# Patient Record
Sex: Female | Born: 1950 | Race: White | Hispanic: No | Marital: Married | Smoking: Never smoker
Health system: Southern US, Community
[De-identification: ages and names within clinical notes are randomized; demographics above are authoritative.]

## PROBLEM LIST (undated history)

## (undated) DIAGNOSIS — E7800 Pure hypercholesterolemia, unspecified: Secondary | ICD-10-CM

## (undated) DIAGNOSIS — G43909 Migraine, unspecified, not intractable, without status migrainosus: Secondary | ICD-10-CM

## (undated) DIAGNOSIS — E785 Hyperlipidemia, unspecified: Secondary | ICD-10-CM

## (undated) DIAGNOSIS — F329 Major depressive disorder, single episode, unspecified: Secondary | ICD-10-CM

## (undated) DIAGNOSIS — Z9049 Acquired absence of other specified parts of digestive tract: Secondary | ICD-10-CM

## (undated) DIAGNOSIS — G44209 Tension-type headache, unspecified, not intractable: Secondary | ICD-10-CM

## (undated) DIAGNOSIS — K219 Gastro-esophageal reflux disease without esophagitis: Secondary | ICD-10-CM

## (undated) DIAGNOSIS — G709 Myoneural disorder, unspecified: Secondary | ICD-10-CM

## (undated) DIAGNOSIS — M84453A Pathological fracture, unspecified femur, initial encounter for fracture: Secondary | ICD-10-CM

## (undated) DIAGNOSIS — M797 Fibromyalgia: Secondary | ICD-10-CM

## (undated) DIAGNOSIS — Z9289 Personal history of other medical treatment: Secondary | ICD-10-CM

## (undated) DIAGNOSIS — I10 Essential (primary) hypertension: Secondary | ICD-10-CM

## (undated) DIAGNOSIS — M1710 Unilateral primary osteoarthritis, unspecified knee: Secondary | ICD-10-CM

## (undated) DIAGNOSIS — I82409 Acute embolism and thrombosis of unspecified deep veins of unspecified lower extremity: Secondary | ICD-10-CM

## (undated) DIAGNOSIS — F32A Depression, unspecified: Secondary | ICD-10-CM

## (undated) DIAGNOSIS — S83249A Other tear of medial meniscus, current injury, unspecified knee, initial encounter: Secondary | ICD-10-CM

## (undated) DIAGNOSIS — R079 Chest pain, unspecified: Secondary | ICD-10-CM

## (undated) DIAGNOSIS — IMO0001 Reserved for inherently not codable concepts without codable children: Secondary | ICD-10-CM

## (undated) DIAGNOSIS — R1310 Dysphagia, unspecified: Secondary | ICD-10-CM

## (undated) DIAGNOSIS — E669 Obesity, unspecified: Secondary | ICD-10-CM

## (undated) DIAGNOSIS — L570 Actinic keratosis: Secondary | ICD-10-CM

## (undated) DIAGNOSIS — N3281 Overactive bladder: Secondary | ICD-10-CM

## (undated) DIAGNOSIS — K5792 Diverticulitis of intestine, part unspecified, without perforation or abscess without bleeding: Secondary | ICD-10-CM

## (undated) HISTORY — DX: Migraine, unspecified, not intractable, without status migrainosus: G43.909

## (undated) HISTORY — DX: Reserved for inherently not codable concepts without codable children: IMO0001

## (undated) HISTORY — DX: Personal history of other medical treatment: Z92.89

## (undated) HISTORY — DX: Other tear of medial meniscus, current injury, unspecified knee, initial encounter: S83.249A

## (undated) HISTORY — DX: Major depressive disorder, single episode, unspecified: F32.9

## (undated) HISTORY — PX: CARDIAC CATHETERIZATION: SHX172

## (undated) HISTORY — DX: Pure hypercholesterolemia, unspecified: E78.00

## (undated) HISTORY — DX: Myoneural disorder, unspecified: G70.9

## (undated) HISTORY — PX: ABDOMINAL HYSTERECTOMY: SHX81

## (undated) HISTORY — DX: Essential (primary) hypertension: I10

## (undated) HISTORY — DX: Dysphagia, unspecified: R13.10

## (undated) HISTORY — DX: Depression, unspecified: F32.A

## (undated) HISTORY — PX: SHOULDER SURGERY: SHX246

## (undated) HISTORY — DX: Chest pain, unspecified: R07.9

## (undated) HISTORY — DX: Tension-type headache, unspecified, not intractable: G44.209

## (undated) HISTORY — DX: Fibromyalgia: M79.7

## (undated) HISTORY — DX: Hyperlipidemia, unspecified: E78.5

## (undated) HISTORY — DX: Gastro-esophageal reflux disease without esophagitis: K21.9

## (undated) HISTORY — DX: Acquired absence of other specified parts of digestive tract: Z90.49

## (undated) HISTORY — DX: Diverticulitis of intestine, part unspecified, without perforation or abscess without bleeding: K57.92

## (undated) HISTORY — DX: Actinic keratosis: L57.0

## (undated) HISTORY — DX: Acute embolism and thrombosis of unspecified deep veins of unspecified lower extremity: I82.409

## (undated) HISTORY — DX: Pathological fracture, unspecified femur, initial encounter for fracture: M84.453A

## (undated) HISTORY — PX: COLON RESECTION: SHX5231

## (undated) HISTORY — DX: Overactive bladder: N32.81

## (undated) HISTORY — PX: KNEE SURGERY: SHX244

## (undated) HISTORY — DX: Obesity, unspecified: E66.9

## (undated) HISTORY — DX: Unilateral primary osteoarthritis, unspecified knee: M17.10

## (undated) HISTORY — PX: REVISION TOTAL HIP ARTHROPLASTY: SHX766

## (undated) HISTORY — PX: BREAST BIOPSY: SHX20

---

## 1993-07-06 HISTORY — PX: CARDIAC CATHETERIZATION: SHX172

## 1999-07-07 HISTORY — PX: CARDIAC CATHETERIZATION: SHX172

## 2004-04-05 ENCOUNTER — Encounter: Payer: Self-pay | Admitting: Orthopedic Surgery

## 2004-05-06 ENCOUNTER — Encounter: Payer: Self-pay | Admitting: Orthopedic Surgery

## 2004-07-06 HISTORY — PX: CARDIAC CATHETERIZATION: SHX172

## 2004-09-23 ENCOUNTER — Inpatient Hospital Stay (HOSPITAL_COMMUNITY): Admission: EM | Admit: 2004-09-23 | Discharge: 2004-09-24 | Payer: Self-pay | Admitting: *Deleted

## 2004-09-23 ENCOUNTER — Ambulatory Visit: Payer: Self-pay | Admitting: Cardiovascular Disease

## 2004-09-27 ENCOUNTER — Inpatient Hospital Stay (HOSPITAL_COMMUNITY): Admission: EM | Admit: 2004-09-27 | Discharge: 2004-10-01 | Payer: Self-pay | Admitting: Emergency Medicine

## 2004-10-22 ENCOUNTER — Ambulatory Visit: Payer: Self-pay | Admitting: Cardiology

## 2005-08-12 ENCOUNTER — Inpatient Hospital Stay: Payer: Self-pay | Admitting: Internal Medicine

## 2005-10-19 ENCOUNTER — Ambulatory Visit: Payer: Self-pay | Admitting: Unknown Physician Specialty

## 2006-08-13 ENCOUNTER — Other Ambulatory Visit: Payer: Self-pay

## 2006-08-13 ENCOUNTER — Inpatient Hospital Stay: Payer: Self-pay | Admitting: Internal Medicine

## 2006-08-30 ENCOUNTER — Ambulatory Visit: Payer: Self-pay | Admitting: Gastroenterology

## 2006-09-03 ENCOUNTER — Ambulatory Visit: Payer: Self-pay | Admitting: Gastroenterology

## 2007-09-21 ENCOUNTER — Other Ambulatory Visit: Payer: Self-pay

## 2007-09-21 ENCOUNTER — Inpatient Hospital Stay: Payer: Self-pay | Admitting: Internal Medicine

## 2007-09-26 ENCOUNTER — Ambulatory Visit: Payer: Self-pay | Admitting: Unknown Physician Specialty

## 2007-12-14 ENCOUNTER — Ambulatory Visit: Payer: Self-pay | Admitting: Specialist

## 2007-12-21 ENCOUNTER — Ambulatory Visit: Payer: Self-pay | Admitting: Specialist

## 2008-02-24 ENCOUNTER — Other Ambulatory Visit: Payer: Self-pay

## 2008-02-25 ENCOUNTER — Inpatient Hospital Stay: Payer: Self-pay | Admitting: Internal Medicine

## 2009-09-09 ENCOUNTER — Ambulatory Visit: Payer: Self-pay | Admitting: Internal Medicine

## 2010-07-05 ENCOUNTER — Ambulatory Visit: Payer: Self-pay | Admitting: Internal Medicine

## 2010-07-05 ENCOUNTER — Emergency Department: Payer: Self-pay | Admitting: Emergency Medicine

## 2010-07-10 ENCOUNTER — Emergency Department: Payer: Self-pay | Admitting: Psychiatry

## 2010-11-29 ENCOUNTER — Emergency Department: Payer: Self-pay | Admitting: Emergency Medicine

## 2010-12-15 ENCOUNTER — Emergency Department: Payer: Self-pay | Admitting: Unknown Physician Specialty

## 2011-01-16 ENCOUNTER — Ambulatory Visit: Payer: Self-pay | Admitting: Unknown Physician Specialty

## 2011-01-20 LAB — PATHOLOGY REPORT

## 2011-04-06 DIAGNOSIS — N3281 Overactive bladder: Secondary | ICD-10-CM | POA: Insufficient documentation

## 2011-04-06 DIAGNOSIS — M797 Fibromyalgia: Secondary | ICD-10-CM | POA: Insufficient documentation

## 2011-04-06 DIAGNOSIS — E782 Mixed hyperlipidemia: Secondary | ICD-10-CM | POA: Insufficient documentation

## 2011-04-06 DIAGNOSIS — G44209 Tension-type headache, unspecified, not intractable: Secondary | ICD-10-CM | POA: Insufficient documentation

## 2011-04-06 DIAGNOSIS — F32A Depression, unspecified: Secondary | ICD-10-CM | POA: Insufficient documentation

## 2011-04-06 DIAGNOSIS — E1169 Type 2 diabetes mellitus with other specified complication: Secondary | ICD-10-CM | POA: Insufficient documentation

## 2011-04-06 DIAGNOSIS — F329 Major depressive disorder, single episode, unspecified: Secondary | ICD-10-CM | POA: Insufficient documentation

## 2011-04-06 DIAGNOSIS — G43909 Migraine, unspecified, not intractable, without status migrainosus: Secondary | ICD-10-CM | POA: Insufficient documentation

## 2011-05-17 ENCOUNTER — Observation Stay: Payer: Self-pay | Admitting: Internal Medicine

## 2011-12-21 ENCOUNTER — Ambulatory Visit: Payer: Self-pay | Admitting: Family Medicine

## 2011-12-22 DIAGNOSIS — M7062 Trochanteric bursitis, left hip: Secondary | ICD-10-CM | POA: Insufficient documentation

## 2012-01-14 DIAGNOSIS — M25559 Pain in unspecified hip: Secondary | ICD-10-CM | POA: Insufficient documentation

## 2012-02-24 ENCOUNTER — Ambulatory Visit: Payer: Self-pay | Admitting: Family Medicine

## 2012-04-12 DIAGNOSIS — G709 Myoneural disorder, unspecified: Secondary | ICD-10-CM | POA: Insufficient documentation

## 2012-06-13 ENCOUNTER — Ambulatory Visit: Payer: Self-pay | Admitting: Family Medicine

## 2012-06-13 LAB — COMPREHENSIVE METABOLIC PANEL
Albumin: 3.2 g/dL — ABNORMAL LOW (ref 3.4–5.0)
Alkaline Phosphatase: 120 U/L (ref 50–136)
Calcium, Total: 9.3 mg/dL (ref 8.5–10.1)
EGFR (Non-African Amer.): >60
Glucose: 111 mg/dL — ABNORMAL HIGH (ref 65–99)
SGOT(AST): 21 U/L (ref 15–37)
SGPT (ALT): 32 U/L (ref 12–78)

## 2012-06-13 LAB — CBC WITH DIFFERENTIAL/PLATELET
Basophil #: 0.1 10*3/uL (ref 0.0–0.1)
Eosinophil #: 0.1 10*3/uL (ref 0.0–0.7)
Lymphocyte #: 2 10*3/uL (ref 1.0–3.6)
MCHC: 34.4 g/dL (ref 32.0–36.0)
MCV: 92 fL (ref 80–100)
Monocyte %: 6.1 %
Neutrophil #: 14.6 10*3/uL — ABNORMAL HIGH (ref 1.4–6.5)
Platelet: 245 10*3/uL (ref 150–440)
RDW: 13.7 % (ref 11.5–14.5)
WBC: 17.9 10*3/uL — ABNORMAL HIGH (ref 3.6–11.0)

## 2012-06-13 LAB — LIPASE, BLOOD: Lipase: 117 U/L (ref 73–393)

## 2012-06-14 ENCOUNTER — Inpatient Hospital Stay: Payer: Self-pay | Admitting: Surgery

## 2012-06-15 LAB — BASIC METABOLIC PANEL
Anion Gap: 7 (ref 7–16)
BUN: 6 mg/dL — ABNORMAL LOW (ref 7–18)
Chloride: 107 mmol/L (ref 98–107)
Creatinine: 0.91 mg/dL (ref 0.60–1.30)
EGFR (Non-African Amer.): >60
Glucose: 146 mg/dL — ABNORMAL HIGH (ref 65–99)
Osmolality: 283 (ref 275–301)
Potassium: 4.8 mmol/L (ref 3.5–5.1)

## 2012-06-15 LAB — CBC WITH DIFFERENTIAL/PLATELET
Basophil %: 0.5 %
Eosinophil %: 1.1 %
HGB: 12.5 g/dL (ref 12.0–16.0)
MCH: 32.2 pg (ref 26.0–34.0)
Monocyte #: 0.8 10*3/uL (ref 0.2–1.0)
Neutrophil %: 74.1 %
RBC: 3.86 10*6/uL — ABNORMAL LOW (ref 4.40–5.90)

## 2012-06-16 LAB — CBC WITH DIFFERENTIAL/PLATELET
Basophil %: 0.5 %
Eosinophil #: 0.1 10*3/uL (ref 0.0–0.7)
Eosinophil %: 1.1 %
HGB: 12.2 g/dL (ref 12.0–16.0)
Lymphocyte %: 11.9 %
MCHC: 33.8 g/dL (ref 32.0–36.0)
Neutrophil %: 78.7 %
RBC: 3.91 10*6/uL — ABNORMAL LOW (ref 4.40–5.90)
WBC: 11.9 10*3/uL — ABNORMAL HIGH (ref 3.8–10.6)

## 2012-06-17 LAB — CBC WITH DIFFERENTIAL/PLATELET
Basophil #: 0.1 10*3/uL (ref 0.0–0.1)
Eosinophil #: 0.2 10*3/uL (ref 0.0–0.7)
Eosinophil %: 1.8 %
MCH: 32.9 pg (ref 26.0–34.0)
Monocyte #: 0.6 10*3/uL (ref 0.2–1.0)
Monocyte %: 7.7 %
Neutrophil %: 68.9 %
Platelet: 256 10*3/uL (ref 150–440)
RBC: 3.83 10*6/uL — ABNORMAL LOW (ref 4.40–5.90)
RDW: 13.8 % (ref 11.5–14.5)

## 2012-06-17 LAB — BASIC METABOLIC PANEL
Anion Gap: 4 — ABNORMAL LOW (ref 7–16)
Calcium, Total: 8.6 mg/dL (ref 8.5–10.1)
Co2: 28 mmol/L (ref 21–32)
Creatinine: 0.75 mg/dL (ref 0.60–1.30)

## 2012-06-18 LAB — CBC WITH DIFFERENTIAL/PLATELET
Basophil %: 0.2 %
Eosinophil #: 0 10*3/uL (ref 0.0–0.7)
Eosinophil %: 0.2 %
HGB: 12.8 g/dL (ref 12.0–16.0)
Lymphocyte #: 0.9 10*3/uL — ABNORMAL LOW (ref 1.0–3.6)
Lymphocyte %: 8.1 %
MCHC: 34.8 g/dL (ref 32.0–36.0)
Monocyte #: 0.7 10*3/uL (ref 0.2–1.0)
Neutrophil #: 9.4 10*3/uL — ABNORMAL HIGH (ref 1.4–6.5)
Neutrophil %: 85.2 %
Platelet: 268 10*3/uL (ref 150–440)
RDW: 14 % (ref 11.5–14.5)

## 2012-06-18 LAB — BASIC METABOLIC PANEL
Anion Gap: 7 (ref 7–16)
BUN: 3 mg/dL — ABNORMAL LOW (ref 7–18)
Chloride: 107 mmol/L (ref 98–107)
Creatinine: 0.82 mg/dL (ref 0.60–1.30)
Glucose: 154 mg/dL — ABNORMAL HIGH (ref 65–99)
Potassium: 4.1 mmol/L (ref 3.5–5.1)

## 2012-06-19 LAB — CBC WITH DIFFERENTIAL/PLATELET
Basophil #: 0.1 10*3/uL (ref 0.0–0.1)
Lymphocyte %: 9.2 %
Monocyte %: 7 %
Platelet: 263 10*3/uL (ref 150–440)
RDW: 14 % (ref 11.5–14.5)
WBC: 16.9 10*3/uL — ABNORMAL HIGH (ref 3.8–10.6)

## 2012-06-21 LAB — BASIC METABOLIC PANEL
Anion Gap: 5 — ABNORMAL LOW (ref 7–16)
Calcium, Total: 8.5 mg/dL (ref 8.5–10.1)
Chloride: 106 mmol/L (ref 98–107)
Co2: 28 mmol/L (ref 21–32)
Creatinine: 0.7 mg/dL (ref 0.60–1.30)

## 2012-06-21 LAB — CBC WITH DIFFERENTIAL/PLATELET
Basophil %: 0.5 %
Eosinophil #: 0.5 10*3/uL (ref 0.0–0.7)
HCT: 31.3 % — ABNORMAL LOW (ref 40.0–52.0)
HGB: 11.2 g/dL — ABNORMAL LOW (ref 12.0–16.0)
Lymphocyte #: 1.1 10*3/uL (ref 1.0–3.6)
MCH: 33.1 pg (ref 26.0–34.0)
MCHC: 35.7 g/dL (ref 32.0–36.0)
Neutrophil #: 8.5 10*3/uL — ABNORMAL HIGH (ref 1.4–6.5)

## 2012-06-23 LAB — CBC WITH DIFFERENTIAL/PLATELET
Basophil %: 0.7 %
Eosinophil #: 0.5 10*3/uL (ref 0.0–0.7)
Eosinophil %: 7 %
HCT: 32.6 % — ABNORMAL LOW (ref 40.0–52.0)
HGB: 11.1 g/dL — ABNORMAL LOW (ref 12.0–16.0)
MCH: 31.3 pg (ref 26.0–34.0)
MCHC: 34 g/dL (ref 32.0–36.0)
MCV: 92 fL (ref 80–100)
Monocyte #: 0.7 10*3/uL (ref 0.2–1.0)
Neutrophil #: 4.5 10*3/uL (ref 1.4–6.5)
RBC: 3.54 10*6/uL — ABNORMAL LOW (ref 4.40–5.90)

## 2012-06-23 LAB — BASIC METABOLIC PANEL
Anion Gap: 7 (ref 7–16)
BUN: 2 mg/dL — ABNORMAL LOW (ref 7–18)
Calcium, Total: 8.7 mg/dL (ref 8.5–10.1)
Glucose: 113 mg/dL — ABNORMAL HIGH (ref 65–99)
Osmolality: 278 (ref 275–301)
Potassium: 4 mmol/L (ref 3.5–5.1)

## 2012-07-13 ENCOUNTER — Inpatient Hospital Stay: Payer: Self-pay | Admitting: Surgery

## 2012-07-13 LAB — TSH: Thyroid Stimulating Horm: 1.02 u[IU]/mL

## 2012-07-13 LAB — PROTIME-INR: Prothrombin Time: 13.8 secs (ref 11.5–14.7)

## 2012-07-14 LAB — BASIC METABOLIC PANEL
Anion Gap: 9 (ref 7–16)
BUN: 11 mg/dL (ref 7–18)
Calcium, Total: 8.7 mg/dL (ref 8.5–10.1)
Creatinine: 0.8 mg/dL (ref 0.60–1.30)
Osmolality: 285 (ref 275–301)
Sodium: 142 mmol/L (ref 136–145)

## 2012-07-14 LAB — CBC WITH DIFFERENTIAL/PLATELET
Basophil %: 0.8 %
Eosinophil #: 0.7 10*3/uL (ref 0.0–0.7)
Eosinophil %: 8.9 %
HCT: 37.2 % — ABNORMAL LOW (ref 40.0–52.0)
Lymphocyte #: 1.6 10*3/uL (ref 1.0–3.6)
MCH: 30.8 pg (ref 26.0–34.0)
Platelet: 189 10*3/uL (ref 150–440)

## 2012-07-26 DIAGNOSIS — I82409 Acute embolism and thrombosis of unspecified deep veins of unspecified lower extremity: Secondary | ICD-10-CM | POA: Insufficient documentation

## 2012-07-26 DIAGNOSIS — K5792 Diverticulitis of intestine, part unspecified, without perforation or abscess without bleeding: Secondary | ICD-10-CM | POA: Insufficient documentation

## 2012-08-24 ENCOUNTER — Other Ambulatory Visit: Payer: Self-pay | Admitting: Surgery

## 2012-08-24 LAB — URINALYSIS, COMPLETE
Ketone: NEGATIVE
Ph: 5 (ref 4.5–8.0)
RBC,UR: 1 /HPF (ref 0–5)
Squamous Epithelial: <1
WBC UR: 2 /HPF (ref 0–5)

## 2013-07-25 DIAGNOSIS — Z9049 Acquired absence of other specified parts of digestive tract: Secondary | ICD-10-CM | POA: Insufficient documentation

## 2013-08-28 ENCOUNTER — Ambulatory Visit (INDEPENDENT_AMBULATORY_CARE_PROVIDER_SITE_OTHER): Payer: Medicare Other

## 2013-08-28 ENCOUNTER — Ambulatory Visit (INDEPENDENT_AMBULATORY_CARE_PROVIDER_SITE_OTHER): Payer: Medicare Other | Admitting: Podiatry

## 2013-08-28 ENCOUNTER — Encounter: Payer: Self-pay | Admitting: Podiatry

## 2013-08-28 VITALS — BP 120/75 | HR 76 | Resp 16 | Ht 63.0 in | Wt 222.0 lb

## 2013-08-28 DIAGNOSIS — M79609 Pain in unspecified limb: Secondary | ICD-10-CM

## 2013-08-28 DIAGNOSIS — Q828 Other specified congenital malformations of skin: Secondary | ICD-10-CM

## 2013-08-28 DIAGNOSIS — M79675 Pain in left toe(s): Secondary | ICD-10-CM

## 2013-08-28 DIAGNOSIS — M2040 Other hammer toe(s) (acquired), unspecified foot: Secondary | ICD-10-CM

## 2013-08-28 NOTE — Progress Notes (Signed)
   Subjective:    Patient ID: Misty Hebert, female    DOB: August 25, 1950, 63 y.o.   MRN: [id number]  HPI Comments: Left #4 toe has a corn on the toe that is very sore and becomes very painful when wearing socks and shoes. Its been flared up now for about 6 weeks. It has been about 12 years since having it trimmed.   Toe Pain       Review of Systems  All other systems reviewed and are negative.       Objective:   Physical Exam: Vital signs are stable she is alert and oriented x3. I have reviewed her past medical history medications allergies surgeries and social history. Pulses are palpable bilateral. Neurologic sensorium is intact. Deep tendon reflexes are intact bilateral. Muscle strength is 5 over 5 dorsiflexors plantar flexors inverters everters all intrinsic musculature is intact. Orthopedic evaluation demonstrates hammertoe deformities bilateral. Reactive hyperkeratosis noted medial aspect of the fifth digit of the left foot and lateral aspect of the fourth digit left foot. These are porokeratotic lesions associated with both proximity of the toes and irritation with shoe gear.        Assessment & Plan:  Assessment: Corns and calluses between the fourth and fifth digits of the left foot.  Plan: Debridement of the reactive hyperkeratosis and padding was placed. Samples of padding was provided.

## 2013-10-04 DIAGNOSIS — M1710 Unilateral primary osteoarthritis, unspecified knee: Secondary | ICD-10-CM | POA: Insufficient documentation

## 2014-01-22 ENCOUNTER — Ambulatory Visit: Payer: Self-pay | Admitting: Unknown Physician Specialty

## 2014-01-25 LAB — PATHOLOGY REPORT

## 2014-02-14 ENCOUNTER — Encounter: Payer: Self-pay | Admitting: Orthopedic Surgery

## 2014-02-19 ENCOUNTER — Ambulatory Visit: Payer: Self-pay | Admitting: Physician Assistant

## 2014-03-06 ENCOUNTER — Encounter: Payer: Self-pay | Admitting: Orthopedic Surgery

## 2014-10-15 ENCOUNTER — Ambulatory Visit (INDEPENDENT_AMBULATORY_CARE_PROVIDER_SITE_OTHER): Payer: Medicare Other

## 2014-10-15 ENCOUNTER — Ambulatory Visit (INDEPENDENT_AMBULATORY_CARE_PROVIDER_SITE_OTHER): Payer: Medicare Other | Admitting: Podiatry

## 2014-10-15 ENCOUNTER — Ambulatory Visit: Payer: Medicare Other | Admitting: Podiatry

## 2014-10-15 ENCOUNTER — Encounter: Payer: Self-pay | Admitting: Podiatry

## 2014-10-15 VITALS — BP 162/101 | HR 82 | Resp 18 | Ht 63.0 in | Wt 232.0 lb

## 2014-10-15 DIAGNOSIS — M19072 Primary osteoarthritis, left ankle and foot: Secondary | ICD-10-CM

## 2014-10-15 DIAGNOSIS — M79672 Pain in left foot: Secondary | ICD-10-CM

## 2014-10-15 MED ORDER — MELOXICAM 7.5 MG PO TABS: 7.5000 mg | ORAL_TABLET | Freq: Every day | ORAL | Status: DC

## 2014-10-15 NOTE — Progress Notes (Signed)
Patient ID: Misty Hebert, female   DOB: 1951-01-08, 64 y.o.   MRN: [id number]  Subjective: 64 year old female presents the office today with complaints of pain to the her left foot. She states it is ongoing for several weeks and it actually has improved. She denies any history of injury or trauma to the area. She denies any overlying swelling or redness. She states that she has pain to the area after she's been standing or putting pressure to the area. She denies any recent increase or change in her activity. No other complaints at this time.  Objective: AAO 3, NAD DP/PT pulses palpable, CRT less than 3 seconds Protective sensation intact with Simms Weinstein monofilament, vibratory sensation intact, Achilles tendon reflex intact There is tenderness palpation along the proximal aspect of the second and third third metatarsal cuneiform joint articulation. There is no specific area pinpoint bony tenderness and there is no pain with vibratory sensation overlying the area. There is no overlying edema, erythema, increase in warmth. There is no other areas of tenderness to the foot or ankle bilaterally. MMT 5/5, ROM WNL No open lesions or pre-ulcer lesions identified bilaterally.  No pain with calf compression, swelling, warmth, erythema.  Assessment: 64 year old female with left foot pain, likely arthritis  Plan: -X-rays were obtained and reviewed with the patient. -Treatment options were discussed the patient include alternatives, risks, complications -At this time discussed possible steroid injection however we will hold off on this time. -Prescribed meloxicam. Discussed side effects the medication directed to stop if any are to occur call the office -Ice to the area -Discussed shoe gear modifications -Follow up in 2 weeks if symptoms are not resolved worsening. Otherwise, follow up in 4 weeks. If symptoms continue we'll repeat x-rays to rule out stress fracture. In the meantime occurs  call the office with any questions or concerns.

## 2014-10-23 NOTE — H&P (Signed)
Subjective/Chief Complaint 3 days LLQ abdominal pain    History of Present Illness 64 year old female with 3 days of worsening LLQ, mild nausea.  Two previous episodes of similar but less intense pain back in Aug and Oct 2013 both treated with oral abx.  PCP is Gayland Curry at [facility].  Over course of weekend pain much worse,  Seen in PCP office earlier today and recieved IM abx and CT scan done at 2 pm showed marked diverticular changes in sigmoid colona and small focal area of perforation.    Past History see below. Cardiac cath times 4 all negative for CAD by patient report. kidney stones cholelithiasis. hiatal hernia. multiple orthopeadic procedures  recent left knee surgery this Summer at [facility]    Primary Physician Astrid Divine.   Past Med/Surgical Hx:  diverticulitis:   Abdominal Pain:   kidney stone:   fibromyalgia:   Hypertension:   Gastric Reflux:   Hyperlipidemia:   Tubal Ligation:   Left Eye Surgery:   Sinus Surgery:   Appendectomy:   Cholecystectomy:   capal tunnel:   cervical fussion:   Hysterectomy - Partial:   ALLERGIES:  Celebrex: GI Distress  Thorazine: Other  Reglan: Other  Levaquin: Itching, Rash, Swelling    Other Allergies none   HOME MEDICATIONS: Medication Instructions Status  Protonix 40 mg oral delayed release tablet 1 tab(s) orally 2 times a day Active  atorvastatin 40 mg oral tablet 1 tab(s) orally once a day (at bedtime) Active  fluoxetine 20 mg oral tablet 1 tab(s) orally once a day Active  lisinopril 20 mg oral tablet 1 tab(s) orally once a day Active     Medications none   Family and Social History:   Family History Non-Contributory    Social History negative tobacco, negative ETOH, negative Illicit drugs    Place of Living Home   Review of Systems:   Subjective/Chief Complaint see above    Abdominal Pain Yes    Nausea/Vomiting Yes    Tolerating Diet No  Nauseated     Medications/Allergies Reviewed Medications/Allergies reviewed   Physical Exam:   GEN no acute distress, obese, temp 98.2 p 90 bp 117/92    HEENT pale conjunctivae    NECK supple    RESP normal resp effort  clear BS    CARD regular rate    ABD positive tenderness  soft  focal peritoneal signs in LLQ.    LYMPH negative neck    SKIN normal to palpation    NEURO cranial nerves intact    PSYCH A+O to time, place, person   Radiology Results: CT:    09-Dec-13 14:02, CT Abdomen and Pelvis With Contrast   CT Abdomen and Pelvis With Contrast   REASON FOR EXAM:    CALL REPORT [id number] LABS First Acute Abd Pain Hx   Diverticulitis  COMMENTS:       PROCEDURE: MCT - MCT ABDOMEN / PELVIS W  - Jun 13 2012  2:02PM     RESULT: Comparison:  02/24/2012    Technique: Multiple axial images of the abdomen and pelvis were performed   from the lung bases to the pubic symphysis, with p.o. contrast and with   100 mL of Isovue 300 intravenous contrast.    Findings:  Mild basilar opacities are likely secondary to atelectasis.  The liver is slightly low in attenuation, raising the possibility of   hepatic steatosis. The gallbladder is  not seen, and likely surgically   absent. The spleen, adrenals, and pancreas are unremarkable. There is a   possible small hiatal hernia. The kidneys enhance normally.    The patient is status post hysterectomy. There is focal bowel wall   thickening of the sigmoid colon in the left lower quadrant in a region of   diverticulosis. There is a small adjacent multilobulated fluid collection   which measures approximately 2.0 x 1.6 cm in greatest axial dimension.   The findings are concerning for acute diverticulitis with a small   adjacent abscess versus contained perforation. These changes are in the   region of previous diverticulitis, but are greater in degree than prior.    No aggressive lytic or sclerotic osseous lesions are identified.  There is a small  focus of air in the subcutaneous fat of the right   buttock. This is nonspecific, but may related to recent injection.   Correlate clinically.    IMPRESSION:   There are findings of acute diverticulitis in the sigmoid colon. There is   a small adjacent multilobulated fluid collection concerning for small   abscess or contained perforation. Followup colonoscopy is recommended   after the acute episode, as malignancy can have a similar appearance.    This was called to the ordering clinician immediately after the dictation.        Verified By: Gregor Hams, M.D., MD     Assessment/Admission Diagnosis 64 year old female with sigmoid diverticulitis with contained perforation.  Previous history of two other episodes of diverticulitis treated as outpatient.    Plan admit, clears, IV hydration.  IV rocephin and flagyl serial exams   Electronic Signatures: Sherri Rad (MD)  (Signed 10-Dec-13 00:12)  Authored: CHIEF COMPLAINT and HISTORY, PAST MEDICAL/SURGIAL HISTORY, ALLERGIES, Other Allergies, HOME MEDICATIONS, OTHER MEDICATIONS, FAMILY AND SOCIAL HISTORY, REVIEW OF SYSTEMS, PHYSICAL EXAM, Radiology, ASSESSMENT AND PLAN   Last Updated: 10-Dec-13 00:12 by Sherri Rad (MD)

## 2014-10-23 NOTE — Discharge Summary (Signed)
PATIENT NAME:  Misty Hebert, SANNES MR#:  [id number] DATE OF BIRTH:  1951/04/30  DATE OF ADMISSION:  06/14/2012 DATE OF DISCHARGE:  06/24/2012  BRIEF HISTORY: The patient is a 64 year old woman admitted to the hospital on 06/14/2012 with an episode of what appeared to be recurrent diverticulitis. She had worsening left lower quadrant pain associated with mild nausea. CT scan revealed diverticular change in the sigmoid colon and a small area of microperforation. The patient in the hospital was placed on IV antibiotics with the plan of pursuing a nonoperative course. However, over the next several days, she did not improve with continued discomfort in the left lower quadrant area. Her discomfort increased, white blood cell count remained elevated and CT scan on repeat demonstrated evidence of increasing extraluminal air. On the 13th, she was taken to surgery where she underwent a colectomy. Reanastomosis was performed. She had no significant intraoperative problems. She had very slow return of bowel function but continued to improve and was ready for discharge with normal bowel function and tolerating a regular diet by the 20th.   DISCHARGE MEDICATIONS: Included Protonix 40 mg p.o. daily, atorvastatin 40 mg once a day, fluoxetine 20 mg once a day, lisinopril 20 mg once a day, with the additional medication of Norco 5/325 p.o. q.6 hours p.r.n.   FINAL DISCHARGE DIAGNOSIS: Diverticulitis with microperforation.   SURGERY: Sigmoid colectomy.    ____________________________ [name], MD rle:cs D: 07/02/2012 19:53:00 ET T: 07/03/2012 19:35:37 ET JOB#: [id number]  cc: [name], MD, <Dictator> Floria Raveling. Astrid Divine, MD Rodena Goldmann MD ELECTRONICALLY SIGNED 07/04/2012 0:05

## 2014-10-23 NOTE — H&P (Signed)
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 She has had two previous episodes in the past which were similar but less intense dating back in August and October 2013, both treated with oral antibiotics as an outpatient. Her primary care physician is Dr. Gayland Curry. Over the course of the weekend the patient began having more abdominal pain in the left lower quadrant. This was proceeded on Thursday with loose diarrhea stool. She was seen in her primary care physicians office earlier today, received intramuscular antibiotic and referred to the Emergency Room following a CT scan done earlier this afternoon demonstrated marked diverticular changes within the sigmoid colon and a small focal area of perforation. Of note, the patient has not had a recent colonoscopy.   ALLERGIES: Celebrex, Levaquin, Reglan and Thorazine.   PAST MEDICAL HISTORY:  1. Kidney stones. 2. Cholelithiasis. 3. Hiatal hernia.  4. Multiple orthopedic procedures in the past.  5. Hypertension.  6. Diverticulitis.  7. Fibromyalgia.  8. Hypertension. 9. Gastroesophageal reflux disease.  10. Hyperlipidemia.   PAST SURGICAL HISTORY:  1. History of cardiac catheterization 4 times all of which have been negative for coronary artery disease by the patient's report. 2. Open cholecystectomy as a young woman. 3. Multiple orthopedic procedures, most recently left knee arthroscopic surgery done in the summer at [facility].  4. Partial hysterectomy.  5. Appendectomy.  6. Sinus surgery.  7. Left eye surgery.  8. Tubal ligation.   MEDICATIONS AT  HOME:  1. Protonix 40 mg by mouth b.i.d.  2. Atorvastatin 40 mg by mouth once a day. 3. Fluoxetine 20 mg by mouth once a day. 4. Lisinopril 20 mg by mouth once a day.   FAMILY HISTORY: Noncontributory.   SOCIAL HISTORY: The patient is married. Does not drink. Does not smoke. 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   REVIEW OF SYSTEMS: Significant for nausea, abdominal pain, malaise, recurrent symptoms. Remaining ten-point review unremarkable.   PHYSICAL EXAMINATION:  VITAL SIGNS: Temperature 98.2, pulse 90 and regular, blood pressure is 117/92.   HEENT: Head is atraumatic, normocephalic. Extraocular muscles are intact. Conjunctivae are clear.   NECK: Supple. No adenopathy or thyromegaly. There is a scar on the neck from previous surgical manipulation.   HEART: Regular rate and rhythm.   LUNGS: Clear bilaterally.   ABDOMEN: Obese, soft and there are multiple scars. There is marked tenderness in a focal area in the left lower quadrant consistent with the CT scan findings as described above.   EXTREMITIES: Warm and well perfused.    NEUROLOGIC: Grossly normal.   MUSCULOSKELETAL: Grossly normal.   PSYCHIATRIC: Alert and oriented x4.   LABORATORY, DIAGNOSTIC AND RADIOLOGICAL DATA: WBC count 17.9, hemoglobin 14.1, hematocrit 40.9, platelet count 245,000. Liver function tests are normal except for serum albumin at 3.2, lipase 117, amylase 32, BUN 11, creatinine 0.98, sodium 141, potassium 4.4, chloride 104, CO2 31, total calcium 9.3.      A CT scan was performed with oral and intravenous contrast. There is previous hysterectomy, gallbladder is surgically absent. Spleen, adrenals and pancreas are normal, kidneys are normal. Small hiatal hernia. In the sigmoid colon there is a focal bowel  wall thickening in the left lower quadrant. There is an adjacent multiloculated fluid collection measuring 2.1 x 1.6 cm in greatest axial dimensions. This represents likely a contained microperforation.    IMPRESSION: 64 year old white female with recurrent sigmoid diverticulitis and now with what appears to be a contained perforation. She does not appears toxic. She does have some focal peritoneal signs and as such will be admitted to the hospital for intravenous antibiotics, clear liquid diet, serial abdominal examination and WBC count. I discussed with her the possibility of this being able to be treated with antibiotics followed by an elective outpatient procedure involving sigmoid colectomy. If she did not improve with antibiotic therapy then surgical intervention will be entertained earlier. If elective surgery is undertaken preoperative colonoscopy will be required. Patient understands and wishes to proceed with admission and this course of plan.   TOTAL TIME SPENT: 60 minutes.   ____________________________ Jeannette How Marina Gravel, MD mab:cms D: 06/15/2012 20:49:00 ET T: 06/16/2012 06:20:15 ET JOB#: [id number]  cc: Elta Guadeloupe A. Marina Gravel, MD, <Dictator> cc Gayland Curry. [place], [place]. Kimela Malstrom A Trana Ressler MD ELECTRONICALLY SIGNED 06/16/2012 7:17

## 2014-10-23 NOTE — Op Note (Signed)
PATIENT NAME:  Misty Hebert, Misty Hebert MR#:  [id number] DATE OF BIRTH:  1950-08-09  DATE OF PROCEDURE:  06/17/2012  PREOPERATIVE DIAGNOSIS: Diverticular abscess with perforation.   POSTOPERATIVE DIAGNOSIS: Diverticular abscess with perforation.   OPERATION: Sigmoid colectomy.   SURGEON: Rodena Goldmann, III, MD   ASSISTANT: Dr. Genevive Bi  ANESTHESIA: General with epidural anesthesia.    OPERATIVE PROCEDURE: With the patient in the supine position after the induction of appropriate general anesthesia and placement of a Foley catheter and epidural catheter, the patient's abdomen was prepped with ChloraPrep and draped with sterile towels. Alcohol wipe and Betadine-impregnated Steri-Drape were utilized. A midline incision was made just above the umbilicus to the suprapubic area and carried down through the subcutaneous tissue with Bovie electrocautery. The midline fascia was identified and opened with a  skin incision, as was the peritoneum. The bowel was retracted in the right upper quadrant. There did appear to be an area of inflammatory change in the proximal sigmoid colon which was adherent to the anterior abdominal wall. It was taken down with a combination of blunt and Bovie dissection. It appeared to be an area of about 10 cm that was involved with diverticular disease without any evidence of other significant diverticulitis. Because of the more proximal sigmoid position, I felt that mobilization of the left colon was appropriate, so we did a full mobilization of the splenic flexure using the LigaSure apparatus. The bowel was mobilized to the distal transverse colon. The bowel was divided in the midsigmoid colon using the Contour device, and the mesentery was taken down with the LigaSure device. Distal descending colon was divided with the contour stapling device and the specimen passed off the table. It was opened in the Operating Room. The bowel was mobilized and two loops of bowel were placed side by side. The  distal bowel was cleaned and a pursestring clamp placed across the bowel. Pursestring suture of 2-0 nylon was placed through the pursestring clamp and the bowel opened. It was sized to an EEA 25. The bowel was quite small and would not tolerate a bigger stapler. An EEA 25 stapling device brought to the table and the anvil removed. It was then inserted into the distal bowel and secured with a pursestring clamp. The proximal bowel was brought close to the distal bowel and a longitudinal colotomy made in the bowel. The body of the stapler was inserted through the colotomy and the spike driven out through the staple line. The anvil was married to the body of the stapler and they were approximated and fired. The rings were intact. Manual palpation of the anastomosis did not reveal any defects. The proximal colotomy was closed transversely using two applications of the stapling TX-30 stapling device carrying a blue load. Appendices epiploica were then placed over both the anastomosis and the colotomy and secured in place with 3-0 silk. The anastomosis was completely covered, as was the colotomy. The bowel was then returned to their anatomic position. The area was copiously suctioned and irrigated. There did not appear to be any significant bleeding, all of which appeared to be well controlled. The retractors were withdrawn and the omentum placed over the bowel. The midline fascia was closed with a running suture of loop PDS tied in the middle and buried. A Penrose drain was placed in the depths of the skin incision and the skin clipped. Sterile dressings were applied. The patient was returned to the recovery room having tolerated the procedure well. Sponge, instrument, and needle  counts were correct x 2 in the Operating Room.  ____________________________ [name], MD rle:cbb D: 06/17/2012 15:45:03 ET T: 06/17/2012 18:17:22 ET JOB#: [id number]  cc: [name], MD, <Dictator> Floria Raveling. Astrid Divine,  MD Rodena Goldmann MD ELECTRONICALLY SIGNED 06/20/2012 21:37

## 2014-10-26 ENCOUNTER — Emergency Department
Admit: 2014-10-26 | Disposition: A | Discharge status: Home / Self Care | Payer: Self-pay | Admitting: Emergency Medicine

## 2014-10-26 LAB — COMPREHENSIVE METABOLIC PANEL
ALBUMIN: 4.2 g/dL
ALK PHOS: 127 U/L — AB
ALT: 25 U/L
Anion Gap: 10 (ref 7–16)
BUN: 13 mg/dL
Bilirubin,Total: 0.4 mg/dL
CO2: 25 mmol/L
Calcium, Total: 9.1 mg/dL
Chloride: 102 mmol/L
Creatinine: 1.02 mg/dL — ABNORMAL HIGH
EGFR (African American): >60
EGFR (Non-African Amer.): 58 — ABNORMAL LOW
Glucose: 174 mg/dL — ABNORMAL HIGH
POTASSIUM: 4 mmol/L
SGOT(AST): 27 U/L
Sodium: 137 mmol/L
Total Protein: 7.4 g/dL

## 2014-10-26 LAB — CBC WITH DIFFERENTIAL/PLATELET
Basophil #: 0.1 10*3/uL (ref 0.0–0.1)
Basophil %: 0.8 %
EOS ABS: 0.4 10*3/uL (ref 0.0–0.7)
EOS PCT: 3.8 %
HCT: 43.3 % (ref 35.0–47.0)
HGB: 14.8 g/dL (ref 12.0–16.0)
LYMPHS ABS: 2.5 10*3/uL (ref 1.0–3.6)
Lymphocyte %: 24.1 %
MCH: 31.7 pg (ref 26.0–34.0)
MCHC: 34.2 g/dL (ref 32.0–36.0)
MCV: 93 fL (ref 80–100)
MONO ABS: 0.7 x10 3/mm (ref 0.2–0.9)
Monocyte %: 7.1 %
NEUTROS PCT: 64.2 %
Neutrophil #: 6.7 10*3/uL — ABNORMAL HIGH (ref 1.4–6.5)
PLATELETS: 242 10*3/uL (ref 150–440)
RBC: 4.67 10*6/uL (ref 3.80–5.20)
RDW: 13.5 % (ref 11.5–14.5)
WBC: 10.4 10*3/uL (ref 3.6–11.0)

## 2014-10-26 LAB — TROPONIN I

## 2014-10-26 LAB — LIPASE, BLOOD: Lipase: 49 U/L

## 2014-10-26 NOTE — Consult Note (Signed)
PATIENT NAME:  Misty Hebert, Misty Hebert MR#:  [id number] DATE OF BIRTH:  21-Feb-1951  DATE OF CONSULTATION:  07/13/2012  REFERRING PHYSICIAN:  Rodena Goldmann, MD CONSULTING PHYSICIAN:  Belia Heman. Verdell Carmine, MD PRIMARY CARE PHYSICIAN: Floria Raveling. Astrid Divine, MD  REASON FOR CONSULTATION: Medical management, acute right lower extremity DVT.   HISTORY OF PRESENT ILLNESS: This is a 64 year old female who presented to the hospital due to right lower extremity swelling and redness and noted to have an acute right lower extremity DVT. The patient was in the hospital about 2 to 3 weeks ago for a diverticular abscess and is status post colectomy. Postoperatively, she was doing well at home. Was seen at Dr. Rolin Barry office and had staples removed about a week or so ago. At that time, she was still complaining of some right lower extremity pain, but she did not notify anybody at the office. Her pains got significantly worse and therefore, she was referred for a right lower extremity on ultrasound, which was positive for a DVT. She is being admitted for further treatment. The patient denies any chest pain. She denies any shortness of breath. She denies any fevers, chills, cough, nausea, vomiting, abdominal pain, any other associated symptoms. The patient does say that she has had a DVT and possibly a suspected PE many years ago, but she cannot recall when. She did say that she was on blood thinners for a little while. She also had a daughter who passed away from an acute pulmonary embolism.   REVIEW OF SYSTEMS:  CONSTITUTIONAL: No documented fever. No weight gain or weight loss.  EYES: No blurred or double vision.  ENT: Denies postnasal drip. No redness of the oropharynx.  RESPIRATORY: No cough, no wheeze, no hemoptysis, no dyspnea.  CARDIOVASCULAR: No chest pain, no orthopnea, no palpitations, no syncope.  GASTROINTESTINAL: No nausea, no vomiting, no diarrhea, no abdominal pain, no melena, no hematochezia.  GENITOURINARY: No  dysuria or hematuria.  ENDOCRINE: No polyuria or nocturia. No heat or heat or cold intolerance.  HEMATOLOGIC: No anemia, no bruising, no bleeding.  INTEGUMENTARY: No rashes. No lesions.  MUSCULOSKELETAL: No arthritis, no swelling, no gout.  NEUROLOGIC: No numbness, no tingling, no ataxia, no seizure-type activity.  PSYCHIATRIC: No anxiety, no insomnia, no ADD.   PAST MEDICAL HISTORY: Consistent with hypertension, hyperlipidemia, depression, GERD, history of diverticular abscess, status post colectomy.   ALLERGIES: AMBIEN, WHICH CAUSES AGITATION; CELEBREX, GI DISTRESS; LEVAQUIN, WHICH CAUSES RASH AND ITCHING; REGLAN AND THORAZINE.   SOCIAL HISTORY: No smoking. No alcohol abuse. No illicit drug abuse. Lives at home with her husband.   FAMILY HISTORY: The patient's mother is alive. She had a history of MI and also history of a stroke. She has a Administrator. Her father passed away from lung cancer.   CURRENT MEDICATIONS: Atorvastatin 40 mg at bedtime, fluoxetine 20 mg daily, lisinopril 20 mg daily, Norco 5/325 one tab q.4 h. as needed, Protonix 40 mg b.i.d.   PHYSICAL EXAMINATION:   VITAL SIGNS: Temperature 97.5, pulse 114, respirations 20, blood pressure 139/96, sats 94% on room air.  GENERAL: She is a pleasant-appearing female, a bit anxious but in no apparent distress.  HEENT: Atraumatic, normocephalic. Extraocular muscles are intact. Pupils equal, round, reactive to light. Sclerae anicteric. No conjunctival injection. No pharyngeal erythema.  NECK: Supple. There is no jugular venous distention, no bruits, no lymphadenopathy, no thyromegaly.  HEART: Regular rate and rhythm, tachycardic. No murmurs, no rubs, no clicks.  LUNGS: Clear to auscultation bilaterally. No rales,  no rhonchi, no wheezes.  ABDOMEN: Soft, flat, nontender, nondistended. Has good bowel sounds. No hepatosplenomegaly appreciated.  EXTREMITIES: No evidence of any cyanosis or clubbing. She has +1 to 2 pitting edema  from the right lower extremity as compared to the left, +2 pedal and radial pulses bilaterally. Her right lower extremity is a bit warm to touch.  SKIN: Moist and warm with no rashes appreciated.  LYMPHATIC: There is no cervical or axillary lymphadenopathy.  NEUROLOGIC: She is alert, awake, oriented x 3 with no focal motor or sensory deficits appreciated bilaterally.   LABORATORY, DIAGNOSTIC AND RADIOLOGICAL DATA: Laboratory exam is still pending. She had a right lower extremity ultrasound, which shows findings for DVT in the distal superficial femoral and popliteal vein of the right leg.   ASSESSMENT AND PLAN: This is a 64 year old female with a history of hypertension, hyperlipidemia, gastroesophageal reflux disease, depression, history of suspected deep vein thrombosis and pulmonary embolism, who recently had a diverticular abscess, status post colectomy, presented to the hospital with right lower extremity swelling and pain and noted to have an acute right lower extremity deep vein thrombosis.  1.  Acute right lower extremity deep vein thrombosis. Questionable if this is postoperative related deep vein thrombosis versus underlying thrombophilia. The patient does have a previous history of deep vein thrombosis and pulmonary embolism supposedly and also had a daughter who died from a pulmonary embolism. Therefore, I will go ahead and order a thrombophilia workup for now. Will start her on Lovenox after the blood is drawn. She can also be transitioned to Xarelto until her thrombophilia workup comes back. If she truly does have a thrombophilia, then she likely needs to be taken off Xarelto and put on lifelong anticoagulation with warfarin.  2.  Hypertension. I will continue with the lisinopril. She is presently hemodynamically stable.  3.  Gastroesophageal reflux disease. I will continue with her Protonix. 4.  Depression. Continue fluoxetine.  5.  Hyperlipidemia. Continue atorvastatin.   CODE STATUS:  The patient is a full code.   TIME SPENT: 45 minutes.   Thank you so much for the consultation. Will follow along with you.    ____________________________ Belia Heman. Verdell Carmine, MD vjs:jm D: 07/13/2012 18:59:12 ET T: 07/13/2012 19:35:34 ET JOB#: [id number]  cc: Belia Heman. Verdell Carmine, MD, <Dictator> Henreitta Leber MD ELECTRONICALLY SIGNED 07/19/2012 15:34

## 2014-10-26 NOTE — Discharge Summary (Signed)
PATIENT NAME:  Misty Hebert, Misty Hebert MR#:  [id number] DATE OF BIRTH:  [birth year]  DATE OF ADMISSION:  07/13/2012 DATE OF DISCHARGE:  07/17/2012  BRIEF HISTORY: The patient is a 64 year old woman referred back to the office with complaints of right lower extremity swelling and tenderness. She is several weeks out from a colon resection. She did have venous prophylaxis during the time she was hospitalized. She was discharged home without anticoagulation with the instructions to ambulate and increase her activity regularly. She presented back to the office 10 days ago with no complaints of lower extremity discomfort. She called the office on the day of admission complaining that her leg was bothering her more and she was referred urgently to the ultrasound suite for evaluation. Venous ultrasound did reveal popliteal and superficial femoral vein thrombosis. She was admitted urgently to the hospital and placed on Lovenox. Internal medicine service saw her in consultation and suggested we put her on Xarelto. She was placed on that medication that evening and her Lovenox was stopped the next day. Physical therapy was then instituted. She had a significant amount of difficulty ambulating at first because of the pain in her right leg. She has slowly improved and is up active with no complaints at the present time. Her abdominal wound looks good with no sign of any infection.   DISCHARGE MEDICATIONS:  Protonix 40 mg p.o. b.i.d., atorvastatin 40 mg p.o. at bedtime, fluoxetine 20 mg p.o. once a day, lisinopril 20 mg once a day, Norco 5/325 mg every 4 hours p.r.n., Xarelto 15 mg p.o. b.i.d., magnesium hydroxide 30 mEq once a day p.r.n.   FINAL DISCHARGE DIAGNOSIS:  Deep venous thrombosis.    ____________________________ [name], MD rle:si D: 07/17/2012 12:16:17 ET T: 07/17/2012 23:51:53 ET JOB#: [id number]  cc: [name], MD, <Dictator> Floria Raveling. Astrid Divine, MD  Rodena Goldmann MD ELECTRONICALLY SIGNED  07/22/2012 8:48

## 2014-10-26 NOTE — Consult Note (Signed)
Brief Consult Note: Diagnosis: 1. Acute RLE DVT 2. HTN 3. Hyperlipidemia 4. GERD 5. Depression 6. ?? hx of DVT/PE.   Patient was seen by consultant.   Consult note dictated.   Orders entered.   Discussed with Attending MD.   Comments: 64 yo female w/ hx of HTN, hyperlipidemia, GERd, DEpression, ?? hx of DVT/PE who recently had a diverticular abscess s/p colectomy came into hospital with RLE swelling pain and noted to have RLE DVt.   1. Acute RLE DVT - ?? post-op related (vs) thrombophilia as pt. has hx of DVT/PE and had a daughter that died of PE.   - will order thrombophilia work up. - agree w/ Starting Lovenox for now and can transition to [place] later.    2. HTN - cont. Lisinopril 3. GERD - cont. Protonix.  4. Depression - cont. Fluoxetine 5. Hyperlipidemia - cont. Atorvastatin  Full Code Job # [id number].  Electronic Signatures: Henreitta Leber (MD)  (Signed 08-Jan-14 18:57)  Authored: Brief Consult Note   Last Updated: 08-Jan-14 18:57 by Henreitta Leber (MD)

## 2014-10-27 LAB — URINALYSIS, COMPLETE
Bacteria: NONE SEEN
Bilirubin,UR: NEGATIVE
Blood: NEGATIVE
Glucose,UR: NEGATIVE mg/dL
Ketone: NEGATIVE
Leukocyte Esterase: NEGATIVE
Nitrite: NEGATIVE
Ph: 5
Protein: NEGATIVE
Specific Gravity: 1.046
Squamous Epithelial: NONE SEEN

## 2014-10-27 LAB — LACTIC ACID, PLASMA: Lactic Acid, Venous: 1.9 mmol/L

## 2014-11-13 ENCOUNTER — Ambulatory Visit: Payer: Medicare Other | Admitting: Podiatry

## 2014-12-14 ENCOUNTER — Ambulatory Visit (INDEPENDENT_AMBULATORY_CARE_PROVIDER_SITE_OTHER): Payer: Medicare Other | Admitting: Podiatry

## 2014-12-14 VITALS — BP 132/80 | HR 86 | Resp 16

## 2014-12-14 DIAGNOSIS — M7752 Other enthesopathy of left foot: Secondary | ICD-10-CM | POA: Diagnosis not present

## 2014-12-14 DIAGNOSIS — M19072 Primary osteoarthritis, left ankle and foot: Secondary | ICD-10-CM | POA: Diagnosis not present

## 2014-12-14 DIAGNOSIS — M779 Enthesopathy, unspecified: Secondary | ICD-10-CM

## 2014-12-14 DIAGNOSIS — M778 Other enthesopathies, not elsewhere classified: Secondary | ICD-10-CM

## 2014-12-14 MED ORDER — METHYLPREDNISOLONE 4 MG PO TBPK: ORAL_TABLET | ORAL | Status: DC

## 2014-12-14 NOTE — Progress Notes (Signed)
Today for follow-up of pain to her left foot. I have reviewed her past medical history medications and allergies. Denies changes to her past medical history. Denies problems of the left foot.  Objective: Pulses are palpable left. She has pain on palpation in range of motion of the second and third metatarsophalangeal joint left foot. I reviewed evaluated her old radiographs today demonstrating capsulitis. History of plantar fasciitis is also noted.  Assessment: Capsulitis second metatarsophalangeal joint left foot consider plantar fasciitis left.  Plan: Injected today with dexamethasone and local anesthesia second metatarsophalangeal joint area left placed her in a Darco shoe. Dispensed a prescription for Medrol Dosepak she cannot take meloxicam.

## 2015-01-09 ENCOUNTER — Ambulatory Visit (INDEPENDENT_AMBULATORY_CARE_PROVIDER_SITE_OTHER): Payer: Medicare Other | Admitting: Podiatry

## 2015-01-09 DIAGNOSIS — M779 Enthesopathy, unspecified: Principal | ICD-10-CM

## 2015-01-09 DIAGNOSIS — M7752 Other enthesopathy of left foot: Secondary | ICD-10-CM | POA: Diagnosis not present

## 2015-01-09 DIAGNOSIS — M778 Other enthesopathies, not elsewhere classified: Secondary | ICD-10-CM

## 2015-01-09 NOTE — Progress Notes (Signed)
She presents today for follow-up of her capsulitis second metatarsophalangeal joint of her left foot. She states that the last injection only lasted as long as a local and aesthetic good. She states that we have to do something about this, it has started to limit my daily activities. Ready for surgery of his necessary.  Objective: Vital signs are stable she is alert and oriented 3. I have reviewed her past mental history medications allergy surgery social history. She has swelling and pain overlying the dorsal aspect and on end range of motion of the second metatarsal phalangeal joint left foot. No hammertoe currently noted. Pulses are strongly palpable left foot.  Assessment: Plantarflexed second metatarsal resulting in capsulitis left foot.  Plan: Discussed etiology pathology conservative versus surgical therapies. At this point we have decided to perform a second metatarsal osteotomy with double screw fixation. I answered all the questions regarding these procedures to the best of my ability in layman's terms. She understands it is amenable to it and signed all 3 patient the consent form. We did discuss possible postop complications which may include but are not limited to postop pain bleeding swelling infection recurrence need for further surgery blood clots also limb loss of life.  We dispensed a cam walker for her postop. And I will follow-up with her in the near future for surgical intervention.

## 2015-01-11 ENCOUNTER — Telehealth: Payer: Self-pay | Admitting: *Deleted

## 2015-01-11 NOTE — Telephone Encounter (Signed)
Need to reschedule surgery from July 29 to Feb 08 2015

## 2015-01-21 ENCOUNTER — Ambulatory Visit: Payer: Medicare Other | Admitting: Podiatry

## 2015-01-30 ENCOUNTER — Other Ambulatory Visit: Payer: Self-pay | Admitting: Family Medicine

## 2015-01-30 ENCOUNTER — Ambulatory Visit
Admission: RE | Admit: 2015-01-30 | Discharge: 2015-01-30 | Disposition: A | Discharge status: Home / Self Care | Payer: Medicare Other | Source: Ambulatory Visit | Attending: Family Medicine | Admitting: Family Medicine

## 2015-01-30 DIAGNOSIS — Z1239 Encounter for other screening for malignant neoplasm of breast: Secondary | ICD-10-CM

## 2015-01-30 DIAGNOSIS — Z1231 Encounter for screening mammogram for malignant neoplasm of breast: Secondary | ICD-10-CM | POA: Insufficient documentation

## 2015-01-31 ENCOUNTER — Other Ambulatory Visit: Payer: Self-pay | Admitting: Podiatry

## 2015-01-31 MED ORDER — CEPHALEXIN 500 MG PO CAPS: 500.0000 mg | ORAL_CAPSULE | Freq: Three times a day (TID) | ORAL | Status: DC

## 2015-01-31 MED ORDER — OXYCODONE-ACETAMINOPHEN 10-325 MG PO TABS: 1.0000 | ORAL_TABLET | Freq: Four times a day (QID) | ORAL | Status: DC | PRN

## 2015-02-01 ENCOUNTER — Encounter: Payer: Self-pay | Admitting: Podiatry

## 2015-02-01 DIAGNOSIS — M21542 Acquired clubfoot, left foot: Secondary | ICD-10-CM | POA: Diagnosis not present

## 2015-02-06 ENCOUNTER — Encounter: Payer: Self-pay | Admitting: Podiatry

## 2015-02-06 ENCOUNTER — Ambulatory Visit (INDEPENDENT_AMBULATORY_CARE_PROVIDER_SITE_OTHER): Payer: Medicare Other | Admitting: Podiatry

## 2015-02-06 ENCOUNTER — Ambulatory Visit (INDEPENDENT_AMBULATORY_CARE_PROVIDER_SITE_OTHER): Payer: Medicare Other

## 2015-02-06 VITALS — BP 111/84 | HR 85 | Temp 98.4°F | Resp 16

## 2015-02-06 DIAGNOSIS — M19072 Primary osteoarthritis, left ankle and foot: Secondary | ICD-10-CM

## 2015-02-06 DIAGNOSIS — Z9889 Other specified postprocedural states: Secondary | ICD-10-CM

## 2015-02-06 NOTE — Progress Notes (Signed)
She presents today 1 week status post second metatarsal osteotomy left foot. She states it is still quite tender. She denies fever chills nausea vomiting muscle aches and pains. She states that she got the dressing wet this morning as she tried to shower.  Objective: Vital signs are stable. Dressing intact was soaking wet. Once removed demonstrates palpable pulse. Margins are well coapted sutures are intact ecchymosis overlying the surgical site. No signs of infection at this point. Radiograph reveals good position of the second metatarsal with double screw fixation left foot.  Assessment: Well-healing surgical osteotomy second left.  Plan: Redress today dry sterile compressive dressing follow-up with her in a little more than a week for suture removal.

## 2015-02-13 ENCOUNTER — Encounter: Payer: Self-pay | Admitting: Podiatry

## 2015-02-13 NOTE — Progress Notes (Signed)
DOS 02/01/2015 left 2nd metatarsal osteotomy with screws.

## 2015-02-18 ENCOUNTER — Ambulatory Visit (INDEPENDENT_AMBULATORY_CARE_PROVIDER_SITE_OTHER): Payer: Medicare Other | Admitting: Podiatry

## 2015-02-18 DIAGNOSIS — Z9889 Other specified postprocedural states: Secondary | ICD-10-CM

## 2015-02-18 NOTE — Progress Notes (Signed)
She presents today for a postop visit status post second metatarsal osteotomy left foot. Date of surgery 02/01/2015. She denies fever chills nausea vomiting muscle aches pains. She states this seems to be doing very well.  Objective: Vital signs are stable she is alert and oriented 3. Pulses are strongly palpable. Sutures were removed today margins are well coapted toe appears to be sitting in a rectus position with weightbearing. No signs of infection.  Assessment: Well-healing surgical foot left.  Plan: Discussed etiology pathology conservative versus surgical therapies. At this point she will start wearing a Darco shoe that she has at home. And I put her in a compression anklet today which she is to wear during the daytime and take off at night. I will follow-up with her in 2 weeks for another set of x-rays.

## 2015-03-04 ENCOUNTER — Ambulatory Visit (INDEPENDENT_AMBULATORY_CARE_PROVIDER_SITE_OTHER): Payer: Medicare Other

## 2015-03-04 ENCOUNTER — Encounter: Payer: Self-pay | Admitting: Podiatry

## 2015-03-04 ENCOUNTER — Ambulatory Visit (INDEPENDENT_AMBULATORY_CARE_PROVIDER_SITE_OTHER): Payer: Medicare Other | Admitting: Podiatry

## 2015-03-04 VITALS — BP 134/82 | HR 76 | Resp 16

## 2015-03-04 DIAGNOSIS — M779 Enthesopathy, unspecified: Principal | ICD-10-CM

## 2015-03-04 DIAGNOSIS — M19072 Primary osteoarthritis, left ankle and foot: Secondary | ICD-10-CM

## 2015-03-04 DIAGNOSIS — M778 Other enthesopathies, not elsewhere classified: Secondary | ICD-10-CM

## 2015-03-04 DIAGNOSIS — Z9889 Other specified postprocedural states: Secondary | ICD-10-CM

## 2015-03-04 DIAGNOSIS — M7752 Other enthesopathy of left foot: Secondary | ICD-10-CM

## 2015-03-04 NOTE — Progress Notes (Signed)
She presents today for postop visit date of surgery 02/01/2015. She is status post metatarsal osteotomy with hammertoe repair. She states this seems to be doing pretty well. She would like to wear shoes such as flip flops or sandals.  Objective: Vital signs are stable she is alert and oriented 3. Pulses are strongly palpable. Mild edema overlying surgical skull site of the right foot that appears to be healing quite nicely. 3 views of the right foot taken in the office today demonstrate only a well-healing second metatarsal osteotomy with double screw fixation. Cutaneous evaluation of Mr.'s well-healing scar no erythema or edema cellulitis drainage or odor other than the aforementioned edema.  Assessment: Second metatarsal osteotomy healing well right foot.  Plan: I will allow her this point to get back to tennis shoes or some type of enclosed shoe but not heel. I highly recommended that she not wear flip-flops or sandals. I will follow-up with her in 1 month for another set of x-rays.  Roselind Messier DPM

## 2015-04-08 ENCOUNTER — Encounter: Payer: Self-pay | Admitting: Podiatry

## 2015-04-08 ENCOUNTER — Ambulatory Visit (INDEPENDENT_AMBULATORY_CARE_PROVIDER_SITE_OTHER): Payer: Medicare Other | Admitting: Podiatry

## 2015-04-08 ENCOUNTER — Ambulatory Visit (INDEPENDENT_AMBULATORY_CARE_PROVIDER_SITE_OTHER): Payer: Medicare Other

## 2015-04-08 VITALS — BP 128/91 | HR 83 | Resp 16

## 2015-04-08 DIAGNOSIS — Z9889 Other specified postprocedural states: Secondary | ICD-10-CM

## 2015-04-08 DIAGNOSIS — M7752 Other enthesopathy of left foot: Secondary | ICD-10-CM | POA: Diagnosis not present

## 2015-04-08 DIAGNOSIS — M779 Enthesopathy, unspecified: Principal | ICD-10-CM

## 2015-04-08 DIAGNOSIS — M778 Other enthesopathies, not elsewhere classified: Secondary | ICD-10-CM

## 2015-04-08 NOTE — Progress Notes (Signed)
She presents today for follow-up of her second metatarsal osteotomy 02/01/2015. She states that he was doing just great until the other day when I was walking and walking and walking [place]. She states that again to her and even this toe is hurting and she points to the third toe left foot. Denies any trauma to the foot other than walking.  Objective: I'll signs are stable she is alert and oriented 3. Pulses are strong and palpable bilateral. Neurologic sensorium is intact versus resting monofilament. She has some tenderness on palpation of the second metatarsophalangeal joint but the majority of the pain is located over the third and fourth metatarsophalangeal joints of the left foot. No palpable Mulder's click is noted. Radiographs demonstrate a well healing surgical foot. Osteotomy is in good position and the screws an good with good compression have gone heal the osteotomy site.   Assessment: well-healing surgical foot left.  Plan: I encouraged her to get and wear a pair of good solid soled shoes. I expressed to her that this would take time to resolve and that it will go away in the near future. I will follow up with her in 4-6 weeks.

## 2015-05-13 ENCOUNTER — Ambulatory Visit (INDEPENDENT_AMBULATORY_CARE_PROVIDER_SITE_OTHER): Payer: Medicare Other | Admitting: Podiatry

## 2015-05-13 ENCOUNTER — Encounter: Payer: Self-pay | Admitting: Podiatry

## 2015-05-13 VITALS — BP 126/84 | HR 86 | Resp 16

## 2015-05-13 DIAGNOSIS — M779 Enthesopathy, unspecified: Principal | ICD-10-CM

## 2015-05-13 DIAGNOSIS — Z9889 Other specified postprocedural states: Secondary | ICD-10-CM

## 2015-05-13 DIAGNOSIS — M7752 Other enthesopathy of left foot: Secondary | ICD-10-CM | POA: Diagnosis not present

## 2015-05-13 DIAGNOSIS — M778 Other enthesopathies, not elsewhere classified: Secondary | ICD-10-CM

## 2015-05-13 NOTE — Progress Notes (Signed)
She presents today for follow-up of her second metatarsal osteotomy left foot. She states is doing very well and she is very happy with the outcome.  Objective: Vital signs are stable she's alert and oriented 3. Pulses are palpable. She has great range of motion of the second metatarsophalangeal joint of the left foot scar has gone on to heal uneventfully. No signs of infection.  Assessment well-healing surgical foot left.  Plan: Follow up with me as needed. Remember to thank her again for the sausage.  Misty Hebert DPM

## 2015-06-13 ENCOUNTER — Ambulatory Visit: Payer: 59 | Admitting: Physical Therapy

## 2015-06-18 ENCOUNTER — Ambulatory Visit: Payer: 59 | Admitting: Physical Therapy

## 2015-07-09 ENCOUNTER — Ambulatory Visit (INDEPENDENT_AMBULATORY_CARE_PROVIDER_SITE_OTHER): Payer: Medicare Other | Admitting: Podiatry

## 2015-07-09 ENCOUNTER — Ambulatory Visit (INDEPENDENT_AMBULATORY_CARE_PROVIDER_SITE_OTHER): Payer: Medicare Other

## 2015-07-09 ENCOUNTER — Other Ambulatory Visit: Payer: Self-pay | Admitting: Podiatry

## 2015-07-09 ENCOUNTER — Encounter: Payer: Self-pay | Admitting: Podiatry

## 2015-07-09 VITALS — BP 145/74 | HR 77 | Resp 16

## 2015-07-09 DIAGNOSIS — M779 Enthesopathy, unspecified: Secondary | ICD-10-CM

## 2015-07-09 DIAGNOSIS — M79672 Pain in left foot: Secondary | ICD-10-CM

## 2015-07-09 DIAGNOSIS — S92302D Fracture of unspecified metatarsal bone(s), left foot, subsequent encounter for fracture with routine healing: Secondary | ICD-10-CM

## 2015-07-09 DIAGNOSIS — M778 Other enthesopathies, not elsewhere classified: Secondary | ICD-10-CM

## 2015-07-09 DIAGNOSIS — M7752 Other enthesopathy of left foot: Secondary | ICD-10-CM | POA: Diagnosis not present

## 2015-07-09 NOTE — Progress Notes (Signed)
Subjective:     Patient ID: Misty Hebert, female   DOB: 1951-06-08, 65 y.o.   MRN: [id number]  HPI this patient presents the office with chief complaint of pain on the top of her left forefoot. She says she had previous foot surgery to the bone in her left forefoot. 0n 02/01/2015 by Dr. Milinda Pointer. She says she thought she was well and she wore heels to church 2 weeks ago.   pain has been severe since. She says that the pain is severe that it even wakes her up at night. She says she has taken ibuprofen as well as some leftover pain meds from her foot surgery to help control the pain. The pain continues. She does relate having swelling noted to her of her left forefoot, but the swelling is not present today Review of Systems     Objective:   Physical Exam GENERAL APPEARANCE: Alert, conversant. Appropriately groomed. No acute distress.  VASCULAR: Pedal pulses palpable at  [facility] and PT bilateral.  Capillary refill time is immediate to all digits,  Normal temperature gradient.  Digital hair growth is present bilateral  NEUROLOGIC: sensation is normal to 5.07 monofilament at 5/5 sites bilateral.  Light touch is intact bilateral, Muscle strength normal.  MUSCULOSKELETAL: acceptable muscle strength, tone and stability bilateral.  Intrinsic muscluature intact bilateral.  Rectus appearance of foot and digits noted bilateral. Palpable pain noted second and third metatarsal left foot.  Mild brownish discoloration over second metatarsal. No pain on ROM 2,3 MPJ.  DERMATOLOGIC: skin color, texture, and turgor are within normal limits.  No preulcerative lesions or ulcers  are seen, no interdigital maceration noted.  No open lesions present.  Digital nails are asymptomatic. No drainage noted.      Assessment:     Pain left foot   Possible stress fracture left foot.    Plan:     ROV.  Xray taken reveal no bony pathology.  Her screws are intact.  Treated her with unna boot and told to wear her surgical shoe for  10 days.  RTC 10 days.    Gardiner Barefoot DPM

## 2015-07-18 ENCOUNTER — Encounter: Payer: Self-pay | Admitting: Podiatry

## 2015-07-18 ENCOUNTER — Ambulatory Visit (INDEPENDENT_AMBULATORY_CARE_PROVIDER_SITE_OTHER): Payer: Medicare Other | Admitting: Podiatry

## 2015-07-18 VITALS — BP 139/99 | HR 78 | Resp 12

## 2015-07-18 DIAGNOSIS — M722 Plantar fascial fibromatosis: Secondary | ICD-10-CM

## 2015-07-18 MED ORDER — METHYLPREDNISOLONE 4 MG PO TBPK: ORAL_TABLET | ORAL | Status: DC

## 2015-07-18 NOTE — Progress Notes (Signed)
She presents today with pain to the lateral aspect and dorsal lateral aspect of the left foot. We performed surgery on the second metatarsophalangeal joint and toe back in July. As doing wonderfully she says but this pain across the dorsal aspect of her left foot is killing her.  Objective: Vital signs are stable she is alert and oriented 3 she has severe pain on palpation medially continue tubercle of the left heel pulses remain palpable. No calf pain.  Assessment: Plantar fasciitis with lateral compensatory syndrome left foot.  Plan: Injected the area today with Kenalog and local anesthetic.

## 2015-07-18 NOTE — Patient Instructions (Signed)

## 2015-08-21 ENCOUNTER — Ambulatory Visit: Payer: Medicare Other | Admitting: Podiatry

## 2015-09-09 ENCOUNTER — Ambulatory Visit: Payer: Medicare Other | Admitting: Podiatry

## 2015-09-18 ENCOUNTER — Encounter (INDEPENDENT_AMBULATORY_CARE_PROVIDER_SITE_OTHER): Payer: Medicare Other | Admitting: Podiatry

## 2015-09-18 NOTE — Progress Notes (Signed)
This encounter was created in error - please disregard.

## 2015-11-01 ENCOUNTER — Telehealth: Payer: Self-pay | Admitting: *Deleted

## 2015-11-01 ENCOUNTER — Other Ambulatory Visit: Payer: Self-pay | Admitting: Sports Medicine

## 2015-11-01 DIAGNOSIS — M79673 Pain in unspecified foot: Secondary | ICD-10-CM

## 2015-11-01 MED ORDER — HYDROCODONE-ACETAMINOPHEN 5-325 MG PO TABS: 1.0000 | ORAL_TABLET | Freq: Four times a day (QID) | ORAL | Status: DC | PRN

## 2015-11-01 NOTE — Telephone Encounter (Signed)
Script is at front desk here in [place] for pick for patient

## 2015-11-01 NOTE — Telephone Encounter (Addendum)
Pt states she called yesterday for an earlier appt with Dr. Milinda Pointer and has one for 11/05/2015, but would like pain medication to get her to the appt. Dr. Jacqualyn Posey ordered Vicodin 5/325mg  #15 one tablet every 8 hours prn foot pain. Left message instructing pt to pick up the rx in [place] due to her being in [place], and I would have the doctor there write the rx to be taken to the pharmacy.

## 2015-11-05 ENCOUNTER — Encounter: Payer: Self-pay | Admitting: Podiatry

## 2015-11-05 ENCOUNTER — Ambulatory Visit (INDEPENDENT_AMBULATORY_CARE_PROVIDER_SITE_OTHER): Payer: Medicare Other

## 2015-11-05 ENCOUNTER — Ambulatory Visit (INDEPENDENT_AMBULATORY_CARE_PROVIDER_SITE_OTHER): Payer: Medicare Other | Admitting: Podiatry

## 2015-11-05 VITALS — BP 168/97 | HR 74 | Resp 16

## 2015-11-05 DIAGNOSIS — M79672 Pain in left foot: Secondary | ICD-10-CM

## 2015-11-05 MED ORDER — PREDNISONE 10 MG (48) PO TBPK
ORAL_TABLET | Freq: Every day | ORAL | Status: DC
Start: 2015-11-05 — End: 2016-02-26

## 2015-11-05 NOTE — Progress Notes (Signed)
She presents today for follow-up of her plantar fasciitis of her left foot. She states that this has completely resolved however she has severe pain across the dorsal aspect of her left foot near where her surgery was performed September of last year. She states that she had been doing great for many months and now she is starting severe pain across the top of the foot. She states that she is under a lot of stress with her mother and other family issues.  Objective: Vital signs are stable she is alert and oriented 3 she has severe pain on palpation of the forefoot particularly between the interdigital spaces of the left foot and there is swelling. Pulses are palpable. She has good range of motion of the toe mild tenderness on end range of motion of the second metatarsophalangeal joint. Radiographs confirm swelling on lateral view as well as a mild contracted second metatarsophalangeal joint. No interdigital neuromas are noted. She has no pain on palpation medial calcaneal tubercle.  Assessment: Forefoot capsulitis metatarsalgia left foot.  Plan: I injected the area today with dexamethasone and local anesthetic across the dorsal aspect of the metatarsophalangeal joints. I placed her back in her cam walker or Darco shoe and prescribed a Medrol Dosepak. I will follow-up with her in a couple weeks to evaluate.  (She and her family or friends of my aunt.)

## 2015-11-18 ENCOUNTER — Ambulatory Visit: Payer: Medicare Other

## 2015-11-18 ENCOUNTER — Ambulatory Visit (INDEPENDENT_AMBULATORY_CARE_PROVIDER_SITE_OTHER): Payer: Medicare Other | Admitting: Podiatry

## 2015-11-18 ENCOUNTER — Encounter: Payer: Self-pay | Admitting: Podiatry

## 2015-11-18 VITALS — BP 147/86 | HR 66 | Resp 12

## 2015-11-18 DIAGNOSIS — M779 Enthesopathy, unspecified: Principal | ICD-10-CM

## 2015-11-18 DIAGNOSIS — M7752 Other enthesopathy of left foot: Secondary | ICD-10-CM

## 2015-11-18 DIAGNOSIS — M778 Other enthesopathies, not elsewhere classified: Secondary | ICD-10-CM

## 2015-11-18 DIAGNOSIS — M722 Plantar fascial fibromatosis: Secondary | ICD-10-CM

## 2015-11-18 NOTE — Progress Notes (Signed)
At this point she presents today for follow-up of her pain to the forefoot left. She states it is really not any better than the swelling has gone down.  Objective: Vital signs are stable alert and oriented 3. Pulses are palpable. She has severe pain on minimal palpation and range of motion of toes #2 and #3 of the left foot. Radiographs were taken again today just to rule out any stress fracture which there was none seen. Internal fixation to the second metatarsal appears to be in good position and the osteotomy site is healed 100%.  Assessment: Capsulitis possible painful internal fixation.  Plan: Reinjected her left second and third metatarsophalangeal joint area with dexamethasone. Placed her in an Haematologist and her Cam Walker. We'll follow up with her in a couple to 3 weeks to reassess. Remember to reassess her plantar fasciitis.

## 2015-12-04 ENCOUNTER — Encounter: Payer: Self-pay | Admitting: Podiatry

## 2015-12-04 ENCOUNTER — Telehealth: Payer: Self-pay | Admitting: *Deleted

## 2015-12-04 ENCOUNTER — Ambulatory Visit (INDEPENDENT_AMBULATORY_CARE_PROVIDER_SITE_OTHER): Payer: Medicare Other | Admitting: Podiatry

## 2015-12-04 VITALS — BP 158/84 | HR 80 | Resp 18

## 2015-12-04 DIAGNOSIS — M779 Enthesopathy, unspecified: Principal | ICD-10-CM

## 2015-12-04 DIAGNOSIS — M7752 Other enthesopathy of left foot: Secondary | ICD-10-CM

## 2015-12-04 DIAGNOSIS — M778 Other enthesopathies, not elsewhere classified: Secondary | ICD-10-CM

## 2015-12-04 NOTE — Progress Notes (Signed)
She presents today for follow-up of her second metatarsophalangeal joint capsulitis and neuritis. She states that steroids nor the Unna boot made any difference. She still has a lot of pain.  Objective: Vital signs are stable she is alert and oriented 3 pulses remain palpable she has range of motion of the toe but it is not painful until we compressed the joint itself. She has tenderness on palpation of either side of the joint itself. Mild edema is noted. No signs of infection. I reviewed her old radiographs demonstrate a well healing surgical osteotomy. 2 screws are intact.  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  Plan: I did express to her that physical therapy is her next conservative option and then possibly dehydrated alcohol injections. However I did explain that we may need to go look at the joint with her own to eyes once again.

## 2015-12-04 NOTE — Telephone Encounter (Signed)
Faxed PT orders to [facility].

## 2015-12-04 NOTE — Telephone Encounter (Signed)
Debbie - [facility] states fax copy of orders with doctor's signature, dx and orders.

## 2015-12-11 ENCOUNTER — Ambulatory Visit: Payer: Medicare Other | Attending: Podiatry | Admitting: Physical Therapy

## 2015-12-11 DIAGNOSIS — M6281 Muscle weakness (generalized): Secondary | ICD-10-CM | POA: Insufficient documentation

## 2015-12-11 DIAGNOSIS — M25672 Stiffness of left ankle, not elsewhere classified: Secondary | ICD-10-CM | POA: Insufficient documentation

## 2015-12-11 DIAGNOSIS — M25675 Stiffness of left foot, not elsewhere classified: Secondary | ICD-10-CM

## 2015-12-11 DIAGNOSIS — M79672 Pain in left foot: Secondary | ICD-10-CM | POA: Insufficient documentation

## 2015-12-11 DIAGNOSIS — R262 Difficulty in walking, not elsewhere classified: Secondary | ICD-10-CM | POA: Insufficient documentation

## 2015-12-11 DIAGNOSIS — M79675 Pain in left toe(s): Secondary | ICD-10-CM | POA: Insufficient documentation

## 2015-12-12 ENCOUNTER — Encounter: Payer: Self-pay | Admitting: Physical Therapy

## 2015-12-12 NOTE — Therapy (Deleted)
[facility] [address] [place], [place], [address] Phone: [phone]   Fax:  [phone]  Physical Therapy Evaluation  Patient Details  Name: Misty Hebert MRN: [id number] Date of Birth: 1951/03/15 Referring Provider: Dr. Milinda Pointer  Encounter Date: 2015-12-15      PT End of Session - 12/12/15 2120/10/08    Visit Number 1   Number of Visits 8   Date for PT Re-Evaluation 01/08/16   Authorization - Visit Number 1   Authorization - Number of Visits 10   Activity Tolerance Patient tolerated treatment well;Patient limited by pain   Behavior During Therapy [facility] for tasks assessed/performed      Past Medical History  Diagnosis Date  . Hypertension   . Reflux   . High cholesterol     Past Surgical History  Procedure Laterality Date  . Colon resection    . Knee surgery    . Shoulder surgery    . Breast biopsy Right     CYST REMOVED-16-32YRS AGO-NEG  . Abdominal hysterectomy      There were no vitals filed for this visit.           [facility] PT Assessment - 12/12/15 0001    Assessment   Medical Diagnosis L foot/toe capsulitis and pain    Referring Provider Dr. Milinda Pointer   Onset Date/Surgical Date 07/07/15       Manual: scar massage to L 2nd/3rd toe scar massage (very tender/ pain limited).  2nd/3rd toe LAD grade II-III 20 sec (5x).  There.ex.: see HEP.            Plan - 12/12/15 10/08/21    Clinical Impression Statement Pt. is a pleasant 65 y/o female with chronic c/o    Rehab Potential Fair   PT Frequency 2x / week   PT Duration 4 weeks   PT Treatment/Interventions Gait training;Stair training;Functional mobility training;Therapeutic activities;Cryotherapy;Iontophoresis 4mg /ml Dexamethasone;Moist Heat;Ultrasound;Patient/family education;Neuromuscular re-education;Balance training;Therapeutic exercise;Manual techniques;Dry needling;Scar mobilization;Passive range of motion   PT Next Visit Plan Increase scar/toe mobility  and pain mgmt.     PT Home Exercise Plan See handouts   Consulted and Agree with Plan of Care Patient      Patient will benefit from skilled therapeutic intervention in order to improve the following deficits and impairments:  Abnormal gait, Decreased strength, Difficulty walking, Impaired flexibility, Decreased balance, Decreased range of motion, Decreased skin integrity, Decreased scar mobility, Hypomobility, Pain  Visit Diagnosis: Pain in left toe(s)  Joint stiffness of toe, left  Difficulty in walking, not elsewhere classified  Pain in left foot  Muscle weakness (generalized)      G-Codes - 2015/12/15 08-Oct-2125    Functional Assessment Tool Used LEFS/ clinical impression/ pain/ joint stiffness/ muscle weakness   Functional Limitation Mobility: Walking and moving around   Mobility: Walking and Moving Around Current Status [id number]) At least 20 percent but less than 40 percent impaired, limited or restricted   Mobility: Walking and Moving Around Goal Status [phone]) At least 1 percent but less than 20 percent impaired, limited or restricted       Problem List There are no active problems to display for this patient.   Pura Spice 12/12/2015, 9:31 PM  [place] [facility] [address] [place], [place], [address] Phone: [phone]   Fax:  [phone]  Name: Misty Hebert MRN: [id number] Date of Birth: 04-Feb-1951

## 2015-12-17 ENCOUNTER — Ambulatory Visit: Payer: Medicare Other | Admitting: Physical Therapy

## 2015-12-17 DIAGNOSIS — R262 Difficulty in walking, not elsewhere classified: Secondary | ICD-10-CM

## 2015-12-17 DIAGNOSIS — M6281 Muscle weakness (generalized): Secondary | ICD-10-CM

## 2015-12-17 DIAGNOSIS — M79675 Pain in left toe(s): Secondary | ICD-10-CM

## 2015-12-17 DIAGNOSIS — M25675 Stiffness of left foot, not elsewhere classified: Secondary | ICD-10-CM

## 2015-12-17 DIAGNOSIS — M79672 Pain in left foot: Secondary | ICD-10-CM

## 2015-12-17 NOTE — Therapy (Signed)
[facility] [address]. [place], [place], [address] Phone: [phone]   Fax:  [phone]  Physical Therapy Treatment  Patient Details  Name: Misty Hebert MRN: [id number] Date of Birth: 10/25/1950 Referring Provider: Dr. Milinda Pointer  Encounter Date: 12/17/2015      PT End of Session - 12/17/15 1500    Visit Number 2   Number of Visits 8   Date for PT Re-Evaluation 01/08/16   Authorization - Visit Number 2   Authorization - Number of Visits 10   PT Start Time 0151   PT Stop Time [id number]   PT Time Calculation (min) 54 min   Activity Tolerance Patient tolerated treatment well;Patient limited by pain   Behavior During Therapy [facility] for tasks assessed/performed      Past Medical History  Diagnosis Date  . Hypertension   . Reflux   . High cholesterol     Past Surgical History  Procedure Laterality Date  . Colon resection    . Knee surgery    . Shoulder surgery    . Breast biopsy Right     CYST REMOVED-16-53YRS AGO-NEG  . Abdominal hysterectomy      There were no vitals filed for this visit.      Subjective Assessment - 12/17/15 1455    Subjective Pt reports that following last treatment session she went home in no pain, but noticed increased pain over the next several days. She states that it was so painful she could not put weight on it. Reports intermittent muscle cramps in both feet but primarily the L, especially with movement of toes.   Pertinent History 3 months of L forefoot/toe pain.  Pt. has had 2 injections and a series of cortisone injections.  Pt. having a lot of muscle cramps   Limitations Standing;Walking   Patient Stated Goals Decrease pain in L foot/toe with daily tasks/ walking.     Currently in Pain? Yes   Pain Score 3    Pain Location Other (Comment)  1st and 2nd toe   Pain Orientation Left   Pain Type Chronic pain   Aggravating Factors  extension   Multiple Pain Sites Yes   Pain Score 6    Pain Location Foot  dorsal surface of foot; plantar fascia   Pain Orientation Left   Pain Type Chronic pain   Aggravating Factors  tender to palpation      Objective: Manual PT: mobilization of L 1st and 2nd MTP jt, STM to L plantar fascia, traction of 1st and 2nd MTP, mobilization of scar tissue deep to healed incision. Pt experiences increased pain with palpation and mobilization of stated tissues/joints, especially tender on volar surface of foot at MTP jt. Ther ex: dorsiflexion contract and relax to address decreased ROM (5 sec hold; 3 minutes), isometric toe flexion of great toe and digits 2-5 (5 sec hold; 10 minutes), "toe yoga" to isolate intrinsic musculature and promote neurophysiologic change, foot intrinsics with ~1in ball manipulation between first 3 toes. Ice massage to address residual inflammation. Pt reports that she leaves PT appointment in no pain.   Pt response for medical necessity: Pt demonstrates hypomobility of L foot and 1st and 2nd MTP jt with increased pain. She will benefit from skilled PT to mobilize MTP jts, increase pain free range, address intrinsic foot strength and progress towards more functional gait.          PT Education - 12/17/15 1257    Education provided  Yes   Education Details See HEP handouts (scar massage for pain control/break up adhesions).  Toe extension stretches/ distration of 2nd joint.               PT Long Term Goals - 12/12/15 1308    PT LONG TERM GOAL #1   Title Pt. I with HEP to increase L toe extension to [facility] as compared to R toes to improve pain-free mobility/ gait pattern.     Baseline Pain limited L toe extension A/PROM.    Time 4   Period Weeks   Status New   PT LONG TERM GOAL #2   Title Pt. will increase LEFS to >55 out of 80 to improve pain-free mobility.     Baseline LEFS 40 out of 80 on 6/7   Time 4   Period Weeks   Status New   PT LONG TERM GOAL #3   Title Pt. will report 2/10 L 2nd toe pain at worst wtih  standing/walking tasks to improve pain-free mobility.     Baseline >4/10 pain in L 2nd toe with standing/walking tasks.    Time 4   Period Weeks   Status New             Plan - 12/17/15 1502    Clinical Impression Statement Pt with no noticeable change since last session with excaerbation of pain due to mobilization of scar tissue and 1st/2nd MTP jt. Pt experiences intermittent muscle cramps during intrinsic toe exercises; gets relief with small bout of weight bearing. Demonstrates difficulty with isolation of individual MTP jts with activity. Pt with tenderness to palation of L plantar fascia.   Rehab Potential Good   PT Frequency 2x / week   PT Duration 4 weeks   PT Treatment/Interventions Gait training;Stair training;Functional mobility training;Therapeutic activities;Cryotherapy;Iontophoresis 4mg /ml Dexamethasone;Moist Heat;Ultrasound;Patient/family education;Neuromuscular re-education;Balance training;Therapeutic exercise;Manual techniques;Dry needling;Scar mobilization;Passive range of motion   PT Next Visit Plan Increase scar/toe mobility and pain mgmt.     PT Home Exercise Plan See handouts   Consulted and Agree with Plan of Care Patient      Patient will benefit from skilled therapeutic intervention in order to improve the following deficits and impairments:  Abnormal gait, Decreased strength, Difficulty walking, Impaired flexibility, Decreased balance, Decreased range of motion, Decreased skin integrity, Decreased scar mobility, Hypomobility, Pain  Visit Diagnosis: Joint stiffness of toe, left  Difficulty in walking, not elsewhere classified  Pain in left toe(s)  Pain in left foot  Muscle weakness (generalized)     Problem List There are no active problems to display for this patient.  Pura Spice, PT, DPT # [phone] Mickel Baas Brenen Beigel SPT  12/17/2015, 3:07 PM  [place] [facility] [address] [place], [place],  [address] Phone: [phone]   Fax:  [phone]  Name: Misty Hebert MRN: [id number] Date of Birth: 1951-01-03

## 2015-12-17 NOTE — Addendum Note (Signed)
Addended by: Dorcas Carrow C on: 12/17/2015 01:19 PM   Modules accepted: Orders

## 2015-12-17 NOTE — Therapy (Addendum)
[facility] [address] [place], [place], [address] Phone: [phone]   Fax:  [phone]  Physical Therapy Evaluation  Patient Details  Name: Misty Hebert MRN: [id number] Date of Birth: 1950/07/14 Referring Provider: Dr. Milinda Pointer  Encounter Date: 12/11/2015    Past Medical History  Diagnosis Date  . Hypertension   . Reflux   . High cholesterol     Past Surgical History  Procedure Laterality Date  . Colon resection    . Knee surgery    . Shoulder surgery    . Breast biopsy Right     CYST REMOVED-16-43YRS AGO-NEG  . Abdominal hysterectomy      There were no vitals filed for this visit.    Pt. Reports >4/10 L 2nd MPJ chronic pain and significant tenderness/ sensitivity with palpation.       Manual: scar massage to L 2nd/3rd toe scar massage (very tender/ pain limited). 2nd/3rd toe LAD grade II-III 20 sec (5x). There.ex.: see HEP    Pt. is a pleasant 65 y/o female with chronic c/o L forefoot/ toe pain.  Pt. had L toe surgery in past (see medical records) and has received multiple injections wtih no benefit.  Pt. states pain worsens with increase activity/ walking.  Pt. reports 4/10 L 2nd MPJ capsulitis pain currently at rest and increase pain with activity.  Pt. present with good L knee/ankle ROM but limited L 1st/2nd MPJ stiffness (flexion/ ext.).  Pt. ambulates with L antalgic gait pattern in clinic with limited L toe extension/ wt. bearing.  Pt. will benefit from skilled PT services to increase L toe/ scar mobility to improve pain-free mobility.           PT Education - 12/17/15 1257    Education provided Yes   Education Details See HEP handouts (scar massage for pain control/break up adhesions).  Toe extension stretches/ distration of 2nd joint.               PT Long Term Goals - 12/12/15 1308    PT LONG TERM GOAL #1   Title Pt. I with HEP to increase L toe extension to [facility] as compared to R toes to  improve pain-free mobility/ gait pattern.     Baseline Pain limited L toe extension A/PROM.    Time 4   Period Weeks   Status New   PT LONG TERM GOAL #2   Title Pt. will increase LEFS to >55 out of 80 to improve pain-free mobility.     Baseline LEFS 40 out of 80 on 6/7   Time 4   Period Weeks   Status New   PT LONG TERM GOAL #3   Title Pt. will report 2/10 L 2nd toe pain at worst wtih standing/walking tasks to improve pain-free mobility.     Baseline >4/10 pain in L 2nd toe with standing/walking tasks.    Time 4   Period Weeks   Status New       Patient will benefit from skilled therapeutic intervention in order to improve the following deficits and impairments:  Abnormal gait, Decreased strength, Difficulty walking, Impaired flexibility, Decreased balance, Decreased range of motion, Decreased skin integrity, Decreased scar mobility, Hypomobility, Pain  Visit Diagnosis: Pain in left toe(s)  Joint stiffness of toe, left  Difficulty in walking, not elsewhere classified  Pain in left foot  Muscle weakness (generalized)     Problem List There are no active problems to display for this patient.  Pura Spice, PT, DPT # [phone]   12/17/2015, 1:12 PM  [facility] [address] [place], [place], [address] Phone: [phone]   Fax:  [phone]  Name: Misty Hebert MRN: [id number] Date of Birth: Dec 30, 1950

## 2015-12-19 ENCOUNTER — Encounter: Payer: Self-pay | Admitting: Physical Therapy

## 2015-12-19 ENCOUNTER — Ambulatory Visit: Payer: Medicare Other

## 2015-12-19 DIAGNOSIS — M79672 Pain in left foot: Secondary | ICD-10-CM

## 2015-12-19 DIAGNOSIS — M79675 Pain in left toe(s): Secondary | ICD-10-CM | POA: Diagnosis not present

## 2015-12-19 DIAGNOSIS — R262 Difficulty in walking, not elsewhere classified: Secondary | ICD-10-CM

## 2015-12-19 DIAGNOSIS — M25675 Stiffness of left foot, not elsewhere classified: Secondary | ICD-10-CM

## 2015-12-19 DIAGNOSIS — M6281 Muscle weakness (generalized): Secondary | ICD-10-CM

## 2015-12-19 NOTE — Therapy (Signed)
[facility] [address] [place], [place], [address] Phone: [phone]   Fax:  [phone]  Physical Therapy Treatment  Patient Details  Name: Misty Hebert MRN: [id number] Date of Birth: 11-23-1950 Referring Provider: Dr. Milinda Pointer  Encounter Date: 12/19/2015      PT End of Session - 12/19/15 1618    Visit Number 3   Number of Visits 8   Date for PT Re-Evaluation 01/08/16   Authorization - Visit Number 3   Authorization - Number of Visits 10   PT Start Time 0320   PT Stop Time [id number]   PT Time Calculation (min) 58 min   Activity Tolerance Patient tolerated treatment well;Patient limited by pain   Behavior During Therapy [facility] for tasks assessed/performed      Past Medical History  Diagnosis Date  . Hypertension   . Reflux   . High cholesterol     Past Surgical History  Procedure Laterality Date  . Colon resection    . Knee surgery    . Shoulder surgery    . Breast biopsy Right     CYST REMOVED-16-71YRS AGO-NEG  . Abdominal hysterectomy      There were no vitals filed for this visit.      Subjective Assessment - 12/19/15 1524    Subjective Pt reports that following last treamtent she went home without pain but experienced an increase in pain at 9pm. She states the sx resolved by the next day because she stayed off her feet. She arrives at the clinic wearing her tennis shoes and states that she has decreased pain due to more weight bearing at the heel.   Pertinent History 3 months of L forefoot/toe pain.  Pt. has had 2 injections and a series of cortisone injections.  Pt. having a lot of muscle cramps   Limitations Standing;Walking   Patient Stated Goals Decrease pain in L foot/toe with daily tasks/ walking.     Currently in Pain? No/denies      Objective: Manual PT: mobilization of L 1st, 2nd 3rd MTP jt, STM to L plantar fascia, traction of 1st and 2nd MTP, mobilization of scar tissue deep to healed incision,  mobilization of 1-3 rays of L foot. Pt experiences increased pain with palpation and mobilization of stated tissues/joints, especially tender on volar surface of foot at MTP jt. Ther ex: "toe yoga" to isolate intrinsic musculature and promote neurophysiologic change, foot intrinsics with ~1in ball manipulation between first 3 toes. Ice massage to address residual inflammation. Pt reports that she leaves PT appointment in no pain.   Pt response for medical necessity: Pt demonstrates hypomobility of L foot and 1st and 2nd MTP jt with increased pain. She will benefit from skilled PT to mobilize MTP jts, increase pain free range, address intrinsic foot strength and progress towards more functional gait.        PT Long Term Goals - 12/12/15 1308    PT LONG TERM GOAL #1   Title Pt. I with HEP to increase L toe extension to [facility] as compared to R toes to improve pain-free mobility/ gait pattern.     Baseline Pain limited L toe extension A/PROM.    Time 4   Period Weeks   Status New   PT LONG TERM GOAL #2   Title Pt. will increase LEFS to >55 out of 80 to improve pain-free mobility.     Baseline LEFS 40 out of 80 on 6/7   Time  4   Period Weeks   Status New   PT LONG TERM GOAL #3   Title Pt. will report 2/10 L 2nd toe pain at worst wtih standing/walking tasks to improve pain-free mobility.     Baseline >4/10 pain in L 2nd toe with standing/walking tasks.    Time 4   Period Weeks   Status New               Plan - 12/19/15 1619    Clinical Impression Statement Pt continues to demonstrate hypomobility of 1st/2nd MTP jt with tenderness to palpation along volar/dorsal surface of foot in close proximity to surgical hardware. Pt with hypomobility of 2-4 ray in L foot. Pt will continue to benefit from skilled PT services to address deficits in foot pain.    Rehab Potential Good   PT Frequency 2x / week   PT Duration 4 weeks   PT Treatment/Interventions Gait training;Stair training;Functional  mobility training;Therapeutic activities;Cryotherapy;Iontophoresis 4mg /ml Dexamethasone;Moist Heat;Ultrasound;Patient/family education;Neuromuscular re-education;Balance training;Therapeutic exercise;Manual techniques;Dry needling;Scar mobilization;Passive range of motion   PT Next Visit Plan Increase scar/toe mobility and pain mgmt.     PT Home Exercise Plan See handouts   Consulted and Agree with Plan of Care Patient      Patient will benefit from skilled therapeutic intervention in order to improve the following deficits and impairments:  Abnormal gait, Decreased strength, Difficulty walking, Impaired flexibility, Decreased balance, Decreased range of motion, Decreased skin integrity, Decreased scar mobility, Hypomobility, Pain  Visit Diagnosis: Joint stiffness of toe, left  Difficulty in walking, not elsewhere classified  Pain in left toe(s)  Pain in left foot  Muscle weakness (generalized)     Problem List There are no active problems to display for this patient.  This entire session was performed under direct supervision and direction of a licensed therapist/therapist assistant . I have personally read, edited and approve of the note as written.   Mickel Baas Arther Heisler SPT Huprich,Jason DPT 12/19/2015, 5:00 PM  [place] [facility] [address] [place], [place], [address] Phone: [phone]   Fax:  [phone]  Name: Misty Hebert MRN: [id number] Date of Birth: 04/20/1951

## 2015-12-23 ENCOUNTER — Ambulatory Visit: Payer: Medicare Other | Admitting: Podiatry

## 2015-12-23 ENCOUNTER — Ambulatory Visit: Payer: Medicare Other | Admitting: Physical Therapy

## 2015-12-23 DIAGNOSIS — R262 Difficulty in walking, not elsewhere classified: Secondary | ICD-10-CM

## 2015-12-23 DIAGNOSIS — M79675 Pain in left toe(s): Secondary | ICD-10-CM

## 2015-12-23 DIAGNOSIS — M6281 Muscle weakness (generalized): Secondary | ICD-10-CM

## 2015-12-23 DIAGNOSIS — M25675 Stiffness of left foot, not elsewhere classified: Secondary | ICD-10-CM

## 2015-12-23 DIAGNOSIS — M79672 Pain in left foot: Secondary | ICD-10-CM

## 2015-12-23 NOTE — Therapy (Signed)
[place] [facility] [address] [place], [place], [address] Phone: [phone]   Fax:  [phone]  Physical Therapy Treatment  Patient Details  Name: Misty Hebert MRN: [id number] Date of Birth: 09/26/1950 Referring Provider: Dr. Milinda Pointer  Encounter Date: 12/23/2015      PT End of Session - 12/23/15 1225    Visit Number 4   Number of Visits 8   Date for PT Re-Evaluation 01/08/16   Authorization - Visit Number 4   Authorization - Number of Visits 10   PT Start Time 1022   PT Stop Time 1120   PT Time Calculation (min) 58 min   Activity Tolerance Patient tolerated treatment well;Patient limited by pain   Behavior During Therapy [facility] for tasks assessed/performed      Past Medical History  Diagnosis Date  . Hypertension   . Reflux   . High cholesterol     Past Surgical History  Procedure Laterality Date  . Colon resection    . Knee surgery    . Shoulder surgery    . Breast biopsy Right     CYST REMOVED-16-67YRS AGO-NEG  . Abdominal hysterectomy      There were no vitals filed for this visit.      Subjective Assessment - 12/23/15 1223    Subjective Pt reports that she wore flat footwear to church on Sunday and experienced an increase in toe/foot pain. She states that when she wears her tennis shoes, she does not experience toe/foot pain. She also notes an acute increase in low back pain and states that she has not been very active due to pain.   MD appt. with Dr. Milinda Pointer has been rescheduled.     Pertinent History 3 months of L forefoot/toe pain.  Pt. has had 2 injections and a series of cortisone injections.  Pt. having a lot of muscle cramps   Limitations Standing;Walking   Patient Stated Goals Decrease pain in L foot/toe with daily tasks/ walking.     Currently in Pain? No/denies      Objective: Sci Fit: 10 minutes, Level 7 (warm up, no charge). Manual tx: mobilization of L 1st, 2nd 3rd MTP jt, STM to L plantar fascia,  traction of 1st and 2nd MTP, mobilization of scar tissue deep to healed incision. Pt experiences increased pain with palpation and mobilization of stated tissues/joints, Ther ex: supine: isometric toe flexion of great toe and digits 2-5 (5 sec hold; 10 minutes), AROM ext/flex of L MTPs. Standing: hip hike 2x15 with mirror for visual cue to accentuate hip hike, clamshells 1x10 B, sidelying hip abduction R 3x10, L 1x10. Pt with noted difficulty performing R hip abduction, stating increased fatigue, decreased range and muscle fasciculations while performing.   Pt response for medical necessity: Pt demonstrates hypomobility of L foot and 1st and 2nd MTP jt with increased pain as well as mild R trendelenburg with gait. She will benefit from skilled PT to mobilize MTP jts, increase pain free range, address intrinsic foot strength, strengthen hip musculature and progress towards more functional gait.         PT Education - 12/23/15 1208    Education provided Yes   Person(s) Educated Patient   Methods Verbal cues;Handout;Explanation;Demonstration   Comprehension Verbalized understanding;Returned demonstration             PT Long Term Goals - 12/12/15 1308    PT LONG TERM GOAL #1   Title Pt. I with HEP to increase  L toe extension to [facility] as compared to R toes to improve pain-free mobility/ gait pattern.     Baseline Pain limited L toe extension A/PROM.    Time 4   Period Weeks   Status New   PT LONG TERM GOAL #2   Title Pt. will increase LEFS to >55 out of 80 to improve pain-free mobility.     Baseline LEFS 40 out of 80 on 6/7   Time 4   Period Weeks   Status New   PT LONG TERM GOAL #3   Title Pt. will report 2/10 L 2nd toe pain at worst wtih standing/walking tasks to improve pain-free mobility.     Baseline >4/10 pain in L 2nd toe with standing/walking tasks.    Time 4   Period Weeks   Status New               Plan - 12/23/15 1225    Clinical Impression Statement Pt  demonstrates R trendeleburg during gait and SLS with noted weakness of R gluteus medius as compared to L. Pt will benefit from skilled PT to progress strengthening program for hip musculature to offset stress and reliance on L foot for stability during gait. Pt 1st/2nd/3rd MTP presents as hypermobile when compared to R at present session but pt with no increase in tolerance to strengthening exercise or flexion/extension.   Rehab Potential Good   PT Frequency 2x / week   PT Duration 4 weeks   PT Treatment/Interventions Gait training;Stair training;Functional mobility training;Therapeutic activities;Cryotherapy;Iontophoresis 4mg /ml Dexamethasone;Moist Heat;Ultrasound;Patient/family education;Neuromuscular re-education;Balance training;Therapeutic exercise;Manual techniques;Dry needling;Scar mobilization;Passive range of motion   PT Next Visit Plan Increase scar/toe mobility and pain mgmt.     PT Home Exercise Plan See handouts   Consulted and Agree with Plan of Care Patient      Patient will benefit from skilled therapeutic intervention in order to improve the following deficits and impairments:  Abnormal gait, Decreased strength, Difficulty walking, Impaired flexibility, Decreased balance, Decreased range of motion, Decreased skin integrity, Decreased scar mobility, Hypomobility, Pain  Visit Diagnosis: Joint stiffness of toe, left  Difficulty in walking, not elsewhere classified  Pain in left toe(s)  Pain in left foot  Muscle weakness (generalized)     Problem List There are no active problems to display for this patient.  Pura Spice, PT, DPT # [id number] Derrill Memo, SPT  12/23/2015, 2:38 PM  [place] [facility] [address] [place], [place], [address] Phone: [phone]   Fax:  [phone]  Name: Misty Hebert MRN: [id number] Date of Birth: 18-Jan-1951

## 2015-12-25 ENCOUNTER — Encounter: Payer: 59 | Admitting: Physical Therapy

## 2015-12-31 ENCOUNTER — Ambulatory Visit: Payer: Medicare Other | Admitting: Physical Therapy

## 2015-12-31 ENCOUNTER — Encounter: Payer: Self-pay | Admitting: Podiatry

## 2015-12-31 ENCOUNTER — Ambulatory Visit (INDEPENDENT_AMBULATORY_CARE_PROVIDER_SITE_OTHER): Payer: Medicare Other | Admitting: Podiatry

## 2015-12-31 DIAGNOSIS — M6281 Muscle weakness (generalized): Secondary | ICD-10-CM

## 2015-12-31 DIAGNOSIS — M79672 Pain in left foot: Secondary | ICD-10-CM

## 2015-12-31 DIAGNOSIS — M7752 Other enthesopathy of left foot: Secondary | ICD-10-CM

## 2015-12-31 DIAGNOSIS — M779 Enthesopathy, unspecified: Principal | ICD-10-CM

## 2015-12-31 DIAGNOSIS — M778 Other enthesopathies, not elsewhere classified: Secondary | ICD-10-CM

## 2015-12-31 DIAGNOSIS — M25675 Stiffness of left foot, not elsewhere classified: Secondary | ICD-10-CM

## 2015-12-31 DIAGNOSIS — M79675 Pain in left toe(s): Secondary | ICD-10-CM

## 2015-12-31 DIAGNOSIS — R262 Difficulty in walking, not elsewhere classified: Secondary | ICD-10-CM

## 2015-12-31 NOTE — Patient Instructions (Signed)

## 2015-12-31 NOTE — Therapy (Signed)
[place] [facility] [address] [place], [place], [address] Phone: [phone]   Fax:  [phone]  Physical Therapy Treatment  Patient Details  Name: Misty Hebert MRN: [id number] Date of Birth: April 26, 1951 Referring Provider: Dr. Milinda Pointer  Encounter Date: 12/31/2015      PT End of Session - 12/31/15 0958    Visit Number 5   Number of Visits 8   Date for PT Re-Evaluation 01/08/16   Authorization - Visit Number 5   Authorization - Number of Visits 10   PT Start Time 6962   PT Stop Time 0933   PT Time Calculation (min) 49 min   Activity Tolerance Patient tolerated treatment well;Patient limited by pain   Behavior During Therapy [facility] for tasks assessed/performed      Past Medical History  Diagnosis Date  . Hypertension   . Reflux   . High cholesterol     Past Surgical History  Procedure Laterality Date  . Colon resection    . Knee surgery    . Shoulder surgery    . Breast biopsy Right     CYST REMOVED-16-17YRS AGO-NEG  . Abdominal hysterectomy      There were no vitals filed for this visit.      Subjective Assessment - 12/31/15 0950    Subjective Pt. reports 1-2/10 L 2nd toe pain prior to tx. session and >8/10 pain with L toe flexion/ ext. stretches/ prolonged walking.  Pt. has f/u MD appt. this morning and planning on discussing options to manage pain/ remove hardware.     Pertinent History 3 months of L forefoot/toe pain.  Pt. has had 2 injections and a series of cortisone injections.  Pt. having a lot of muscle cramps   Limitations Standing;Walking   Patient Stated Goals Decrease pain in L foot/toe with daily tasks/ walking.     Currently in Pain? Yes   Pain Score 2    Pain Location Toe (Comment which one)   Pain Orientation Left   Pain Descriptors / Indicators Cramping;Sharp;Grimacing   Pain Type Chronic pain      Objective: Manual PT: mobilization of L 1st, 2nd 3rd MTP jt, STM to L plantar fascia, traction  of 1st and 2nd MTP, mobilization of scar tissue deep to healed incision, mobilization of 1-3 rays of L foot. Pain limited/ tender with palpation and mobilization of stated tissues/joints, especially tender on volar surface of foot at MTP jt. Ther ex: Standing toe/heel raises in //-bars with light UE assist 20x.  Reviewed/ discuss HEP.  Scifit L7 10 min. B UE/LE (consistent cadence). Supine L ankle DF/PF/IV/EV manual isometrics 4x each with mod. Resistance 10 sec. Holds. (pain tolerable).    Pt response for medical necessity: Pt demonstrates hypomobility of L foot and 1st and 2nd MTP jt with increased pain. She will benefit from skilled PT to mobilize MTP jts, increase pain free range, address intrinsic foot strength and progress towards more functional gait.  Limited progress towards goals secondary to pain at base of L 2nd toe.         PT Long Term Goals - 12/31/15 1009    PT LONG TERM GOAL #1   Title Pt. I with HEP to increase L toe extension to [facility] as compared to R toes to improve pain-free mobility/ gait pattern.     Baseline Pain limited L toe extension A/PROM at end-range.  Several episodes of significant muscle cramping in foot.   Time 4  Period Weeks   Status Partially Met   PT LONG TERM GOAL #2   Title Pt. will increase LEFS to >55 out of 80 to improve pain-free mobility.     Baseline LEFS 40 out of 80 on 6/27  (no change from initial evaluation).    Time 4   Period Weeks   Status Not Met   PT LONG TERM GOAL #3   Title Pt. will report 2/10 L 2nd toe pain at worst wtih standing/walking tasks to improve pain-free mobility.     Time 4   Period Weeks   Status Not Met            Plan - 12/31/15 1000    Clinical Impression Statement Pt. continues to ambulate with slight L antalgic gait pattern with glut. medius weakness noted.  Increase L 2nd toe pain with distance walked and tenderness remains at base of 2nd/3rd toes.  Several episodes of L foot cramping during manual stretching  to L toe/ ankle.  Pain limited L 2nd toe flexion and end-range extension.  Pts. progress and overall functional mobility limited by severity of pain in toe/foot.     Rehab Potential Good   PT Frequency 2x / week   PT Duration 4 weeks   PT Treatment/Interventions Gait training;Stair training;Functional mobility training;Therapeutic activities;Cryotherapy;Iontophoresis 63m/ml Dexamethasone;Moist Heat;Ultrasound;Patient/family education;Neuromuscular re-education;Balance training;Therapeutic exercise;Manual techniques;Dry needling;Scar mobilization;Passive range of motion   PT Next Visit Plan Increase scar/toe mobility and pain mgmt.  Pt. will call to discuss POC with PT after MD appt.     PT Home Exercise Plan See handouts   Consulted and Agree with Plan of Care Patient      Patient will benefit from skilled therapeutic intervention in order to improve the following deficits and impairments:  Abnormal gait, Decreased strength, Difficulty walking, Impaired flexibility, Decreased balance, Decreased range of motion, Decreased skin integrity, Decreased scar mobility, Hypomobility, Pain  Visit Diagnosis: Joint stiffness of toe, left  Difficulty in walking, not elsewhere classified  Pain in left toe(s)  Pain in left foot  Muscle weakness (generalized)     Problem List There are no active problems to display for this patient.  MPura Spice PT, DPT # [phone]  12/31/2015, 10:11 AM  [place] [facility] [address]Phone: [phone]  Fax:  [phone] Name: Misty GaneMRN: [id number] of Birth: [birth year]

## 2015-12-31 NOTE — Progress Notes (Signed)
She presents today for follow-up of her capsulitis second metatarsophalangeal joint of her left foot. She states that she was unable to complete physical therapy due to the pain that she was enduring any treatment. She states that she is ready for [place] to look inside and remove the hardware of necessary.  Objective: Vital signs are stable alert and oriented 3 still has pain and swelling at the second metatarsophalangeal joint left foot.  Assessment: Painful internal fixation with capsulitis second metatarsophalangeal joint left foot.  Plan: We discussed the etiology pathology conservative versus surgical therapies. At this point we are going to remove painful internal fixation we consented her for that today as well as a capsulotomy. We discussed the possible postop complications which may include not limited to postop pain bleeding swelling infection recurrence need for further surgery. She explained to me that she would like for me to fix anything that I find in their set her foot will start to feel better.

## 2016-01-02 ENCOUNTER — Ambulatory Visit: Payer: Medicare Other | Admitting: Physical Therapy

## 2016-01-08 ENCOUNTER — Ambulatory Visit: Payer: Medicare Other | Admitting: Podiatry

## 2016-01-09 ENCOUNTER — Other Ambulatory Visit: Payer: Self-pay | Admitting: Podiatry

## 2016-01-09 MED ORDER — CEPHALEXIN 500 MG PO CAPS: 500.0000 mg | ORAL_CAPSULE | Freq: Three times a day (TID) | ORAL | Status: DC

## 2016-01-09 MED ORDER — OXYCODONE-ACETAMINOPHEN 10-325 MG PO TABS: 1.0000 | ORAL_TABLET | Freq: Four times a day (QID) | ORAL | Status: DC | PRN

## 2016-01-10 ENCOUNTER — Other Ambulatory Visit: Payer: Self-pay

## 2016-01-10 ENCOUNTER — Encounter: Payer: Self-pay | Admitting: Podiatry

## 2016-01-10 DIAGNOSIS — Z4889 Encounter for other specified surgical aftercare: Secondary | ICD-10-CM | POA: Diagnosis not present

## 2016-01-10 HISTORY — PX: FOOT SURGERY: SHX648

## 2016-01-13 ENCOUNTER — Telehealth: Payer: Self-pay | Admitting: *Deleted

## 2016-01-13 NOTE — Progress Notes (Unsigned)
DOS 01/10/2016 Removal internal fixation 2nd metatarsal left, capsulotomy 2nd MPJ left foot.

## 2016-01-13 NOTE — Telephone Encounter (Signed)
Post op courtesy call-Pt states she's doing okay, only taking a 1/2 of the pain pill because she doesn't like how it makes her feel.  I told pt that she could take OTC Ibuprofen as the package instructs if she tolerated it, no weight bearing more than 15 mins/hour, remain in the boot and original dressing and call with concerns.  Pt states understanding.

## 2016-01-17 ENCOUNTER — Ambulatory Visit (INDEPENDENT_AMBULATORY_CARE_PROVIDER_SITE_OTHER): Payer: Medicare Other

## 2016-01-17 ENCOUNTER — Ambulatory Visit (INDEPENDENT_AMBULATORY_CARE_PROVIDER_SITE_OTHER): Payer: Medicare Other | Admitting: Podiatry

## 2016-01-17 ENCOUNTER — Encounter: Payer: Self-pay | Admitting: Podiatry

## 2016-01-17 VITALS — Temp 97.7°F

## 2016-01-17 DIAGNOSIS — Z9889 Other specified postprocedural states: Secondary | ICD-10-CM

## 2016-01-17 DIAGNOSIS — M778 Other enthesopathies, not elsewhere classified: Secondary | ICD-10-CM

## 2016-01-17 DIAGNOSIS — M779 Enthesopathy, unspecified: Principal | ICD-10-CM

## 2016-01-17 DIAGNOSIS — M7752 Other enthesopathy of left foot: Secondary | ICD-10-CM

## 2016-01-23 ENCOUNTER — Encounter: Payer: Self-pay | Admitting: Podiatry

## 2016-01-23 ENCOUNTER — Ambulatory Visit (INDEPENDENT_AMBULATORY_CARE_PROVIDER_SITE_OTHER): Payer: Medicare Other | Admitting: Podiatry

## 2016-01-23 DIAGNOSIS — T847XXD Infection and inflammatory reaction due to other internal orthopedic prosthetic devices, implants and grafts, subsequent encounter: Secondary | ICD-10-CM

## 2016-01-23 DIAGNOSIS — M779 Enthesopathy, unspecified: Principal | ICD-10-CM

## 2016-01-23 DIAGNOSIS — M778 Other enthesopathies, not elsewhere classified: Secondary | ICD-10-CM

## 2016-01-23 DIAGNOSIS — Z9889 Other specified postprocedural states: Secondary | ICD-10-CM

## 2016-01-23 DIAGNOSIS — M7752 Other enthesopathy of left foot: Secondary | ICD-10-CM

## 2016-01-25 NOTE — Progress Notes (Signed)
She presents today for a postop visit status post removal internal fixation and capsulotomy second metatarsal and metatarsophalangeal joint of the left foot. She states that actually feels very good. She denies fever chills nausea vomiting muscle aches and pains.  Objective: Pulses are palpable for is in good condition sutures are intact margins are not well coapted dressing is size of infection.  Assessment: Well-healing surgical foot.  Plan: At this point I'm going to leave the sutures are in place without follow-up with her in 1 week for suture removal.

## 2016-01-27 NOTE — Progress Notes (Signed)
Subjective: Misty Hebert is a 65 y.o. is seen today in office s/p left foot HWR, MTPJ capsulotomy. She is doing well and her pain is minimal. She has remained in the surgical shoe. Denies any systemic complaints such as fevers, chills, nausea, vomiting. No calf pain, chest pain, shortness of breath.   Objective: General: No acute distress, AAOx3  DP/PT pulses palpable 2/4, CRT < 3 sec to all digits.  Protective sensation intact. Motor function intact.  Left foot: Incision is well coapted without any evidence of dehiscence and sutures intact. There is no surrounding erythema, ascending cellulitis, fluctuance, crepitus, malodor, drainage/purulence. There is minimal edema around the surgical site. There is minimal pain along the surgical site.  No other areas of tenderness to bilateral lower extremities.  No other open lesions or pre-ulcerative lesions.  No pain with calf compression, swelling, warmth, erythema.   Assessment and Plan:  Status post right foot surgery, doing well with no complications   -Treatment options discussed including all alternatives, risks, and complications -X-rays were obtained and reviewed with the patient. Status post hardware removal. No evidence of acute fracture. -Ice/elevation -Pain medication as needed. -Remaining surgical shoe. -Monitor for any clinical signs or symptoms of infection and DVT/PE and directed to call the office immediately should any occur or go to the ER. -Follow-up as scheduled or sooner if any problems arise. In the meantime, encouraged to call the office with any questions, concerns, change in symptoms.   Celesta Gentile, DPM

## 2016-01-30 ENCOUNTER — Ambulatory Visit (INDEPENDENT_AMBULATORY_CARE_PROVIDER_SITE_OTHER): Payer: Medicare Other | Admitting: Podiatry

## 2016-01-30 DIAGNOSIS — Z9889 Other specified postprocedural states: Secondary | ICD-10-CM

## 2016-01-30 NOTE — Progress Notes (Signed)
She presents today for her 3 week follow-up regarding removal of internal fixation second metatarsal left foot. She states that the foot feels 100% better and he really doesn't even swell.  Objective: Vital signs are stable she is alert and oriented 3 there is no erythema edema saline as drainage or odor. We removed one of the stitches and had some gapping of the wound so we decided to leave the stitches in.  Assessment: Well-healing surgical foot slight delay in wound healing.  Plan: Redressed today recommended that she not get this wet follow-up with her in 1 week for suture removal.

## 2016-02-06 ENCOUNTER — Encounter: Payer: Self-pay | Admitting: Podiatry

## 2016-02-06 ENCOUNTER — Ambulatory Visit (INDEPENDENT_AMBULATORY_CARE_PROVIDER_SITE_OTHER): Payer: Medicare Other | Admitting: Podiatry

## 2016-02-06 DIAGNOSIS — Z9889 Other specified postprocedural states: Secondary | ICD-10-CM

## 2016-02-07 NOTE — Progress Notes (Signed)
Chris presents today nearly 4 weeks status post removal internal fixation and release of the second metatarsophalangeal joint of the left foot. She states that it is doing very well and she has had absolutely no pain to the plantar aspect of the second metatarsal which she was having prior to be asleep.  Objective: Vital signs are stable alert and oriented 3. Pulses are palpable. Neurologic sensorium is intact. Deep tendon reflexes are intact. Muscle strength +5 over 5 is intact. Sutures are intact margins are well coapted she has great range of motion of the second metatarsophalangeal joint with no pain on dorsal or plantar palpation nor on range of motion of the toe itself.  Assessment: Well-healing surgical toe left.  Plan: Sutures were removed today, allow her to start getting this wet she will follow up with me in the next couple weeks at which time she will be back into a regular shoe.

## 2016-02-22 ENCOUNTER — Other Ambulatory Visit: Payer: Self-pay | Admitting: Sports Medicine

## 2016-02-22 MED ORDER — CEPHALEXIN 500 MG PO CAPS: 500.0000 mg | ORAL_CAPSULE | Freq: Three times a day (TID) | ORAL | 0 refills | Status: DC

## 2016-02-22 NOTE — Progress Notes (Signed)
Patient called stating that at surgical foot started experiencing drainage at previously healed incision site when she was trying to wear normal shoe. States that she had been covering area with bandaid. Advised patient to cleanse area with anti-bacteral wash and dress with steri-strips and refrain from occulsive dressings and ill fitting shoes. Patient to return to using post op shoe until area is improved. Sent to pharmacy Keflex and advised patient to closely monitor symptoms, if fails to improve over weekend go to ER. Advised patient to keep her scheduled follow up appointment in office for next week or call to come in sooner if problems or issues arise.  -Dr. Cannon Kettle

## 2016-02-26 ENCOUNTER — Ambulatory Visit (INDEPENDENT_AMBULATORY_CARE_PROVIDER_SITE_OTHER): Payer: Medicare Other | Admitting: Podiatry

## 2016-02-26 DIAGNOSIS — Z9889 Other specified postprocedural states: Secondary | ICD-10-CM

## 2016-02-26 DIAGNOSIS — T148 Other injury of unspecified body region: Secondary | ICD-10-CM

## 2016-02-26 DIAGNOSIS — T148XXD Other injury of unspecified body region, subsequent encounter: Secondary | ICD-10-CM

## 2016-02-26 DIAGNOSIS — T847XXD Infection and inflammatory reaction due to other internal orthopedic prosthetic devices, implants and grafts, subsequent encounter: Secondary | ICD-10-CM

## 2016-02-26 MED ORDER — DOXYCYCLINE HYCLATE 100 MG PO TABS: 100.0000 mg | ORAL_TABLET | Freq: Two times a day (BID) | ORAL | 0 refills | Status: DC

## 2016-02-26 NOTE — Progress Notes (Signed)
She presents today for follow-up incision site left foot. She states that it opened up and started draining.  Objective: Vital signs are stable she is alert and oriented 3. Pulses are palpable. She has a dehiscence with erythema surrounding the entire incision site overlying the second metatarsal left.  Assessment: Wound dehiscence.  Plan: At this point I debrided the area today placed her on doxycycline suggest that she continue to keep the wound dry and clean other than soaking in Betadine warm water once daily. Follow-up with me in 1 week at which time we will consider surgical closure.

## 2016-02-27 ENCOUNTER — Encounter: Payer: Medicare Other | Admitting: Podiatry

## 2016-03-04 ENCOUNTER — Encounter: Payer: Medicare Other | Admitting: Podiatry

## 2016-03-04 ENCOUNTER — Encounter: Payer: Self-pay | Admitting: Podiatry

## 2016-03-04 ENCOUNTER — Ambulatory Visit (INDEPENDENT_AMBULATORY_CARE_PROVIDER_SITE_OTHER): Payer: Medicare Other | Admitting: Podiatry

## 2016-03-04 DIAGNOSIS — Z9889 Other specified postprocedural states: Secondary | ICD-10-CM

## 2016-03-04 DIAGNOSIS — T148XXD Other injury of unspecified body region, subsequent encounter: Secondary | ICD-10-CM

## 2016-03-04 DIAGNOSIS — T847XXD Infection and inflammatory reaction due to other internal orthopedic prosthetic devices, implants and grafts, subsequent encounter: Secondary | ICD-10-CM

## 2016-03-04 DIAGNOSIS — T148 Other injury of unspecified body region: Secondary | ICD-10-CM

## 2016-03-04 NOTE — Patient Instructions (Signed)

## 2016-03-04 NOTE — Progress Notes (Signed)
Misty Hebert presents today for follow-up of a nonhealing wound to the dorsal aspect of the left foot. She denies fever chills nausea vomiting muscle aches and pains states that she has 2 days worth of her doxycycline left. She states that she is getting considerable yellow exudate on her dressing and she's been soaking in Betadine warm water.  Objective: Vital signs are stable she is alert and oriented 3. Pulses are palpable. His nonhealing wound to the dorsal incision site overlying second metatarsal appears to have expanded slightly though the erythema and purulence appears to be less at this time. She has some reactive pigmentation secondary to soaking in the Betadine. She has mild tenderness around the dehiscence. The wound appears to be deepening rather than becoming more superficial.  Assessment: Worsening of the ulceration status post screw removal left foot.  Plan: At this point going to take her to the operating room for debridement. She will follow-up with Dr. Amalia Hailey postoperatively for evaluation and consideration for a wound VAC. I plan to do this surgery is Friday. A consent form was signed today for wound debridement.

## 2016-03-05 ENCOUNTER — Other Ambulatory Visit: Payer: Self-pay | Admitting: Podiatry

## 2016-03-05 MED ORDER — OXYCODONE-ACETAMINOPHEN 10-325 MG PO TABS: 1.0000 | ORAL_TABLET | Freq: Four times a day (QID) | ORAL | 0 refills | Status: DC | PRN

## 2016-03-05 MED ORDER — LEVOFLOXACIN 750 MG PO TABS: 750.0000 mg | ORAL_TABLET | Freq: Every day | ORAL | 1 refills | Status: DC

## 2016-03-06 ENCOUNTER — Encounter: Payer: Self-pay | Admitting: Podiatry

## 2016-03-06 DIAGNOSIS — T148 Other injury of unspecified body region: Secondary | ICD-10-CM

## 2016-03-10 NOTE — Progress Notes (Signed)
DOS 03/06/2016 Debridement wound left with deep culture

## 2016-03-12 ENCOUNTER — Ambulatory Visit (INDEPENDENT_AMBULATORY_CARE_PROVIDER_SITE_OTHER): Payer: Medicare Other | Admitting: Podiatry

## 2016-03-12 ENCOUNTER — Encounter: Payer: Self-pay | Admitting: Podiatry

## 2016-03-12 DIAGNOSIS — T148 Other injury of unspecified body region: Secondary | ICD-10-CM

## 2016-03-12 DIAGNOSIS — Z87898 Personal history of other specified conditions: Secondary | ICD-10-CM

## 2016-03-12 DIAGNOSIS — Z9889 Other specified postprocedural states: Secondary | ICD-10-CM

## 2016-03-12 DIAGNOSIS — T148XXD Other injury of unspecified body region, subsequent encounter: Secondary | ICD-10-CM

## 2016-03-12 MED ORDER — SULFAMETHOXAZOLE-TRIMETHOPRIM 800-160 MG PO TABS: 1.0000 | ORAL_TABLET | Freq: Two times a day (BID) | ORAL | 0 refills | Status: AC

## 2016-03-12 NOTE — Progress Notes (Signed)
Patient ID: Misty Hebert, female   DOB: 1951-04-07, 65 y.o.   MRN: [id number] Subjective  Patient comes in today one week postoperatively. Patient states that she's doing well with a moderate amount of pain. She was walking yesterday and experienced increased pain and tenderness to the left foot. Next  Objective  Incision site is well coapted with sutures intact there is a mild to moderate amount of edema and erythema surrounding the incision site. Pain on palpation and tenderness to the peri-incisional area.  Assessment  #1 one week postoperative incision and drainage with primary closure left foot  Plan of care  #1 the patient was evaluated  #2 dressings changed  #3 patient is to return to clinic in 1 week for follow-up evaluation postoperatively #4 culture results are negative with no growth for the last 2 days.-Final report #5 due to the periwound erythema with edema there is possible postoperative infection therefore prescription for Bactrim DS was given to the patient and since the pharmacy for 7 days take twice a day, empiric therapy.

## 2016-03-12 NOTE — Patient Instructions (Signed)
Incision Care °An incision is when a surgeon cuts into your body. After surgery, the incision needs to be cared for properly to prevent infection.  °HOW TO CARE FOR YOUR INCISION °· Take medicines only as directed by your health care provider. °· There are many different ways to close and cover an incision, including stitches, skin glue, and adhesive strips. Follow your health care provider's instructions on: °¨ Incision care. °¨ Bandage (dressing) changes and removal. °¨ Incision closure removal. °· Do not take baths, swim, or use a hot tub until your health care provider approves. You may shower as directed by your health care provider. °· Resume your normal diet and activities as directed. °· Use anti-itch medicine (such as an antihistamine) as directed by your health care provider. The incision may itch while it is healing. Do not pick or scratch at the incision. °· Drink enough fluid to keep your urine clear or pale yellow. °SEEK MEDICAL CARE IF:  °· You have drainage, redness, swelling, or pain at your incision site. °· You have muscle aches, chills, or a general ill feeling. °· You notice a bad smell coming from the incision or dressing. °· Your incision edges separate after the sutures, staples, or skin adhesive strips have been removed. °· You have persistent nausea or vomiting. °· You have a fever. °· You are dizzy. °SEEK IMMEDIATE MEDICAL CARE IF:  °· You have a rash. °· You faint. °· You have difficulty breathing. °MAKE SURE YOU:  °· Understand these instructions. °· Will watch your condition. °· Will get help right away if you are not doing well or get worse. °  °This information is not intended to replace advice given to you by your health care provider. Make sure you discuss any questions you have with your health care provider. °  °Document Released: 01/09/2005 Document Revised: 07/13/2014 Document Reviewed: 08/16/2013 °Elsevier Interactive Patient Education ©2016 [employer] ° °

## 2016-03-19 ENCOUNTER — Encounter: Payer: Self-pay | Admitting: Podiatry

## 2016-03-19 ENCOUNTER — Ambulatory Visit (INDEPENDENT_AMBULATORY_CARE_PROVIDER_SITE_OTHER): Payer: Medicare Other | Admitting: Podiatry

## 2016-03-19 DIAGNOSIS — T8131XD Disruption of external operation (surgical) wound, not elsewhere classified, subsequent encounter: Secondary | ICD-10-CM

## 2016-03-19 DIAGNOSIS — Z9889 Other specified postprocedural states: Secondary | ICD-10-CM

## 2016-03-21 NOTE — Progress Notes (Signed)
She presents today for follow-up of wound debridement and primary closure date of surgery 03/06/2016. She states that she get the dressing wet this morning but has kept it dry until that point. She states that she had terrible stomach pain she was taking both Levaquin and Bactrim. So she discontinued both. She states that the foot is really not been hurting her.  Objective: Vital signs are stable she is alert and oriented 3. Pulses are palpable. Sutures are intact R [place] coapted there is no erythema saline as drainage or odor. The wound appears to be gapping open between the stitches. It is mildly tender upon debridement today. No purulence no malodor. No bleeding.  Assessment: Slowly healing wound sutures remain intact dorsal aspect left foot.  Plan: I encouraged her to get back on her Levaquin 750 mg 1 by mouth daily until finished. She will follow-up with Dr. Amalia Hailey in 1 week. May wish to consider removing stitches and evaluate for wound VAC.  Misty Hebert DPM FACFAS

## 2016-03-30 ENCOUNTER — Ambulatory Visit (INDEPENDENT_AMBULATORY_CARE_PROVIDER_SITE_OTHER): Payer: Medicare Other | Admitting: Podiatry

## 2016-03-30 ENCOUNTER — Encounter: Payer: Self-pay | Admitting: Podiatry

## 2016-03-30 VITALS — Temp 97.8°F

## 2016-03-30 DIAGNOSIS — Z9889 Other specified postprocedural states: Secondary | ICD-10-CM

## 2016-03-30 NOTE — Progress Notes (Signed)
Subjective: Patient presents 3 weeks postop for left foot I&D. Patient states that she does have a history of the vomiting related to a reaction of Bactrim antibiotic. She immediately discontinue taking the Bactrim antibiotic. Patient states that she is done with her Levaquin antibiotic.  Objective: Minimal dehiscence noted to 2 separate locations within the incision site both measuring 0.2 cm in length by 0.1 cm in width. Dehiscence site is fibrotic with serous drainage. No malodor, and periwound integrity is intact.  Assessment: Status post 3 weeks left foot incision and drainage with primary closure.  Plan of care: Today sutures were removed. Incision site is well coapted with the exception of the 2 noted dehiscence sites. I was overapplied with dry sterile dressing and compressive bandage. Recommended continued compressive dressings to prevent edema and further dehiscence.  Patient is to return to clinic in 1 week for reevaluation. Continue daily application of Iodosorb dressing.

## 2016-04-09 ENCOUNTER — Ambulatory Visit (INDEPENDENT_AMBULATORY_CARE_PROVIDER_SITE_OTHER): Payer: Medicare Other | Admitting: Podiatry

## 2016-04-09 DIAGNOSIS — T148XXD Other injury of unspecified body region, subsequent encounter: Secondary | ICD-10-CM

## 2016-04-09 DIAGNOSIS — Z9889 Other specified postprocedural states: Secondary | ICD-10-CM

## 2016-04-09 NOTE — Progress Notes (Signed)
She presents today for follow-up of her nonhealing wound left foot. She states that she has been applying her Iodosorb gel and soaking her foot.  Objective: Vital signs are stable she's alert 3 wound appears to be healing very nicely no signs of infection.  Assessment: Well-healing surgical foot.  Plan: I encouraged her to continue the use of the Iodosorb gel and the soaks. I encouraged her do this for at least another 2 weeks she will follow-up with me at that time.  Remember to thank her for the Apple butter and the chowchow.

## 2016-04-20 ENCOUNTER — Emergency Department
Admission: EM | Admit: 2016-04-20 | Discharge: 2016-04-20 | Disposition: A | Discharge status: Home / Self Care | Payer: Medicare Other | Attending: Emergency Medicine | Admitting: Emergency Medicine

## 2016-04-20 ENCOUNTER — Emergency Department: Payer: Medicare Other

## 2016-04-20 ENCOUNTER — Encounter: Payer: Self-pay | Admitting: Emergency Medicine

## 2016-04-20 DIAGNOSIS — Z79899 Other long term (current) drug therapy: Secondary | ICD-10-CM | POA: Diagnosis not present

## 2016-04-20 DIAGNOSIS — I10 Essential (primary) hypertension: Secondary | ICD-10-CM | POA: Diagnosis not present

## 2016-04-20 DIAGNOSIS — Z791 Long term (current) use of non-steroidal anti-inflammatories (NSAID): Secondary | ICD-10-CM | POA: Diagnosis not present

## 2016-04-20 DIAGNOSIS — R079 Chest pain, unspecified: Secondary | ICD-10-CM | POA: Diagnosis not present

## 2016-04-20 LAB — CBC
HEMATOCRIT: 42.2 % (ref 35.0–47.0)
HEMOGLOBIN: 14.6 g/dL (ref 12.0–16.0)
MCH: 29.2 pg (ref 26.0–34.0)
MCHC: 34.6 g/dL (ref 32.0–36.0)
MCV: 84.3 fL (ref 80.0–100.0)
Platelets: 234 10*3/uL (ref 150–440)
RBC: 5 MIL/uL (ref 3.80–5.20)
RDW: 15.4 % — ABNORMAL HIGH (ref 11.5–14.5)
WBC: 8 10*3/uL (ref 3.6–11.0)

## 2016-04-20 LAB — BASIC METABOLIC PANEL
ANION GAP: 8 (ref 5–15)
BUN: 12 mg/dL (ref 6–20)
CALCIUM: 9 mg/dL (ref 8.9–10.3)
CO2: 26 mmol/L (ref 22–32)
Chloride: 106 mmol/L (ref 101–111)
Creatinine, Ser: 0.81 mg/dL (ref 0.44–1.00)
GLUCOSE: 127 mg/dL — AB (ref 65–99)
POTASSIUM: 4.2 mmol/L (ref 3.5–5.1)
Sodium: 140 mmol/L (ref 135–145)

## 2016-04-20 LAB — TROPONIN I

## 2016-04-20 MED ORDER — DICYCLOMINE HCL 20 MG PO TABS: 20.0000 mg | ORAL_TABLET | Freq: Three times a day (TID) | ORAL | 0 refills | Status: DC | PRN

## 2016-04-20 NOTE — ED Provider Notes (Signed)
[facility] Emergency Department Provider Note  ____________________________________________   I have reviewed the triage vital signs and the nursing notes.   HISTORY  Chief Complaint Chest Pain   History limited by: Not Limited   HPI Misty Hebert is a 65 y.o. female who presents to the emergency department from GI clinic because of GI clinics concern for patient's chest pain. Patient states that she has had painin her central chest. It has been there for the past few weeks. It is intermittent. Not related to exertion. The patient states that she has had similar pain for periods of time over the past few years. States she has had extensive cardiac workup, including catheterizations and stress tests, without any concerning findings. Denies any associated shortness of breath or recent fevers.   Past Medical History:  Diagnosis Date  . High cholesterol   . Hypertension   . Reflux     There are no active problems to display for this patient.   Past Surgical History:  Procedure Laterality Date  . ABDOMINAL HYSTERECTOMY    . BREAST BIOPSY Right    CYST REMOVED-16-43YRS AGO-NEG  . COLON RESECTION    . KNEE SURGERY    . SHOULDER SURGERY      Prior to Admission medications   Medication Sig Start Date End Date Taking? Authorizing Provider  acetaminophen (TYLENOL) 325 MG tablet Take 650 mg by mouth every 6 (six) hours as needed.    Historical Provider, MD  atorvastatin (LIPITOR) 80 MG tablet  08/15/13   Historical Provider, MD  FLUoxetine (PROZAC) 20 MG capsule  08/22/13   Historical Provider, MD  HYDROcodone-acetaminophen (NORCO) 5-325 MG tablet Take 1 tablet by mouth every 6 (six) hours as needed for moderate pain. 11/01/15   Landis Martins, DPM  lisinopril (PRINIVIL,ZESTRIL) 20 MG tablet  08/15/13   Historical Provider, MD  oxyCODONE-acetaminophen (PERCOCET) 10-325 MG tablet Take 1 tablet by mouth every 6 (six) hours as needed for pain. 03/05/16   Max T  Hyatt, DPM  pantoprazole (PROTONIX) 40 MG tablet  08/15/13   Historical Provider, MD    Allergies Thorazine [chlorpromazine]; Reglan [metoclopramide]; Celebrex [celecoxib]; and Meloxicam  Family History  Problem Relation Age of Onset  . Breast cancer Maternal Aunt 82    Social History Social History  Substance Use Topics  . Smoking status: Never Smoker  . Smokeless tobacco: Never Used  . Alcohol use No    Review of Systems  Constitutional: Negative for fever. Cardiovascular: Positive for chest pain. Respiratory: Negative for shortness of breath. Gastrointestinal: Negative for abdominal pain, vomiting and diarrhea. Genitourinary: Negative for dysuria. Musculoskeletal: Negative for back pain. Skin: Negative for rash. Neurological: Negative for headaches, focal weakness or numbness.  10-point ROS otherwise negative.  ____________________________________________   PHYSICAL EXAM:  VITAL SIGNS: ED Triage Vitals  Enc Vitals Group     BP 04/20/16 1131 (!) 161/92     Pulse Rate 04/20/16 1131 80     Resp 04/20/16 1131 20     Temp 04/20/16 1131 97.8 F (36.6 C)     Temp Source 04/20/16 1131 Oral     SpO2 04/20/16 1131 99 %     Weight 04/20/16 1132 242 lb (109.8 kg)     Height 04/20/16 1132 5\' 3"  (1.6 m)     Head Circumference --      Peak Flow --      Pain Score 04/20/16 1132 3   Constitutional: Alert and oriented. Well appearing and in  no distress. Eyes: Conjunctivae are normal. Normal extraocular movements. ENT   Head: Normocephalic and atraumatic.   Nose: No congestion/rhinnorhea.   Mouth/Throat: Mucous membranes are moist.   Neck: No stridor. Hematological/Lymphatic/Immunilogical: No cervical lymphadenopathy. Cardiovascular: Normal rate, regular rhythm.  No murmurs, rubs, or gallops. Respiratory: Normal respiratory effort without tachypnea nor retractions. Breath sounds are clear and equal bilaterally. No wheezes/rales/rhonchi. Gastrointestinal:  Soft and nontender. No distention.  Genitourinary: Deferred Musculoskeletal: Normal range of motion in all extremities. No lower extremity edema. Neurologic:  Normal speech and language. No gross focal neurologic deficits are appreciated.  Skin:  Skin is warm, dry and intact. No rash noted. Psychiatric: Mood and affect are normal. Speech and behavior are normal. Patient exhibits appropriate insight and judgment.  ____________________________________________    LABS (pertinent positives/negatives)  Labs Reviewed  BASIC METABOLIC PANEL - Abnormal; Notable for the following:       Result Value   Glucose, Bld 127 (*)    All other components within normal limits  CBC - Abnormal; Notable for the following:    RDW 15.4 (*)    All other components within normal limits  TROPONIN I     ____________________________________________   EKG  I, Nance Pear, attending physician, personally viewed and interpreted this EKG  EKG Time: 1141 Rate: 74 Rhythm: normal sinus rhythm Axis: left axis deviation Intervals: qtc 448 QRS: narrow ST changes: no st elevation Impression: abnormal ekg   ____________________________________________    RADIOLOGY  CXR IMPRESSION:  Minimal bibasilar subsegmental atelectasis.    ____________________________________________   PROCEDURES  Procedures  ____________________________________________   INITIAL IMPRESSION / ASSESSMENT AND PLAN / ED COURSE  Pertinent labs & imaging results that were available during my care of the patient were reviewed by me and considered in my medical decision making (see chart for details).  Patient with intermittent chest pain for a few weeks. Work up here without elevated troponin. Doubt ACS given lack of concerning ekg finding or elevated troponin. Think more likely related to GI issues.  ____________________________________________   FINAL CLINICAL IMPRESSION(S) / ED DIAGNOSES  Final diagnoses:  Chest  pain, unspecified type     Note: This dictation was prepared with Dragon dictation. Any transcriptional errors that result from this process are unintentional    Nance Pear, MD 04/20/16 1316

## 2016-04-20 NOTE — Discharge Instructions (Signed)
Please seek medical attention for any high fevers, chest pain, shortness of breath, change in behavior, persistent vomiting, bloody stool or any other new or concerning symptoms.  

## 2016-04-20 NOTE — ED Triage Notes (Signed)
Pt from [facility] with chest pain x 2 weeks that is intermittent in nature. States it comes on gradually, then progresses to stabbing pain. She states she has had this happen before and has had cardiac workup with no cause found. Pt denies n/v, dizziness, sob, diaphoresis. Pt alert & oriented with NAD noted.

## 2016-04-20 NOTE — ED Notes (Signed)
AAOx3.  Skin warm and dry. NAD.  No SOB/ DOE. 

## 2016-04-20 NOTE — ED Notes (Signed)
AAOx3.  Skin warm and dry.  No SOB/ DOE.  C.O feeling like "I'm choking".  Describes a several year history of symptoms.

## 2016-04-22 ENCOUNTER — Encounter: Payer: Self-pay | Admitting: Podiatry

## 2016-04-22 ENCOUNTER — Ambulatory Visit (INDEPENDENT_AMBULATORY_CARE_PROVIDER_SITE_OTHER): Payer: Medicare Other | Admitting: Podiatry

## 2016-04-22 DIAGNOSIS — Z9889 Other specified postprocedural states: Secondary | ICD-10-CM

## 2016-04-22 DIAGNOSIS — T148XXD Other injury of unspecified body region, subsequent encounter: Secondary | ICD-10-CM

## 2016-04-22 NOTE — Progress Notes (Signed)
  She presents today for follow-up of her delayed wound closure left foot. She states is doing very well.  Objective: Vital signs are stable alert oriented 3 wound appears to be closed 100%. Postinflammatory hyperpigmentation is noted.  Assessment: Well healing delayed wound.  Plan: Follow up with her in a couple of weeks.

## 2016-04-23 DIAGNOSIS — Z8719 Personal history of other diseases of the digestive system: Secondary | ICD-10-CM | POA: Insufficient documentation

## 2016-04-23 DIAGNOSIS — K219 Gastro-esophageal reflux disease without esophagitis: Secondary | ICD-10-CM | POA: Insufficient documentation

## 2016-04-24 ENCOUNTER — Other Ambulatory Visit: Payer: Self-pay | Admitting: Nurse Practitioner

## 2016-04-24 DIAGNOSIS — R1310 Dysphagia, unspecified: Secondary | ICD-10-CM

## 2016-04-28 ENCOUNTER — Encounter: Payer: Self-pay | Admitting: Internal Medicine

## 2016-04-28 ENCOUNTER — Ambulatory Visit (INDEPENDENT_AMBULATORY_CARE_PROVIDER_SITE_OTHER): Payer: Medicare Other | Admitting: Internal Medicine

## 2016-04-28 VITALS — BP 122/98 | HR 79 | Ht 63.0 in | Wt 241.8 lb

## 2016-04-28 DIAGNOSIS — R079 Chest pain, unspecified: Secondary | ICD-10-CM | POA: Diagnosis not present

## 2016-04-28 MED ORDER — ASPIRIN EC 81 MG PO TBEC: 81.0000 mg | DELAYED_RELEASE_TABLET | Freq: Every day | ORAL | 3 refills | Status: DC

## 2016-04-28 NOTE — Patient Instructions (Addendum)
Medication Instructions:  Your physician has recommended you make the following change in your medication:  START taking aspirin 81mg  once daily   Labwork: none  Testing/Procedures: Your physician has requested that you have a lexiscan myoview. For further information please visit [email or web address]. Please follow instruction sheet, as given.  [place]  Your caregiver has ordered a Stress Test with nuclear imaging. The purpose of this test is to evaluate the blood supply to your heart muscle. This procedure is referred to as a "Non-Invasive Stress Test." This is because other than having an IV started in your vein, nothing is inserted or "invades" your body. Cardiac stress tests are done to find areas of poor blood flow to the heart by determining the extent of coronary artery disease (CAD). Some patients exercise on a treadmill, which naturally increases the blood flow to your heart, while others who are  unable to walk on a treadmill due to physical limitations have a pharmacologic/chemical stress agent called Lexiscan . This medicine will mimic walking on a treadmill by temporarily increasing your coronary blood flow.   Please note: these test may take anywhere between 2-4 hours to complete  PLEASE REPORT TO [facility] AT THE FIRST DESK WILL DIRECT YOU WHERE TO GO  Date of Procedure:_Tuesday, Oct 31_  Arrival Time for Procedure:__8:15am______  Instructions regarding medication:   You may take your morning medications with a sip of water.  PLEASE NOTIFY THE OFFICE AT LEAST 8 HOURS IN ADVANCE IF YOU ARE UNABLE TO KEEP YOUR APPOINTMENT.  [phone] AND  PLEASE NOTIFY NUCLEAR MEDICINE AT [facility] AT LEAST 24 HOURS IN ADVANCE IF YOU ARE UNABLE TO KEEP YOUR APPOINTMENT. [phone]  How to prepare for your Myoview test:  1. Do not eat or drink after midnight 2. No caffeine for 24 hours prior to test 3. No smoking 24 hours prior to test. 4. Your  medication may be taken with water.  If your doctor stopped a medication because of this test, do not take that medication. 5. Ladies, please do not wear dresses.  Skirts or pants are appropriate. Please wear a short sleeve shirt. 6. No perfume, cologne or lotion. 7. Wear comfortable walking shoes. No heels!            Follow-Up: Your physician recommends that you schedule a follow-up appointment in: 3 months with Dr. Saunders Revel.    Any Other Special Instructions Will Be Listed Below (If Applicable).     If you need a refill on your cardiac medications before your next appointment, please call your pharmacy.  Cardiac Nuclear Scanning A cardiac nuclear scan is used to check your heart for problems, such as the following:  A portion of the heart is not getting enough blood.  Part of the heart muscle has died, which happens with a heart attack.  The heart wall is not working normally.  In this test, a radioactive dye (tracer) is injected into your bloodstream. After the tracer has traveled to your heart, a scanning device is used to measure how much of the tracer is absorbed by or distributed to various areas of your heart. LET [facility] CARE PROVIDER KNOW ABOUT:  Any allergies you have.  All medicines you are taking, including vitamins, herbs, eye drops, creams, and over-the-counter medicines.  Previous problems you or members of your family have had with the use of anesthetics.  Any blood disorders you have.  Previous surgeries you have had.  Medical conditions you  have.  RISKS AND COMPLICATIONS Generally, this is a safe procedure. However, as with any procedure, problems can occur. Possible problems include:   Serious chest pain.  Rapid heartbeat.  Sensation of warmth in your chest. This usually passes quickly. BEFORE THE PROCEDURE Ask your health care provider about changing or stopping your regular medicines. PROCEDURE This procedure is usually done at a  hospital and takes 2-4 hours.  An IV tube is inserted into one of your veins.  Your health care provider will inject a small amount of radioactive tracer through the tube.  You will then wait for 20-40 minutes while the tracer travels through your bloodstream.  You will lie down on an exam table so images of your heart can be taken. Images will be taken for about 15-20 minutes.  You will exercise on a treadmill or stationary bike. While you exercise, your heart activity will be monitored with an electrocardiogram (ECG), and your blood pressure will be checked.  If you are unable to exercise, you may be given a medicine to make your heart beat faster.  When blood flow to your heart has peaked, tracer will again be injected through the IV tube.  After 20-40 minutes, you will get back on the exam table and have more images taken of your heart.  When the procedure is over, your IV tube will be removed. AFTER THE PROCEDURE  You will likely be able to leave shortly after the test. Unless your health care provider tells you otherwise, you may return to your normal schedule, including diet, activities, and medicines.  Make sure you find out how and when you will get your test results.   This information is not intended to replace advice given to you by your health care provider. Make sure you discuss any questions you have with your health care provider.   Document Released: 07/17/2004 Document Revised: 06/27/2013 Document Reviewed: 05/31/2013 Elsevier Interactive Patient Education 2016 [employer]. Cardiac Nuclear Scanning A cardiac nuclear scan is used to check your heart for problems, such as the following:  A portion of the heart is not getting enough blood.  Part of the heart muscle has died, which happens with a heart attack.  The heart wall is not working normally.  In this test, a radioactive dye (tracer) is injected into your bloodstream. After the tracer has traveled to your  heart, a scanning device is used to measure how much of the tracer is absorbed by or distributed to various areas of your heart. LET [facility] CARE PROVIDER KNOW ABOUT:  Any allergies you have.  All medicines you are taking, including vitamins, herbs, eye drops, creams, and over-the-counter medicines.  Previous problems you or members of your family have had with the use of anesthetics.  Any blood disorders you have.  Previous surgeries you have had.  Medical conditions you have.  RISKS AND COMPLICATIONS Generally, this is a safe procedure. However, as with any procedure, problems can occur. Possible problems include:   Serious chest pain.  Rapid heartbeat.  Sensation of warmth in your chest. This usually passes quickly. BEFORE THE PROCEDURE Ask your health care provider about changing or stopping your regular medicines. PROCEDURE This procedure is usually done at a hospital and takes 2-4 hours.  An IV tube is inserted into one of your veins.  Your health care provider will inject a small amount of radioactive tracer through the tube.  You will then wait for 20-40 minutes while the tracer travels through  your bloodstream.  You will lie down on an exam table so images of your heart can be taken. Images will be taken for about 15-20 minutes.  You will exercise on a treadmill or stationary bike. While you exercise, your heart activity will be monitored with an electrocardiogram (ECG), and your blood pressure will be checked.  If you are unable to exercise, you may be given a medicine to make your heart beat faster.  When blood flow to your heart has peaked, tracer will again be injected through the IV tube.  After 20-40 minutes, you will get back on the exam table and have more images taken of your heart.  When the procedure is over, your IV tube will be removed. AFTER THE PROCEDURE  You will likely be able to leave shortly after the test. Unless your health care  provider tells you otherwise, you may return to your normal schedule, including diet, activities, and medicines.  Make sure you find out how and when you will get your test results.   This information is not intended to replace advice given to you by your health care provider. Make sure you discuss any questions you have with your health care provider.   Document Released: 07/17/2004 Document Revised: 06/27/2013 Document Reviewed: 05/31/2013 Elsevier Interactive Patient Education [employer].

## 2016-04-28 NOTE — Progress Notes (Signed)
New Outpatient Visit Date: 04/28/2016  Referring Provider: Gayland Curry, MD [address] [place], [place] [address]  Chief Complaint: Chest pain  HPI:  Ms. Limbacher is a 65 y.o. year-old female with history of hypertension, hyperlipidemia, DVT, and GERD, who has been referred by Dr. Astrid Divine and Denice Paradise, NP for chest pain.  The patient was evaluated by GI on 04/20/16, at which time she complained of sharp chest pain radiating to the back.  Of note, prior EGD in 2015 showed non-bleeding esophageal ulcer.  Workup in the ED, including EKG and TnI x 1, was notable for poor R-wave progress across the precordial leads. Further testing was not performed at the time.  Patient was previously seen by Dr. Nehemiah Massed in 2015; stress echo at the time was normal.  The patient has continued to have intermittent chest pain. She notes that this has been present for almost 20 years, typically coming in waves that lasts several weeks to months before resolving for a year at a time. Her most recent episode began about 3 weeks ago. She reports a severe stabbing pain in the center of her chest radiating to the back with an intensity up to 10/10. It lasts anywhere from 5-20 minutes before resolving spontaneously. It happens randomly without clear precipitants, including exertion. She denies associated symptoms such as dyspnea, nausea, and vomiting. She feels like her stamina has decreased over the last few months, though, due to decreased activity. She underwent left foot surgery over the summer and has required multiple subsequent interventions due to pain and infection. She continues to ambulate with a cane. She has noted a choking sensation from time to time during which she feels as though she is "getting strangled" and coughs profusely. This lasts several minutes before resolving on its own. It often occurs at rest.  The patient reports multiple prior cardiovascular evaluations including 4 catheterizations.  Review of prior records indicate in 1994 and 2000 without any significant disease. As noted above, exercise stress echo in 2015 was also normal. 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 The most recent episode was 4 years ago and was treated with 6 months of rivaroxaban. She reports undergoing an extensive hypercoagulable workup that was negative. She is currently not on any anticoagulation or antiplatelet therapy.  --------------------------------------------------------------------------------------------------  Cardiovascular History & Procedures: Cardiovascular Problems:  Atypical chest pain  Risk Factors:  Hypertension, hyperlipidemia, family history, and obesity  Cath/PCI:  None  CV Surgery:  None  EP Procedures and Devices:  None  Non-Invasive Evaluation(s):  Exercise stress echo (01/01/14, [facility]): Normal  Recent CV Pertinent Labs: Lab Results  Component Value Date   INR 1.1 07/14/2012   K 4.2 04/20/2016   K 4.0 10/26/2014   BUN 12 04/20/2016   BUN 13 10/26/2014   CREATININE 0.81 04/20/2016   CREATININE 1.02 (H) 10/26/2014    --------------------------------------------------------------------------------------------------  Past Medical History:  Diagnosis Date  . Arthritis of knee   . Benign essential HTN   . Depression   . Diverticulitis of intestine w/o perforation or abscess w/o bleeding   . DVT (deep venous thrombosis) ([place])   . Dysphagia   . Fibromyalgia   . High cholesterol   . History of colon resection   . Hyperlipidemia   . Hypertension   . Medial meniscus tear   . Migraine   . Neuromuscular disorder ([place])   . Obesity   . Overactive bladder   . Reflux   .  Subchondral insufficiency fracture of condyle of femur ([place])   . Tension headache     Past Surgical History:  Procedure Laterality Date  . ABDOMINAL HYSTERECTOMY    . BREAST BIOPSY Right    CYST REMOVED-16-46YRS  [facility]-NEG  . CARDIAC CATHETERIZATION  1995   [facility]  . CARDIAC CATHETERIZATION  2001   [facility]  . CARDIAC CATHETERIZATION  2006   [facility]  . CARDIAC CATHETERIZATION     [facility] with Dr. Nehemiah Massed   . COLON RESECTION    . FOOT SURGERY Left 01/10/2016   03/06/16 second foot surgery  . KNEE SURGERY    . SHOULDER SURGERY      Outpatient Encounter Prescriptions as of 04/28/2016  Medication Sig  . acetaminophen (TYLENOL) 325 MG tablet Take 650 mg by mouth every 6 (six) hours as needed.  Marland Kitchen atorvastatin (LIPITOR) 80 MG tablet Take 80 mg by mouth daily at 6 PM.   . dicyclomine (BENTYL) 20 MG tablet Take 1 tablet (20 mg total) by mouth 3 (three) times daily as needed for spasms.  Marland Kitchen FLUoxetine (PROZAC) 20 MG capsule Take 20 mg by mouth daily.   Marland Kitchen lisinopril (PRINIVIL,ZESTRIL) 20 MG tablet Take 20 mg by mouth daily.   . pantoprazole (PROTONIX) 40 MG tablet Take 40 mg by mouth 2 (two) times daily.   . [DISCONTINUED] HYDROcodone-acetaminophen (NORCO) 5-325 MG tablet Take 1 tablet by mouth every 6 (six) hours as needed for moderate pain. (Patient not taking: Reported on 04/28/2016)  . [DISCONTINUED] oxyCODONE-acetaminophen (PERCOCET) 10-325 MG tablet Take 1 tablet by mouth every 6 (six) hours as needed for pain. (Patient not taking: Reported on 04/28/2016)   No facility-administered encounter medications on file as of 04/28/2016.     Allergies: Sulfamethizole; Thorazine [chlorpromazine]; Celebrex [celecoxib]; Meloxicam; and Reglan [metoclopramide]  Social History   Social History  . Marital status: Married    Spouse name: N/A  . Number of children: N/A  . Years of education: N/A   Occupational History  . Not on file.   Social History Main Topics  . Smoking status: Never Smoker  . Smokeless tobacco: Never Used  . Alcohol use No  . Drug use: No  . Sexual activity: Not on file   Other Topics Concern  . Not on file   Social History Narrative  . No narrative on file     Family History  Problem Relation Age of Onset  . Breast cancer Maternal Aunt 40    Review of Systems: A 12-system review of systems was performed and was negative except as noted in the HPI.  --------------------------------------------------------------------------------------------------  Physical Exam: BP (!) 122/98 (BP Location: Right Arm, Patient Position: Sitting, Cuff Size: Large)   Pulse 79   Ht 5\' 3"  (1.6 m)   Wt 241 lb 12 oz (109.7 kg)   LMP  (LMP Unknown)   BMI 42.82 kg/m   General:  Obese woman seated comfortably in the exam room. HEENT: No conjunctival pallor or scleral icterus.  Moist mucous membranes.  OP clear. Neck: Supple without lymphadenopathy, thyromegaly, JVD, or HJR.  No carotid bruit. Lungs: Normal work of breathing.  Clear to auscultation bilaterally without wheezes or crackles. Heart: Regular rate and rhythm without murmurs, rubs, or gallops.  Non-displaced PMI. Abd: Bowel sounds present.  Soft, NT/ND. Evaluation for hepatosplenomegaly is limited by body habitus. Ext: No lower extremity edema.  Radial, PT, and DP pulses are 2+ bilaterally. Left forefoot is covered with clean bandage. Skin: warm and  dry without rash Neuro: CNIII-XII intact.  Strength and fine-touch sensation intact in upper and lower extremities bilaterally. Psych: Normal mood and affect.  EKG:  Normal sinus rhythm with leftward axis.  Otherwise, no significant abnormalities.  Lab Results  Component Value Date   WBC 8.0 04/20/2016   HGB 14.6 04/20/2016   HCT 42.2 04/20/2016   MCV 84.3 04/20/2016   PLT 234 04/20/2016    Lab Results  Component Value Date   NA 140 04/20/2016   K 4.2 04/20/2016   CL 106 04/20/2016   CO2 26 04/20/2016   BUN 12 04/20/2016   CREATININE 0.81 04/20/2016   GLUCOSE 127 (H) 04/20/2016   ALT 25 10/26/2014   --------------------------------------------------------------------------------------------------  ASSESSMENT AND PLAN: 1.  Chest  pain The patient's chest pain is atypical and has been present intermittently for 20 years. She has undergone multiple prior cardiovascular evaluations including several catheterizations and stress tests. Most recent evaluation was in 2015 with stress echocardiogram, which was normal (albeit with low exercise capacity). I suspect the underlying etiology of her pain is due to GI pathology, given that she has had gastroesophageal reflux and esophageal ulcers in the past. The episodic stabbing pain could represent esophageal spasm as well. However, the patient has multiple cardiovascular risk factors, including a strong family history, hypertension, hyperlipidemia, and obesity. Given that she will likely need to undergo EGD and potentially other invasive GI procedures in the near future, we have agreed to perform a pharmacologic myocardial perfusion stress test to exclude high risk ischemia. Given her risk factors as well as history of multiple DVTs. I have recommended that she begin a low-dose aspirin. If her stress test is low risk, the patient should proceed with further GI evaluation as soon as possible.  Follow-up: Return to clinic in 3 months.  Nelva Bush, MD 04/28/2016 10:55 AM

## 2016-04-29 ENCOUNTER — Other Ambulatory Visit: Payer: Self-pay | Admitting: Nurse Practitioner

## 2016-04-29 DIAGNOSIS — R1310 Dysphagia, unspecified: Secondary | ICD-10-CM

## 2016-05-04 ENCOUNTER — Telehealth: Payer: Self-pay | Admitting: Internal Medicine

## 2016-05-04 NOTE — Telephone Encounter (Signed)
Pt called back to review myoview instructions.  She verbalized understanding with no further questions.

## 2016-05-04 NOTE — Telephone Encounter (Signed)
Left detailed message on pt home VM with myoview instructions. Provided call back number if details.

## 2016-05-05 ENCOUNTER — Encounter
Admission: RE | Admit: 2016-05-05 | Discharge: 2016-05-05 | Disposition: A | Discharge status: Home / Self Care | Payer: Medicare Other | Source: Ambulatory Visit | Attending: Internal Medicine | Admitting: Internal Medicine

## 2016-05-05 DIAGNOSIS — R079 Chest pain, unspecified: Secondary | ICD-10-CM | POA: Diagnosis present

## 2016-05-05 LAB — NM MYOCAR MULTI W/SPECT W/WALL MOTION / EF
CHL CUP NUCLEAR SSS: 4
CSEPHR: 61 %
CSEPPHR: 96 {beats}/min
LV dias vol: 45 mL (ref 46–106)
LV sys vol: 13 mL
Rest HR: 68 {beats}/min
SDS: 0
SRS: 4
TID: 1.13

## 2016-05-05 MED ORDER — TECHNETIUM TC 99M TETROFOSMIN IV KIT
31.5350 | PACK | Freq: Once | INTRAVENOUS | Status: AC | PRN
  Administered 2016-05-05: 31.535 via INTRAVENOUS

## 2016-05-05 MED ORDER — REGADENOSON 0.4 MG/5ML IV SOLN
0.4000 mg | Freq: Once | INTRAVENOUS | Status: AC
  Administered 2016-05-05: 0.4 mg via INTRAVENOUS

## 2016-05-05 MED ORDER — TECHNETIUM TC 99M TETROFOSMIN IV KIT
13.4180 | PACK | Freq: Once | INTRAVENOUS | Status: AC | PRN
  Administered 2016-05-05: 13.418 via INTRAVENOUS

## 2016-05-06 ENCOUNTER — Ambulatory Visit (INDEPENDENT_AMBULATORY_CARE_PROVIDER_SITE_OTHER): Payer: Medicare Other | Admitting: Podiatry

## 2016-05-06 DIAGNOSIS — M779 Enthesopathy, unspecified: Principal | ICD-10-CM

## 2016-05-06 DIAGNOSIS — M778 Other enthesopathies, not elsewhere classified: Secondary | ICD-10-CM

## 2016-05-06 DIAGNOSIS — M7752 Other enthesopathy of left foot: Secondary | ICD-10-CM

## 2016-05-06 NOTE — Progress Notes (Signed)
She presents today for follow-up of a wound to the dorsal aspect the left foot is gone on to heal. She is also concerned about pain which occurs the second metatarsophalangeal joint is present by the end of the day.  Objective: Vital signs are stable alert and oriented 3 wound is healed completely to the dorsum of foot she does have tenderness on direct palpation plantar joint.  Assessment capsulitis second metatarsophalangeal joint left foot.  Plan: I injected the area directly into the area of maximal tenderness today with dexamethasone and local anesthetic. I will follow-up with her in 1 month. Remember to take her for the sausage.

## 2016-05-11 ENCOUNTER — Ambulatory Visit
Admission: RE | Admit: 2016-05-11 | Discharge: 2016-05-11 | Disposition: A | Discharge status: Home / Self Care | Payer: Medicare Other | Source: Ambulatory Visit | Attending: Nurse Practitioner | Admitting: Nurse Practitioner

## 2016-05-13 ENCOUNTER — Ambulatory Visit: Payer: 59

## 2016-06-17 ENCOUNTER — Ambulatory Visit (INDEPENDENT_AMBULATORY_CARE_PROVIDER_SITE_OTHER): Payer: Medicare Other | Admitting: Podiatry

## 2016-06-17 DIAGNOSIS — Z9889 Other specified postprocedural states: Secondary | ICD-10-CM

## 2016-06-17 NOTE — Progress Notes (Signed)
She presents today for follow-up of her capsulitis left foot. She states it is doing much improved.  Objective: No reproducible pain today pulses remain palpable.  Assessment: Well-healing surgical foot left.  Plan: Follow up with her on an as-needed basis.

## 2016-07-02 ENCOUNTER — Other Ambulatory Visit: Payer: Self-pay | Admitting: Family Medicine

## 2016-07-02 DIAGNOSIS — Z1231 Encounter for screening mammogram for malignant neoplasm of breast: Secondary | ICD-10-CM

## 2016-07-29 ENCOUNTER — Ambulatory Visit (INDEPENDENT_AMBULATORY_CARE_PROVIDER_SITE_OTHER): Payer: Medicare Other | Admitting: Internal Medicine

## 2016-07-29 ENCOUNTER — Ambulatory Visit
Admission: RE | Admit: 2016-07-29 | Discharge: 2016-07-29 | Disposition: A | Discharge status: Home / Self Care | Payer: Medicare Other | Source: Ambulatory Visit | Attending: Family Medicine | Admitting: Family Medicine

## 2016-07-29 ENCOUNTER — Encounter: Payer: Self-pay | Admitting: Internal Medicine

## 2016-07-29 VITALS — BP 130/82 | HR 86 | Ht 63.0 in | Wt 244.2 lb

## 2016-07-29 DIAGNOSIS — M25561 Pain in right knee: Secondary | ICD-10-CM | POA: Insufficient documentation

## 2016-07-29 DIAGNOSIS — I10 Essential (primary) hypertension: Secondary | ICD-10-CM

## 2016-07-29 DIAGNOSIS — R0789 Other chest pain: Secondary | ICD-10-CM

## 2016-07-29 DIAGNOSIS — Z1231 Encounter for screening mammogram for malignant neoplasm of breast: Secondary | ICD-10-CM | POA: Diagnosis not present

## 2016-07-29 NOTE — Patient Instructions (Signed)
Medication Instructions:  Your physician recommends that you continue on your current medications as directed. Please refer to the Current Medication list given to you today.   Labwork: none  Testing/Procedures: none  Follow-Up: Your physician wants you to follow-up in: one year with Dr. Saunders Revel.  You will receive a reminder letter in the mail two months in advance. If you don't receive a letter, please call our office to schedule the follow-up appointment.   Any Other Special Instructions Will Be Listed Below (If Applicable).     If you need a refill on your cardiac medications before your next appointment, please call your pharmacy.

## 2016-07-29 NOTE — Progress Notes (Signed)
Follow-up Outpatient Visit Date: 07/29/2016  Primary Care Provider: Gayland Curry, [address] [place] [address]  Chief Complaint: Follow-up chest pain  HPI:  Ms. Misty Hebert is a 66 y.o. year-old female with history of chronic intermittent chest pain, hypertension, hyperlipidemia, DVT, GERD, and hiatal hernia who presents for follow-up of intermittent chest pain. I last saw the patient on 04/28/16, at which time she was having an episode of her sharp chest pain. This has happened off and on (approximately twice a year) for 20 years.  She had undergone extensive cardiovascular evaluation without identification of significant abnormality. We elected to repeat a myocardial perfusion stress test, which showed no significant abnormalities. She was empirically treated for esophagitis and has experienced complete resolution of her pain. She attributes it to a hiatal hernia and chronic bowel problems. She otherwise feels well without shortness of breath, palpitations, lightheadedness, and edema.  -------------------------------------------------------------------------------------------------- Cardiovascular History & Procedures: Cardiovascular Problems:  Atypical chest pain  Risk Factors:  Hypertension, hyperlipidemia, family history, and obesity  Cath/PCI:  None  CV Surgery:  None  EP Procedures and Devices:  None  Non-Invasive Evaluation(s):  Myocardial perfusion stress test (05/05/16): Low risk study with no significant ischemia. Moderate in size, mild in severity, apical and anterior defect at rest is consistent with shifting breast attenuation. LVEF normal (55-65%).  Exercise stress echo (01/01/14, [facility]): Normal  Recent CV Pertinent Labs: Lab Results  Component Value Date   INR 1.1 07/14/2012   K 4.2 04/20/2016   K 4.0 10/26/2014   BUN 12 04/20/2016   BUN 13 10/26/2014   CREATININE 0.81 04/20/2016   CREATININE 1.02 (H) 10/26/2014    Past  medical and surgical history were reviewed and updated in EPIC.  Outpatient Encounter Prescriptions as of 07/29/2016  Medication Sig  . acetaminophen (TYLENOL) 325 MG tablet Take 650 mg by mouth every 6 (six) hours as needed.  Marland Kitchen aspirin EC 81 MG tablet Take 1 tablet (81 mg total) by mouth daily.  Marland Kitchen atorvastatin (LIPITOR) 80 MG tablet Take 80 mg by mouth daily at 6 PM.   . dicyclomine (BENTYL) 20 MG tablet Take 1 tablet (20 mg total) by mouth 3 (three) times daily as needed for spasms.  Marland Kitchen FLUoxetine (PROZAC) 20 MG capsule Take 20 mg by mouth daily.   Marland Kitchen lisinopril (PRINIVIL,ZESTRIL) 20 MG tablet Take 20 mg by mouth daily.   . pantoprazole (PROTONIX) 40 MG tablet Take 40 mg by mouth 2 (two) times daily.    No facility-administered encounter medications on file as of 07/29/2016.     Allergies: Sulfamethizole; Thorazine [chlorpromazine]; Celebrex [celecoxib]; Meloxicam; and Reglan [metoclopramide]  Social History   Social History  . Marital status: Married    Spouse name: N/A  . Number of children: N/A  . Years of education: N/A   Occupational History  . Not on file.   Social History Main Topics  . Smoking status: Never Smoker  . Smokeless tobacco: Never Used  . Alcohol use No  . Drug use: No  . Sexual activity: Not on file   Other Topics Concern  . Not on file   Social History Narrative  . No narrative on file    Family History  Problem Relation Age of Onset  . Breast cancer Maternal Aunt 35  . Heart attack Mother   . Heart disease Mother     pacemaker/ICD  . Heart attack Father   . Stroke Father   . Lung cancer Father   .  Heart disease Brother     stents in early 50's    Review of Systems: A 12-system review of systems was performed and was negative except as noted in the HPI.  --------------------------------------------------------------------------------------------------  Physical Exam: BP 130/82 (BP Location: Left Arm, Patient Position: Sitting, Cuff Size:  Large)   Pulse 86   Ht 5\' 3"  (1.6 m)   Wt 244 lb 4 oz (110.8 kg)   LMP  (LMP Unknown)   BMI 43.27 kg/m   General:  Obese woman, seated comfortably in the exam room. HEENT: No conjunctival pallor or scleral icterus.  Moist mucous membranes.  OP clear. Neck: Supple without lymphadenopathy, thyromegaly, JVD, or HJR Lungs: Normal work of breathing.  Clear to auscultation bilaterally without wheezes or crackles. Heart: Regular rate and rhythm without murmurs, rubs, or gallops.  Unable to assess PMI due to body habitus. Abd: Bowel sounds present.  Soft, NT/ND without hepatosplenomegaly Ext: No lower extremity edema.  Radial, PT, and DP pulses are 2+ bilaterally. Skin: warm and dry without rash   Lab Results  Component Value Date   WBC 8.0 04/20/2016   HGB 14.6 04/20/2016   HCT 42.2 04/20/2016   MCV 84.3 04/20/2016   PLT 234 04/20/2016    Lab Results  Component Value Date   NA 140 04/20/2016   K 4.2 04/20/2016   CL 106 04/20/2016   CO2 26 04/20/2016   BUN 12 04/20/2016   CREATININE 0.81 04/20/2016   GLUCOSE 127 (H) 04/20/2016   ALT 25 10/26/2014   --------------------------------------------------------------------------------------------------  ASSESSMENT AND PLAN: Atypical chest pain Chest pain has resolved. The pain has been episodic, occurring approximately 2 times per year, for about 20 years. Given this stable pattern and multiple negative cardiovascular evaluations, I do not feel that the patient obstructive CAD. She should continue with primary prevention and evaluation/treatment of likely GI cause of her intermittent chest pain. It would be reasonable to discontinue aspirin, which could be exacerbating a GI problem, given that the patient does not have evidence significant CAD.  Essential hypertension Blood pressure upper normal today. We will not make any medication changes at this time.  Follow-up: Return to clinic in one year.  Nelva Bush,  MD 07/29/2016 11:51 AM

## 2016-07-30 ENCOUNTER — Encounter: Payer: Self-pay | Admitting: Internal Medicine

## 2016-07-30 DIAGNOSIS — R0789 Other chest pain: Secondary | ICD-10-CM | POA: Insufficient documentation

## 2016-07-30 DIAGNOSIS — I10 Essential (primary) hypertension: Secondary | ICD-10-CM | POA: Insufficient documentation

## 2016-07-30 DIAGNOSIS — Z6841 Body Mass Index (BMI) 40.0 and over, adult: Secondary | ICD-10-CM | POA: Insufficient documentation

## 2016-08-31 ENCOUNTER — Ambulatory Visit: Payer: Medicare Other | Attending: Physical Medicine & Rehabilitation | Admitting: Physical Therapy

## 2016-08-31 DIAGNOSIS — M25552 Pain in left hip: Secondary | ICD-10-CM

## 2016-08-31 DIAGNOSIS — M545 Low back pain: Secondary | ICD-10-CM | POA: Diagnosis not present

## 2016-08-31 DIAGNOSIS — M25551 Pain in right hip: Secondary | ICD-10-CM | POA: Diagnosis present

## 2016-08-31 DIAGNOSIS — R2689 Other abnormalities of gait and mobility: Secondary | ICD-10-CM

## 2016-08-31 DIAGNOSIS — G8929 Other chronic pain: Secondary | ICD-10-CM

## 2016-08-31 NOTE — Therapy (Signed)
[facility] [address] [place], [place], [address] Phone: [phone]   Fax:  [phone]  Physical Therapy Evaluation  Patient Details  Name: Misty Hebert MRN: [id number] Date of Birth: [birth year] Referring Provider: Dr. Dorcas Mcmurray MD  Encounter Date: 08/31/2016      PT End of Session - 08/31/16 1503    Visit Number 1   Number of Visits 8   Date for PT Re-Evaluation 09/28/16   Authorization - Visit Number 1   Authorization - Number of Visits 10   PT Start Time [id number]   PT Stop Time 1401   PT Time Calculation (min) 56 min   Activity Tolerance Patient tolerated treatment well;No increased pain   Behavior During Therapy WFL for tasks assessed/performed      Past Medical History:  Diagnosis Date  . Arthritis of knee   . Benign essential HTN   . Depression   . Diverticulitis of intestine w/o perforation or abscess w/o bleeding   . DVT (deep venous thrombosis) ([place])   . Dysphagia   . Fibromyalgia   . High cholesterol   . History of colon resection   . Hyperlipidemia   . Hypertension   . Medial meniscus tear   . Migraine   . Neuromuscular disorder ([place])   . Obesity   . Overactive bladder   . Reflux   . Subchondral insufficiency fracture of condyle of femur ([place])   . Tension headache     Past Surgical History:  Procedure Laterality Date  . ABDOMINAL HYSTERECTOMY    . BREAST BIOPSY Right    CYST REMOVED-16-62YRS AGO-NEG  . CARDIAC CATHETERIZATION  1995   [facility]  . CARDIAC CATHETERIZATION  2001   [facility]  . CARDIAC CATHETERIZATION  2006   [facility]  . CARDIAC CATHETERIZATION     [facility] with Dr. Nehemiah Massed   . COLON RESECTION    . FOOT SURGERY Left 01/10/2016   03/06/16 second foot surgery  . KNEE SURGERY    . SHOULDER SURGERY      There were no vitals filed for this visit.       Subjective Assessment - 08/31/16 1313    Subjective Patient is a pleasant 66 year old female who presents to her  physical therapy evaluation for back pain.  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 She does not sleep in her bed anymore due to pain from lying on back. Pt. sleeps on her recliner. Pain levels and ability to walk varies in waves per pt. report and the first few steps are the hardest.  Sometimes the pain can lasts up to two days and then calms down.    Pertinent History hx of foot surgeries, knee problems,    Limitations Standing;Walking;House hold activities;Lifting   Patient Stated Goals decrease pain, walk without pain   Currently in Pain? Yes   Pain Score 2    Pain Location Hip   Pain Orientation Right;Left   Pain Descriptors / Indicators Aching;Cramping   Pain Type Chronic pain   Aggravating Factors  standing long durations          Right Left  Hip flexion 4/5 4-/5  Hip Abduction 4/5 4/5  Hip Adduction 4/5 4/5  Knee Extension  4+/5 4+/5  Knee Flexion 5/5 5/5  DF 5/5 5/5  PF 5/5 5/5      Trunk Flexion WFL, pain upon returning to neutral  Trunk Extension Limited, painful  Trunk R SB Limited, painful  Trunk L SB WFL  Trunk R rotation Limited, pulling  Trunk L rotation Limited, pulling   SLR:- Scour: + bilaterally FABER + bilaterally IT band tight bilaterally and tender to palpation 1cm leg length discrepancy with true and apparent measurements Bridge=painful TA contractions in seated PPT in seated Seated marches     See HEP/ handouts.  Instructed in seated TrA ex./ pelvic tilts due to pain limited in supine position.     Pt response to medical necessity: Pt. Will benefit from skilled physical therapy to decrease episodes of pain, increase functional strength, and body mechanics to return to activities of daily living.         PT Education - 08/31/16 1649    Education provided Yes   Education Details HEP   Person(s) Educated Patient   Methods Demonstration;Explanation;Handout   Comprehension Verbalized  understanding;Returned demonstration             PT Long Term Goals - 08/31/16 1702      PT LONG TERM GOAL #1   Title Pt. will complete LEFS and increase to >50 out of 80 to improve pain free mobility.   Baseline 2/26: TBD   Time 4   Period Weeks   Status New     PT LONG TERM GOAL #2   Title Pt. will ambulate 250 ft with proper body mechanics/ gait pattern to improve mobility in the community.    Baseline Pt. ambulates with an antalgic gait and limited trunk rotation.    Time 4   Period Weeks   Status New     PT LONG TERM GOAL #3   Title Pt. will perform 5 sit to stands with no hip/lumbar pain to improve pain-free mobility.   Baseline Patient has pain with initial sit to stand and first steps   Time 4   Period Weeks   Status New     PT LONG TERM GOAL #4   Title Patient will sleep in bed for one night without pain to return to prior level of function.    Baseline Patient is unable to sleep in bed and must sleep in recliner.    Time 4   Period Weeks   Status New            Plan - 08/31/16 1650    Clinical Impression Statement Patient is a pleasant 66 year old female who presented to physical therapy for low back pain.  Pt.'s pain varies on the day and was not flared during evaluation. Pt. presents with hip and lumbar involvement with musculature and postural compensations. Cramping of left hamstring occurred intermittently throughout session. Slight leg length discrepancy was noted with both apparent and true leg length tests by ~1 cm. Pt. tolerated lying flat on table for short durations but felt more comfortable in chair. IT band tightness was noted bilaterally and tender upon palpation. Single knee to chest was comfortable, but double knee to chest was not and caused cramping of hamstring. Hamstring length was [facility] bilaterally. Scours +, FABER+ , OA feeling at end feel. LAD pain relieving bilaterally. Pt. has weak hip flexion L>R and functional lower BLE strength.  Abdominal control activities were taught to pt and given as an HEP. Patient will benefit from skilled physical therapy to decrease pain occurrences, improve standing and ambulation body mechanics and capacity in order to return patient to activities of daily life.    Rehab Potential Good  Clinical Impairments Affecting Rehab Potential hx of foot, heart, and knee   PT Frequency 2x / week   PT Duration 4 weeks   PT Treatment/Interventions ADLs/Self Care Home Management;Aquatic Therapy;Cryotherapy;Electrical Stimulation;Iontophoresis 4mg /ml Dexamethasone;Moist Heat;Traction;Ultrasound;DME Instruction;Gait training;Stair training;Functional mobility training;Therapeutic activities;Therapeutic exercise;Balance training;Neuromuscular re-education;Patient/family education;Manual techniques;Passive range of motion;Dry needling   PT Next Visit Plan review HEP   PT Home Exercise Plan See handouts   Consulted and Agree with Plan of Care Patient      Patient will benefit from skilled therapeutic intervention in order to improve the following deficits and impairments:  Abnormal gait, Decreased activity tolerance, Decreased coordination, Decreased balance, Decreased mobility, Decreased endurance, Decreased strength, Difficulty walking, Increased muscle spasms, Impaired flexibility, Improper body mechanics, Postural dysfunction, Pain, Decreased range of motion  Visit Diagnosis: Chronic bilateral low back pain, with sciatica presence unspecified  Other abnormalities of gait and mobility  Pain in left hip  Pain in right hip      G-Codes - 09-05-2016 [id number]    Functional Assessment Tool Used (Outpatient Only) LEFS/ clinical impression/ pain/ joint stiffness/ muscle weakness   Functional Limitation Mobility: Walking and moving around   Mobility: Walking and Moving Around Current Status [id number]) At least 20 percent but less than 40 percent impaired, limited or restricted   Mobility: Walking and Moving Around  Goal Status [phone]) At least 1 percent but less than 20 percent impaired, limited or restricted       Problem List Patient Active Problem List   Diagnosis Date Noted  . Atypical chest pain 07/30/2016  . Essential hypertension 07/30/2016   Pura Spice, PT, DPT # [id number] Janna Arch, SPT 2016-09-05, 8:31 AM  [place] [facility] [address] [place], [place], [address] Phone: [phone]   Fax:  [phone]  Name: Misty Hebert MRN: [id number] Date of Birth: [birth year]

## 2016-09-01 ENCOUNTER — Encounter: Payer: Self-pay | Admitting: Physical Therapy

## 2016-09-02 ENCOUNTER — Ambulatory Visit: Payer: Medicare Other | Admitting: Physical Therapy

## 2016-09-02 ENCOUNTER — Encounter: Payer: Self-pay | Admitting: Physical Therapy

## 2016-09-02 DIAGNOSIS — M25551 Pain in right hip: Secondary | ICD-10-CM

## 2016-09-02 DIAGNOSIS — M25552 Pain in left hip: Secondary | ICD-10-CM

## 2016-09-02 DIAGNOSIS — M545 Low back pain: Principal | ICD-10-CM

## 2016-09-02 DIAGNOSIS — R2689 Other abnormalities of gait and mobility: Secondary | ICD-10-CM

## 2016-09-02 DIAGNOSIS — G8929 Other chronic pain: Secondary | ICD-10-CM

## 2016-09-02 NOTE — Therapy (Signed)
[facility] [address] [place], [place], [address] Phone: [phone]   Fax:  [phone]  Physical Therapy Treatment  Patient Details  Name: Misty Hebert MRN: [id number] Date of Birth: 11-30-1950 Referring Provider: Dr. Dorcas Mcmurray MD  Encounter Date: 09/02/2016      PT End of Session - 09/02/16 1240    Visit Number 2   Number of Visits 8   Date for PT Re-Evaluation 09/28/16   Authorization - Visit Number 2   Authorization - Number of Visits 10   PT Start Time 0920   PT Stop Time [id number]   PT Time Calculation (min) 48 min   Activity Tolerance Patient tolerated treatment well;No increased pain   Behavior During Therapy WFL for tasks assessed/performed      Past Medical History:  Diagnosis Date  . Arthritis of knee   . Benign essential HTN   . Depression   . Diverticulitis of intestine w/o perforation or abscess w/o bleeding   . DVT (deep venous thrombosis) ([place])   . Dysphagia   . Fibromyalgia   . High cholesterol   . History of colon resection   . Hyperlipidemia   . Hypertension   . Medial meniscus tear   . Migraine   . Neuromuscular disorder ([place])   . Obesity   . Overactive bladder   . Reflux   . Subchondral insufficiency fracture of condyle of femur ([place])   . Tension headache     Past Surgical History:  Procedure Laterality Date  . ABDOMINAL HYSTERECTOMY    . BREAST BIOPSY Right    CYST REMOVED-16-35YRS AGO-NEG  . CARDIAC CATHETERIZATION  1995   [facility]  . CARDIAC CATHETERIZATION  2001   [facility]  . CARDIAC CATHETERIZATION  2006   [facility]  . CARDIAC CATHETERIZATION     [facility] with Dr. Nehemiah Massed   . COLON RESECTION    . FOOT SURGERY Left 01/10/2016   03/06/16 second foot surgery  . KNEE SURGERY    . SHOULDER SURGERY      There were no vitals filed for this visit.      Subjective Assessment - 09/02/16 0920    Subjective Pt. states she is doing okay but reports a fall the other day  while using crockpot in kitchen.  Pt. states fall was a result of slipping on water and is sore in multiple areas, esp. R inf. scapular border/ back.     Pertinent History hx of foot surgeries, knee problems,    Limitations Standing;Walking;House hold activities;Lifting   Patient Stated Goals decrease pain, walk without pain   Currently in Pain? Yes   Pain Score 4    Pain Location Back   Pain Orientation Right;Left     Manual:  LAD in multiaxial planes 7x30 seconds in supine position. Relieved pain. PROM: 1x60 seconds hamstrings, piriformis, IT band.  Nerve glides/flossing 4x30 seconds supine.   TherEx: TA contraction 5x5 seconds TA contraction marching in supine with elevated back TherEx ball UE dead bugs in seated.  Squats at // bars 2x10. Cues for depth of squat and foot positioning. Marching at // bars 1x10 each leg. Slow large amplitude motions to encourage single limb stance Hip Abduction at // bar 1x10 each leg with cues for body mechanics. Compensation with hip flexion noted and corrected. Hip extension at // bars 1x10 each leg with cues for tightened abdominals to decrease forward trunk lean   Pt. Response to medical necessity:  Patient had increased pain due to recent fall. Patient will benefit from continued skilled physical therapy services to decrease pain occurences, improve standing and ambulation body mechanics and capacity to return patient to activities of daily living.       PT Long Term Goals - 08/31/16 1702      PT LONG TERM GOAL #1   Title Pt. will complete LEFS and increase to >50 out of 80 to improve pain free mobility.   Baseline 2/26: TBD   Time 4   Period Weeks   Status New     PT LONG TERM GOAL #2   Title Pt. will ambulate 250 ft with proper body mechanics/ gait pattern to improve mobility in the community.    Baseline Pt. ambulates with an antalgic gait and limited trunk rotation.    Time 4   Period Weeks   Status New     PT LONG TERM GOAL #3    Title Pt. will perform 5 sit to stands with no hip/lumbar pain to improve pain-free mobility.   Baseline Patient has pain with initial sit to stand and first steps   Time 4   Period Weeks   Status New     PT LONG TERM GOAL #4   Title Patient will sleep in bed for one night without pain to return to prior level of function.    Baseline Patient is unable to sleep in bed and must sleep in recliner.    Time 4   Period Weeks   Status New            Plan - 09/02/16 1241    Clinical Impression Statement Patient presented to therapy today in increased pain due to recent slip and fall. The patient slipped in the kitchen and fell hurting their back, knees, and ankle. Pt. was feeling stiff and had concurrent tight musculature. Laying flat on table is not tolerated but supine LAD felt good and relieved back pain. Pain was decreased throughout session from 4/10 to 0/10 with manual, stretching, and activity. Pt. will benefit from continued skilled physical therapy to decrease pain, improve standing and ambulation body mechanics and capacity in order to return patient to activities of daily life.    Rehab Potential Good   Clinical Impairments Affecting Rehab Potential hx of foot, heart, and knee   PT Frequency 2x / week   PT Duration 4 weeks   PT Treatment/Interventions ADLs/Self Care Home Management;Aquatic Therapy;Cryotherapy;Electrical Stimulation;Iontophoresis 4mg /ml Dexamethasone;Moist Heat;Traction;Ultrasound;DME Instruction;Gait training;Stair training;Functional mobility training;Therapeutic activities;Therapeutic exercise;Balance training;Neuromuscular re-education;Patient/family education;Manual techniques;Passive range of motion;Dry needling   PT Next Visit Plan LAD, IT band stretch, standing stretch, core work, LE strengthening   PT Home Exercise Plan See handouts   Consulted and Agree with Plan of Care Patient      Patient will benefit from skilled therapeutic intervention in order  to improve the following deficits and impairments:  Abnormal gait, Decreased activity tolerance, Decreased coordination, Decreased balance, Decreased mobility, Decreased endurance, Decreased strength, Difficulty walking, Increased muscle spasms, Impaired flexibility, Improper body mechanics, Postural dysfunction, Pain, Decreased range of motion  Visit Diagnosis: Chronic bilateral low back pain, with sciatica presence unspecified  Other abnormalities of gait and mobility  Pain in left hip  Pain in right hip     Problem List Patient Active Problem List   Diagnosis Date Noted  . Atypical chest pain 07/30/2016  . Essential hypertension 07/30/2016   Pura Spice, PT, DPT # [place], SPT 09/02/2016, 5:21 PM  [facility] [address] [place], [place], [address] Phone: [phone]   Fax:  [phone]  Name: WAHNETA BAVA MRN: [id number] Date of Birth: 1951/03/27

## 2016-09-03 ENCOUNTER — Ambulatory Visit: Payer: Medicare Other | Admitting: Physical Therapy

## 2016-09-08 ENCOUNTER — Encounter: Payer: Self-pay | Admitting: Physical Therapy

## 2016-09-08 ENCOUNTER — Ambulatory Visit: Payer: Medicare Other | Attending: Physical Medicine & Rehabilitation | Admitting: Physical Therapy

## 2016-09-08 DIAGNOSIS — M25551 Pain in right hip: Secondary | ICD-10-CM | POA: Diagnosis present

## 2016-09-08 DIAGNOSIS — R2689 Other abnormalities of gait and mobility: Secondary | ICD-10-CM | POA: Diagnosis present

## 2016-09-08 DIAGNOSIS — G8929 Other chronic pain: Secondary | ICD-10-CM | POA: Diagnosis present

## 2016-09-08 DIAGNOSIS — M25552 Pain in left hip: Secondary | ICD-10-CM | POA: Insufficient documentation

## 2016-09-08 DIAGNOSIS — M545 Low back pain: Secondary | ICD-10-CM | POA: Diagnosis not present

## 2016-09-08 NOTE — Therapy (Signed)
[facility] [address] [place], [place], [address] Phone: [phone]   Fax:  [phone]  Physical Therapy Treatment  Patient Details  Name: Misty Hebert MRN: [id number] Date of Birth: 03/08/1951 Referring Provider: Dr. Dorcas Mcmurray MD  Encounter Date: 09/08/2016      PT End of Session - 09/08/16 1019    Visit Number 3   Number of Visits 8   Date for PT Re-Evaluation 09/28/16   Authorization - Visit Number 3   Authorization - Number of Visits 10   PT Start Time 0914   PT Stop Time 1009   PT Time Calculation (min) 55 min   Activity Tolerance Patient tolerated treatment well   Behavior During Therapy [facility] for tasks assessed/performed      Past Medical History:  Diagnosis Date  . Arthritis of knee   . Benign essential HTN   . Depression   . Diverticulitis of intestine w/o perforation or abscess w/o bleeding   . DVT (deep venous thrombosis) ([place])   . Dysphagia   . Fibromyalgia   . High cholesterol   . History of colon resection   . Hyperlipidemia   . Hypertension   . Medial meniscus tear   . Migraine   . Neuromuscular disorder ([place])   . Obesity   . Overactive bladder   . Reflux   . Subchondral insufficiency fracture of condyle of femur ([place])   . Tension headache     Past Surgical History:  Procedure Laterality Date  . ABDOMINAL HYSTERECTOMY    . BREAST BIOPSY Right    CYST REMOVED-16-58YRS AGO-NEG  . CARDIAC CATHETERIZATION  1995   [facility]  . CARDIAC CATHETERIZATION  2001   [facility]  . CARDIAC CATHETERIZATION  2006   [facility]  . CARDIAC CATHETERIZATION     [facility] with Dr. Nehemiah Massed   . COLON RESECTION    . FOOT SURGERY Left 01/10/2016   03/06/16 second foot surgery  . KNEE SURGERY    . SHOULDER SURGERY      There were no vitals filed for this visit.      Subjective Assessment - 09/08/16 1016    Subjective Pt. reports pain on thursday in left upper thoracic/scapular region, pain in right  hip/groin on friday, and continuing pain in thoracic region today. 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    Pertinent History hx of foot surgeries, knee problems,    Limitations Standing;Walking;House hold activities;Lifting   Patient Stated Goals decrease pain, walk without pain   Currently in Pain? Yes   Pain Score 4    Pain Location Thoracic   Pain Orientation Left     Manual: Seated thoracic mobilizations. Grade I-II CPA's nonpainful, hypomobile.  Grade I-II UPA's painful on left thoracic region T7-t10. 30 seconds each level x4.  Soft tissue massage thoracic scapular region L & R 5 minutes each side. Decreased sensitization with continuation.    TherEx: Seated: Abduction blue theraband 2x15 with 5 second holds at end range.  Adduction: ball 2x15 Standing forward flexion thoracic stretch at // bars 2x30 seconds Standing thoracic roll out on TherEx with plinth table 2x15 with 4 second holds at end range  Heat applied to thoracic region to reduce sensitivity to touch.    Pt. Response to medical necessity: Patient had increased pain due to recent fall. Patient will benefit from continued skilled physical therapy services to decrease pain occurences, improve standing and ambulation body  mechanics and capacity to return patient to activities of daily living.         PT Education - 09/08/16 1019    Education provided Yes   Education Details see sheet   Person(s) Educated Patient   Methods Explanation;Demonstration   Comprehension Verbalized understanding;Returned demonstration             PT Long Term Goals - 08/31/16 1702      PT LONG TERM GOAL #1   Title Pt. will complete LEFS and increase to >50 out of 80 to improve pain free mobility.   Baseline 2/26: TBD   Time 4   Period Weeks   Status New     PT LONG TERM GOAL #2   Title Pt. will ambulate 250 ft with proper body mechanics/ gait pattern to improve mobility in the community.    Baseline Pt.  ambulates with an antalgic gait and limited trunk rotation.    Time 4   Period Weeks   Status New     PT LONG TERM GOAL #3   Title Pt. will perform 5 sit to stands with no hip/lumbar pain to improve pain-free mobility.   Baseline Patient has pain with initial sit to stand and first steps   Time 4   Period Weeks   Status New     PT LONG TERM GOAL #4   Title Patient will sleep in bed for one night without pain to return to prior level of function.    Baseline Patient is unable to sleep in bed and must sleep in recliner.    Time 4   Period Weeks   Status New            Plan - 09/08/16 1244    Clinical Impression Statement Pt. presented to therapy today with left thoracic pain. Heat to thoracic region reduced sensitivity to touch and allowed PT to perform seated UPA's and CPA's. CPA's grade I-II to thoracic region were not painful but Grade I UPA's to left thoracic region T7-T10 were painful. Soft tissue massage of left thoracic scapular region decreased muscular tightness. Pt. was educated and taught thoracic/scapular stretching to perform in standing and while seated with heat pad. Pt. performed seated TherEx with a focus on sustained contraction with good body mechanics and no increased pain. Pt. will benefit from continued skilled physical therapy to decrease pain, improve standing and ambulation body mechanics and capacity in order to return patient to activities of daily living.    Rehab Potential Good   Clinical Impairments Affecting Rehab Potential hx of foot, heart, and knee   PT Frequency 2x / week   PT Duration 4 weeks   PT Treatment/Interventions ADLs/Self Care Home Management;Aquatic Therapy;Cryotherapy;Electrical Stimulation;Iontophoresis 4mg /ml Dexamethasone;Moist Heat;Traction;Ultrasound;DME Instruction;Gait training;Stair training;Functional mobility training;Therapeutic activities;Therapeutic exercise;Balance training;Neuromuscular re-education;Patient/family  education;Manual techniques;Passive range of motion;Dry needling   PT Next Visit Plan LE strength, core strengthening, thoracic stretch   PT Home Exercise Plan See handouts   Consulted and Agree with Plan of Care Patient      Patient will benefit from skilled therapeutic intervention in order to improve the following deficits and impairments:  Abnormal gait, Decreased activity tolerance, Decreased coordination, Decreased balance, Decreased mobility, Decreased endurance, Decreased strength, Difficulty walking, Increased muscle spasms, Impaired flexibility, Improper body mechanics, Postural dysfunction, Pain, Decreased range of motion  Visit Diagnosis: Chronic bilateral low back pain, with sciatica presence unspecified  Other abnormalities of gait and mobility  Pain in left hip  Pain in right hip  Problem List Patient Active Problem List   Diagnosis Date Noted  . Atypical chest pain 07/30/2016  . Essential hypertension 07/30/2016   Pura Spice, PT, DPT # [id number] Janna Arch, SPT 09/08/2016, 12:45 PM  [facility] [address] [place], [place], [address] Phone: [phone]   Fax:  [phone]  Name: Misty Hebert MRN: [id number] Date of Birth: 28-Dec-1950

## 2016-09-10 ENCOUNTER — Encounter: Payer: Medicare Other | Admitting: Physical Therapy

## 2016-09-15 ENCOUNTER — Encounter: Payer: Medicare Other | Admitting: Physical Therapy

## 2016-09-17 ENCOUNTER — Encounter: Payer: Medicare Other | Admitting: Physical Therapy

## 2016-09-22 ENCOUNTER — Ambulatory Visit: Payer: Medicare Other | Admitting: Physical Therapy

## 2016-09-24 ENCOUNTER — Encounter: Payer: Medicare Other | Admitting: Physical Therapy

## 2016-09-29 ENCOUNTER — Encounter: Payer: Medicare Other | Admitting: Physical Therapy

## 2016-10-01 ENCOUNTER — Encounter: Payer: Medicare Other | Admitting: Physical Therapy

## 2016-10-28 ENCOUNTER — Ambulatory Visit (INDEPENDENT_AMBULATORY_CARE_PROVIDER_SITE_OTHER): Payer: Medicare Other | Admitting: Podiatry

## 2016-10-28 DIAGNOSIS — M7752 Other enthesopathy of left foot: Secondary | ICD-10-CM | POA: Diagnosis not present

## 2016-10-28 DIAGNOSIS — Q828 Other specified congenital malformations of skin: Secondary | ICD-10-CM | POA: Diagnosis not present

## 2016-10-28 DIAGNOSIS — M778 Other enthesopathies, not elsewhere classified: Secondary | ICD-10-CM

## 2016-10-28 DIAGNOSIS — M779 Enthesopathy, unspecified: Principal | ICD-10-CM

## 2016-10-28 DIAGNOSIS — M2042 Other hammer toe(s) (acquired), left foot: Secondary | ICD-10-CM

## 2016-10-28 NOTE — Progress Notes (Signed)
She presents today complaining of pain in the left foot. He states that this corn really bothers me with shoe gear.  Objective: Vital signs stable she is alert and oriented 3 pulses to the left foot are intact. 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 She also has pain on range of motion PIPJ more than likely arthritic.  Assessment: Hammertoe deformity fifth left with capsulitis and reactive calluses.  Plan: I injected the PIPJ today with 1 mL 50-50 mixture dexamethasone and local anesthetic. I debrided reactive hyperkeratosis I talked to her about surgery in the near future.

## 2016-11-23 DIAGNOSIS — J029 Acute pharyngitis, unspecified: Secondary | ICD-10-CM | POA: Insufficient documentation

## 2016-11-23 DIAGNOSIS — R0600 Dyspnea, unspecified: Secondary | ICD-10-CM | POA: Insufficient documentation

## 2016-11-23 DIAGNOSIS — B009 Herpesviral infection, unspecified: Secondary | ICD-10-CM | POA: Insufficient documentation

## 2017-10-15 ENCOUNTER — Telehealth: Payer: Self-pay | Admitting: Internal Medicine

## 2017-10-15 NOTE — Telephone Encounter (Signed)
Patient last seen 07/2016. Called patient to let her know she would need office visit before receiving clearance. She is agreeable to come in and see Ignacia Bayley, NP on 10/18/17. Appointment scheduled. Routing to [place].

## 2017-10-15 NOTE — Telephone Encounter (Signed)
° °  [facility] Pre-operative Risk Assessment    Request for surgical clearance:  1. What type of surgery is being performed? Right Total interior Hip Replacement   2. When is this surgery scheduled? 12/17/17   3. What type of clearance is required (medical clearance vs. Pharmacy clearance to hold med vs. Both)? Not listed   4. Are there any medications that need to be held prior to surgery and how long? Not listed   5. Practice name and name of physician performing surgery? [facility] Dr Kurtis Bushman  6. What is your office phone number [phone]   7.   What is your office fax number [phone]  8.   Anesthesia type (None, local, MAC, general) ? Unclear

## 2017-10-18 ENCOUNTER — Encounter: Payer: Self-pay | Admitting: Nurse Practitioner

## 2017-10-18 ENCOUNTER — Ambulatory Visit (INDEPENDENT_AMBULATORY_CARE_PROVIDER_SITE_OTHER): Payer: Medicare Other | Admitting: Nurse Practitioner

## 2017-10-18 VITALS — BP 180/100 | HR 90 | Ht 63.0 in | Wt 235.5 lb

## 2017-10-18 DIAGNOSIS — R1013 Epigastric pain: Secondary | ICD-10-CM

## 2017-10-18 DIAGNOSIS — Z01810 Encounter for preprocedural cardiovascular examination: Secondary | ICD-10-CM

## 2017-10-18 DIAGNOSIS — I10 Essential (primary) hypertension: Secondary | ICD-10-CM

## 2017-10-18 DIAGNOSIS — K449 Diaphragmatic hernia without obstruction or gangrene: Secondary | ICD-10-CM | POA: Diagnosis not present

## 2017-10-18 MED ORDER — CARVEDILOL 3.125 MG PO TABS: 3.1250 mg | ORAL_TABLET | Freq: Two times a day (BID) | ORAL | 5 refills | Status: DC

## 2017-10-18 MED ORDER — LOSARTAN POTASSIUM 100 MG PO TABS: 100.0000 mg | ORAL_TABLET | Freq: Every day | ORAL | 3 refills | Status: DC

## 2017-10-18 NOTE — Patient Instructions (Signed)
Medication Instructions:  Your physician has recommended you make the following change in your medication:  1- STOP Lisinopril. 2- INCREASE Losartan to 100 mg by mouth once a day. 3- START Coreg 3.125 mg (1 tablet) by mouth two times a day.   Labwork: Your physician recommends that you return for lab work in: [place] BP CHECK. (BMET).   Testing/Procedures: none  Follow-Up: Your physician recommends that you schedule a follow-up appointment in: [place].  Your physician recommends that you schedule a follow-up appointment in: [place].    If you need a refill on your cardiac medications before your next appointment, please call your pharmacy.

## 2017-10-18 NOTE — Progress Notes (Signed)
Office Visit    Patient Name: Misty Hebert Date of Encounter: 10/18/2017  Primary Care Provider:  Gayland Curry, MD Primary Cardiologist:  Nelva Bush, MD  Chief Complaint    67 year old female with a history of hypertension, hyperlipidemia, chronic intermittent chest and epigastric pain, fibromyalgia, depression, DVT, GERD, hiatal hernia, and degenerative joint disease pending right total hip arthroplasty, who presents for preoperative evaluation.  Past Medical History    Past Medical History:  Diagnosis Date  . Arthritis of knee   . Benign essential HTN   . Chest pain    a. she reports 4 normal heart caths; b. 12/2013 St Echo: nl Jefm Bryant); c. 04/2016 MV: apical and ant defect @ rest consistent w/ breast attenuation. No ischemia. EF 55-65%.  . Depression   . Diverticulitis of intestine w/o perforation or abscess w/o bleeding   . DVT (deep venous thrombosis) ([place])   . Dysphagia   . Fibromyalgia   . History of colon resection   . History of echocardiogram    a. 01/2017 Echo ([facility]): nl LV syst fxn.  . Hyperlipidemia   . Medial meniscus tear   . Migraine   . Neuromuscular disorder ([place])   . Obesity   . Overactive bladder   . Reflux   . Subchondral insufficiency fracture of condyle of femur ([place])   . Tension headache    Past Surgical History:  Procedure Laterality Date  . ABDOMINAL HYSTERECTOMY    . BREAST BIOPSY Right    CYST REMOVED-16-39YRS AGO-NEG  . CARDIAC CATHETERIZATION  1995   [facility]  . CARDIAC CATHETERIZATION  2001   [facility]  . CARDIAC CATHETERIZATION  2006   [facility]  . CARDIAC CATHETERIZATION     [facility] with Dr. Nehemiah Massed   . COLON RESECTION    . FOOT SURGERY Left 01/10/2016   03/06/16 second foot surgery  . KNEE SURGERY    . SHOULDER SURGERY      Allergies  Allergies  Allergen Reactions  . Sulfamethizole Nausea And Vomiting  . Thorazine [Chlorpromazine] Anaphylaxis and Other (See Comments)    Other Reaction: LOC    . Celebrex [Celecoxib] Nausea Only  . Meloxicam Other (See Comments)  . Reglan [Metoclopramide] Other (See Comments)    Other Reaction: RESTLESS Twitching , cant sit still     History of Present Illness    67 year old female with a history of hypertension, hyperlipidemia, chronic intermittent chest and epigastric pain, fibromyalgia, depression, DVT, GERD, hiatal hernia, and degenerative joint disease pending right total hip arthroplasty.  She is currently pending right total hip arthroplasty which is scheduled for early summer.  She was last seen in clinic here in January 2018.  She has a long, nearly 30-year history of intermittent chest and epigastric pain that comes suddenly, almost exclusively with rest, lasts up to 30 minutes, and resolves either spontaneously or after using Bentyl.  She reports having multiple cardiac catheterizations as well as stress tests, all of which were reportedly normal.  She did have a negative/low risk Myoview in October 2017.  Over the past year, symptoms have been relatively stable.  She continues to experience intermittent sharp epigastric pain a few times a month.  She also reports chronic dyspnea on exertion that worsened some last summer after coming down with pneumonia twice.  She says she can walk on flat surfaces without too much limitation but believes that she has really gotten out of shape and so any heavier paste exertion is  difficult for her.  She denies PND, orthopnea, dizziness, syncope, edema, or early satiety.  Of note, blood pressure is 180/100 today.  She is not really sure what it runs at home.  Home Medications    Prior to Admission medications   Medication Sig Start Date End Date Taking? Authorizing Provider  acetaminophen (TYLENOL) 325 MG tablet Take 650 mg by mouth every 6 (six) hours as needed.   Yes [provider]  aspirin EC 81 MG tablet Take 1 tablet (81 mg total) by mouth daily. 04/28/16  Yes End, Harrell Gave, MD   atorvastatin (LIPITOR) 80 MG tablet Take 80 mg by mouth daily at 6 PM.  08/15/13  Yes [provider]  dicyclomine (BENTYL) 20 MG tablet Take 1 tablet (20 mg total) by mouth 3 (three) times daily as needed for spasms. 04/20/16 10/18/17 Yes Nance Pear, MD  FLUoxetine (PROZAC) 20 MG capsule Take 20 mg by mouth daily.  08/22/13  Yes [provider]  fluticasone furoate-vilanterol (BREO ELLIPTA) 100-25 MCG/INH AEPB Inhale 1 puff into the lungs as needed.    Yes [provider]  ipratropium-albuterol (DUONEB) 0.5-2.5 (3) MG/3ML SOLN Inhale into the lungs as needed. 11/10/16 11/05/17 Yes [provider]  oxyCODONE-acetaminophen (PERCOCET) 7.5-325 MG tablet Take 1 tablet by mouth as needed.    Yes [provider]  pantoprazole (PROTONIX) 40 MG tablet Take 40 mg by mouth 2 (two) times daily.  08/15/13  Yes [provider]  pravastatin (PRAVACHOL) 40 MG tablet Take 40 mg by mouth daily.  09/02/16  Yes [provider]  traZODone (DESYREL) 50 MG tablet Take by mouth as needed. 09/02/16  Yes [provider]  carvedilol (COREG) 3.125 MG tablet Take 1 tablet (3.125 mg total) by mouth 2 (two) times daily with a meal. 10/18/17   Theora Gianotti, NP  losartan (COZAAR) 100 MG tablet Take 1 tablet (100 mg total) by mouth daily. 10/18/17 01/16/18  Theora Gianotti, NP    Review of Systems    Chronic dyspnea on exertion and also intermittent sharp epigastric pain as outlined above.  She denies exertional chest pain, palpitations, PND, orthopnea, dizziness, syncope, edema, or early satiety.  All other systems reviewed and are otherwise negative except as noted above.  Physical Exam    VS:  BP (!) 180/100 (BP Location: Left Arm, Patient Position: Sitting, Cuff Size: Large)   Pulse 90   Ht 5\' 3"  (1.6 m)   Wt 235 lb 8 oz (106.8 kg)   LMP  (LMP Unknown)   BMI 41.72 kg/m  , BMI Body mass index is 41.72 kg/m. GEN: Obese urished,  well developed, in no acute distress.  HEENT: normal.  Neck: Supple, obese, difficult to gauge JVP, no carotid bruits, or masses. Cardiac: RRR, no murmurs, rubs, or gallops. No clubbing, cyanosis, edema.  Radials/DP/PT 2+ and equal bilaterally.  Respiratory:  Respirations regular and unlabored, clear to auscultation bilaterally. GI: Soft, nontender, nondistended, BS + x 4. MS: no deformity or atrophy. Skin: warm and dry, no rash. Neuro:  Strength and sensation are intact. Psych: Normal affect.  Accessory Clinical Findings    ECG -regular sinus rhythm, 90, left axis deviation, no acute changes.  Assessment & Plan    1.  Preoperative cardiovascular evaluation: Patient is pending right total hip arthroplasty.  At baseline, activity is limited both in the setting of right hip pain and also chronic dyspnea on exertion.  She also experiences intermittent sharp epigastric pain which she has  had for approximately 30 years.  Symptoms do not typically occur with exertion.  She had similar symptoms of both intermittent epigastric pain and dyspnea when evaluated with stress testing in October 2017.  That testing was low risk.  Nl EF by echo @ [facility] last summer.  I think the bigger issue today is a blood pressure of 180/100.  I will work on blood pressure lowering as outlined below and hold off on preoperative clearance for the time being.  2.  Essential hypertension: Blood pressure 180/100.  I did repeat this and got 180/96.  She is on losartan 50 mg daily only.  I will increase this to 100 mg and I am also adding carvedilol 3.125 mg twice daily.  We will bring her back in 1 week for follow-up basic metabolic panel and nurse visit for blood pressure check.  Plan to see her back in clinic in approximately 1 month or sooner if necessary.  3.  Hyperlipidemia: She is on statin therapy and this is followed by her primary care provider.  4.  Chronic intermittent epigastric pain/hiatal hernia: This has been  stable over time and seems to improve with as needed Bentyl therapy.  5.  Disposition: Follow-up blood pressure check and lab work in 1 week.  Follow-up in clinic in approximately 1 month or sooner if necessary.  Murray Hodgkins, NP 10/18/2017, 5:52 PM

## 2017-10-25 ENCOUNTER — Other Ambulatory Visit (INDEPENDENT_AMBULATORY_CARE_PROVIDER_SITE_OTHER): Payer: Medicare Other

## 2017-10-25 ENCOUNTER — Ambulatory Visit (INDEPENDENT_AMBULATORY_CARE_PROVIDER_SITE_OTHER): Payer: Medicare Other

## 2017-10-25 VITALS — BP 128/82 | HR 72 | Resp 16

## 2017-10-25 DIAGNOSIS — I10 Essential (primary) hypertension: Secondary | ICD-10-CM

## 2017-10-25 NOTE — Patient Instructions (Addendum)
1.) Reason for visit: BP check  2.) Name of MD requesting visit: Murray Hodgkins, NP  3.) H&P: Essential hypertension and atypical chest pain  4.) ROS related to problem: Pt's BP 180/100 at 10/18/17 OV. Lisinopril stopped, losartan increased to 100mg  QD, coreg 3.125mg  BID added. Patient reports having BP checked last week at blood drive and it was 563/87. Since medication change, she has felt dizzy on an occasion or two. Resolved on its own. She does not have BP cuff at home. Discussed purchasing for home monitoring. Otherwise, she has felt well.    5.) Assessment and plan per MD: BP 128/82, HR 71 at today's visit. She will continue current medications, monitor BP and HR at home and call if dizziness increases.    Routed to Ignacia Bayley, NP.

## 2017-10-26 ENCOUNTER — Other Ambulatory Visit: Payer: Self-pay | Admitting: *Deleted

## 2017-10-26 DIAGNOSIS — Z79899 Other long term (current) drug therapy: Secondary | ICD-10-CM

## 2017-10-26 DIAGNOSIS — I10 Essential (primary) hypertension: Secondary | ICD-10-CM

## 2017-10-26 LAB — BASIC METABOLIC PANEL
BUN/Creatinine Ratio: 10 — ABNORMAL LOW (ref 12–28)
BUN: 8 mg/dL (ref 8–27)
CHLORIDE: 108 mmol/L — AB (ref 96–106)
CO2: 20 mmol/L (ref 20–29)
Calcium: 9.1 mg/dL (ref 8.7–10.3)
Creatinine, Ser: 0.79 mg/dL (ref 0.57–1.00)
GFR calc Af Amer: 90 mL/min/{1.73_m2} (ref >59)
GFR calc non Af Amer: 78 mL/min/{1.73_m2} (ref >59)
Glucose: 128 mg/dL — ABNORMAL HIGH (ref 65–99)
Potassium: 5 mmol/L (ref 3.5–5.2)
Sodium: 143 mmol/L (ref 134–144)

## 2017-11-09 ENCOUNTER — Telehealth: Payer: Self-pay | Admitting: *Deleted

## 2017-11-09 ENCOUNTER — Other Ambulatory Visit (INDEPENDENT_AMBULATORY_CARE_PROVIDER_SITE_OTHER): Payer: Medicare Other

## 2017-11-09 DIAGNOSIS — I10 Essential (primary) hypertension: Secondary | ICD-10-CM

## 2017-11-09 DIAGNOSIS — Z79899 Other long term (current) drug therapy: Secondary | ICD-10-CM

## 2017-11-09 NOTE — Telephone Encounter (Signed)
Patient here in office today for lab work. She has complaint of lower leg and ankle swelling. It occurs on days when she is on her feet frequently. By the end of those days, they are very swollen. When she wakes up the next day, the swelling is gone. On days she is not as active the swelling occurs very little it any. She does not recall having this before starting the carvedilol and increasing her losartan recently. She wonders if its from the medicine or if anything needs to be changed.  Advised patient to elevate legs when possible and will route to Ignacia Bayley, NP for further advice.

## 2017-11-10 LAB — BASIC METABOLIC PANEL
BUN/Creatinine Ratio: 13 (ref 12–28)
BUN: 10 mg/dL (ref 8–27)
CO2: 22 mmol/L (ref 20–29)
Calcium: 9.2 mg/dL (ref 8.7–10.3)
Chloride: 104 mmol/L (ref 96–106)
Creatinine, Ser: 0.8 mg/dL (ref 0.57–1.00)
GFR calc Af Amer: 89 mL/min/{1.73_m2} (ref >59)
GFR calc non Af Amer: 77 mL/min/{1.73_m2} (ref >59)
Glucose: 128 mg/dL — ABNORMAL HIGH (ref 65–99)
Potassium: 4.8 mmol/L (ref 3.5–5.2)
Sodium: 141 mmol/L (ref 134–144)

## 2017-11-10 NOTE — Telephone Encounter (Signed)
Symptoms are not likely to be related to titrating bp medications. I'm more concerned that perhaps bp's are continuing to trend up, which results in some amount of volume excess and dependent edema.  How have bp's been @ home?  Agree with leg elevation and also consider compression stockings during the day.  She may need a diuretic, but prefer to try simpler measures first, unless bp's have continued to trend up.  Lab Results  Component Value Date   CREATININE 0.80 11/09/2017   BUN 10 11/09/2017   NA 141 11/09/2017   K 4.8 11/09/2017   CL 104 11/09/2017   CO2 22 11/09/2017   Last bmet was wnl.

## 2017-11-11 NOTE — Telephone Encounter (Signed)
Left voicemail message to call back  

## 2017-11-12 NOTE — Telephone Encounter (Signed)
I called and spoke with the patient. She is aware of Ignacia Bayley, NP's recommendations. She states she has not been having issues with her legs swelling until her meds were adjusted. I advised her that lower extremity swelling is typically not a side effect of the meds she is currently taking. She confirms she does not have a way to monitor her BP at home. I have advised her to obtain a home BP cuff and get a reliable brand such as [employer], then check her readings for 4-5 days and call us back with what she is getting. In the interim I have encouraged elevation of her lower extremities as well as compression socks/ hose.  She voices understanding of the above recommendations and is agreeable.

## 2017-11-17 ENCOUNTER — Other Ambulatory Visit: Payer: Self-pay | Admitting: Family Medicine

## 2017-11-17 DIAGNOSIS — G5600 Carpal tunnel syndrome, unspecified upper limb: Secondary | ICD-10-CM | POA: Insufficient documentation

## 2017-11-17 DIAGNOSIS — Z780 Asymptomatic menopausal state: Secondary | ICD-10-CM

## 2017-11-17 DIAGNOSIS — J4530 Mild persistent asthma, uncomplicated: Secondary | ICD-10-CM | POA: Insufficient documentation

## 2017-12-01 ENCOUNTER — Encounter: Payer: Self-pay | Admitting: Nurse Practitioner

## 2017-12-01 ENCOUNTER — Ambulatory Visit (INDEPENDENT_AMBULATORY_CARE_PROVIDER_SITE_OTHER): Payer: Medicare Other | Admitting: Nurse Practitioner

## 2017-12-01 VITALS — BP 120/80 | HR 82 | Ht 63.0 in | Wt 234.5 lb

## 2017-12-01 DIAGNOSIS — Z01810 Encounter for preprocedural cardiovascular examination: Secondary | ICD-10-CM | POA: Diagnosis not present

## 2017-12-01 DIAGNOSIS — I10 Essential (primary) hypertension: Secondary | ICD-10-CM

## 2017-12-01 DIAGNOSIS — E782 Mixed hyperlipidemia: Secondary | ICD-10-CM

## 2017-12-01 MED ORDER — LOSARTAN POTASSIUM 100 MG PO TABS: 100.0000 mg | ORAL_TABLET | Freq: Every day | ORAL | 3 refills | Status: DC

## 2017-12-01 MED ORDER — CARVEDILOL 3.125 MG PO TABS: 3.1250 mg | ORAL_TABLET | Freq: Two times a day (BID) | ORAL | 3 refills | Status: DC

## 2017-12-01 MED ORDER — PRAVASTATIN SODIUM 40 MG PO TABS: 40.0000 mg | ORAL_TABLET | Freq: Every day | ORAL | 3 refills | Status: DC

## 2017-12-01 NOTE — Patient Instructions (Signed)
Medication Instructions: - Your physician has recommended you make the following change in your medication:  1) RESTART pravchol (pravastatin) 40 mg- take 1 tablet by mouth once daily  Labwork: - none ordered  Procedures/Testing: - none ordered  Follow-Up: - Your physician recommends that you schedule a follow-up appointment in: 4 months with Dr. Saunders Revel.   Any Additional Special Instructions Will Be Listed Below (If Applicable).     If you need a refill on your cardiac medications before your next appointment, please call your pharmacy.

## 2017-12-01 NOTE — Progress Notes (Signed)
Office Visit    Patient Name: Misty Hebert Date of Encounter: 12/01/2017  Primary Care Provider:  Gayland Curry, MD Primary Cardiologist:  Nelva Bush, MD  Chief Complaint    67 year old female with a history of hypertension, hyperlipidemia, chronic intermittent chest and epigastric discomfort, fiber myalgia, depression, DVT, GERD, hiatal hernia, and degenerative joint disease pending right total hip arthroplasty, who presents for follow-up.  Past Medical History    Past Medical History:  Diagnosis Date  . Arthritis of knee   . Benign essential HTN   . Chest pain    a. she reports 4 normal heart caths; b. 12/2013 St Echo: nl Jefm Bryant); c. 04/2016 MV: apical and ant defect @ rest consistent w/ breast attenuation. No ischemia. EF 55-65%.  . Depression   . Diverticulitis of intestine w/o perforation or abscess w/o bleeding   . DVT (deep venous thrombosis) ([place])   . Dysphagia   . Fibromyalgia   . History of colon resection   . History of echocardiogram    a. 01/2017 Echo ([facility]): nl LV syst fxn.  . Hyperlipidemia   . Medial meniscus tear   . Migraine   . Neuromuscular disorder ([place])   . Obesity   . Overactive bladder   . Reflux   . Subchondral insufficiency fracture of condyle of femur ([place])   . Tension headache    Past Surgical History:  Procedure Laterality Date  . ABDOMINAL HYSTERECTOMY    . BREAST BIOPSY Right    CYST REMOVED-16-43YRS AGO-NEG  . CARDIAC CATHETERIZATION  1995   [facility]  . CARDIAC CATHETERIZATION  2001   [facility]  . CARDIAC CATHETERIZATION  2006   [facility]  . CARDIAC CATHETERIZATION     [facility] with Dr. Nehemiah Massed   . COLON RESECTION    . FOOT SURGERY Left 01/10/2016   03/06/16 second foot surgery  . KNEE SURGERY    . SHOULDER SURGERY      Allergies  Allergies  Allergen Reactions  . Chlorpromazine Anaphylaxis and Other (See Comments)    Other Reaction: LOC Other Reaction: LOC Other Reaction: LOC Other Reaction:  LOC   . Metoclopramide Other (See Comments) and Anaphylaxis    Other Reaction: RESTLESS Twitching , cant sit still  Other Reaction: RESTLESS Other Reaction: RESTLESS Other Reaction: RESTLESS   . Sulfamethizole Nausea And Vomiting and Other (See Comments)  . Other Other (See Comments)    Torazine gave her syncope Upset stomach Upset stomach   . Celecoxib Nausea Only and Anxiety  . Meloxicam Other (See Comments) and Diarrhea    History of Present Illness    68 year old female with a history of hypertension, hyperlipidemia, chronic intermittent chest and epigastric pain, 5 myalgia, depression, DVT, GERD, hiatal hernia, and degenerative joint disease pending right total hip arthroplasty.  She has a nearly 30-year history of intermittent chest and epigastric pain that comes on suddenly, almost exclusively with rest, lasts up to 30 minutes, and resolves either spontaneously or after using she reports having had multiple cardiac catheterizations as well as stress test, all of which were reportedly normal.  She had a negative/low risk Myoview in October 2017bentyl.  An echocardiogram in the summer 2018 which showed normal LV function.  I saw her in clinic in April for preoperative evaluation and she was significantly hypertensive at 180/100.  I titrated her losartan and added carvedilol.  Follow-up in office blood pressure check showed significant improvement.  She is now tentatively scheduled for surgery  in late June.  Activity remains limited in the setting of hip pain.  She has chronic, stable dyspnea on exertion but has not had any chest pain.  She denies PND, orthopnea, dizziness, syncope, or early satiety.  She had noted some lower extremity swelling which occurred later in the day and seem to be dependent in nature.  This is mostly improved.  Home Medications    Prior to Admission medications   Medication Sig Start Date End Date Taking? Authorizing Provider  acetaminophen (TYLENOL) 325 MG  tablet Take 650 mg by mouth every 6 (six) hours as needed.   Yes [provider]  aspirin EC 81 MG tablet Take 1 tablet (81 mg total) by mouth daily. 04/28/16  Yes End, Harrell Gave, MD  carvedilol (COREG) 3.125 MG tablet Take 1 tablet (3.125 mg total) by mouth 2 (two) times daily with a meal. 10/18/17  Yes Theora Gianotti, NP  dicyclomine (BENTYL) 20 MG tablet Take 1 tablet (20 mg total) by mouth 3 (three) times daily as needed for spasms. 04/20/16 12/01/17 Yes Nance Pear, MD  FLUoxetine (PROZAC) 20 MG capsule Take 20 mg by mouth daily.  08/22/13  Yes [provider]  fluticasone furoate-vilanterol (BREO ELLIPTA) 100-25 MCG/INH AEPB Inhale 1 puff into the lungs as needed.    Yes [provider]  ipratropium-albuterol (DUONEB) 0.5-2.5 (3) MG/3ML SOLN Inhale into the lungs as needed. 11/10/16 12/01/17 Yes [provider]  losartan (COZAAR) 100 MG tablet Take 1 tablet (100 mg total) by mouth daily. 10/18/17 01/16/18 Yes Theora Gianotti, NP  oxyCODONE-acetaminophen (PERCOCET) 7.5-325 MG tablet Take 1 tablet by mouth as needed.    Yes [provider]  pantoprazole (PROTONIX) 40 MG tablet Take 40 mg by mouth 2 (two) times daily.  08/15/13  Yes [provider]  pravastatin (PRAVACHOL) 40 MG tablet Take 40 mg by mouth daily.  09/02/16  Yes [provider]  traZODone (DESYREL) 50 MG tablet Take by mouth as needed. 09/02/16  Yes [provider]    Review of Systems    Mild, dependent lower extremity edema and chronic, stable dyspnea on exertion.  She denies chest pain, palpitations, PND, orthopnea, dizziness, syncope, or early satiety..  All other systems reviewed and are otherwise negative except as noted above.  Physical Exam    VS:  BP 120/80 (BP Location: Left Arm, Patient Position: Sitting, Cuff Size: Large)   Pulse 82   Ht 5\' 3"  (1.6 m)   Wt 234 lb 8 oz (106.4 kg)   LMP  (LMP Unknown)   BMI 41.54 kg/m  , BMI  Body mass index is 41.54 kg/m. GEN: Well nourished, well developed, in no acute distress.  HEENT: normal.  Neck: Supple, obese, difficult to gauge JVP, no carotid bruits, or masses. Cardiac: RRR, no murmurs, rubs, or gallops. No clubbing, cyanosis, trace bilateral ankle edema.  Radials/DP/PT 2+ and equal bilaterally.  Respiratory:  Respirations regular and unlabored, clear to auscultation bilaterally. GI: Soft, nontender, nondistended, BS + x 4. MS: no deformity or atrophy. Skin: warm and dry, no rash. Neuro:  Strength and sensation are intact. Psych: Normal affect.  Accessory Clinical Findings    None  Assessment & Plan    1.  Essential hypertension: Much improved with titration of losartan and addition of metoprolol.  She has also been more careful with salt at home.  She is actively trying to lose weight by watching her caloric intake.  She is not able to be particularly  active.  2.  Preoperative cardiovascular evaluation: She is pending right total hip arthroplasty in late June.  She had low risk Myoview in October 2017 and normal LV function by echo in August 2018.  Both of these were performed in the setting of chronic dyspnea on exertion and atypical chest pain.  Chronic dyspnea has been stable and she has not been having any chest pain.  She may proceed with surgery without additional ischemic evaluation.  She should continue beta-blocker and statin therapy throughout the perioperative period.  3. Hyperlipidemia: Recent lipids showed a total cholesterol of 253, triglycerides 114, HDL of 40, and LDL of 190.  She had previously been on pravastatin therapy but says she ran out of it and simply never had it refilled.  It is still on her medicine list however.  With an LDL of 190 and a 10-year risk of a cardiovascular event of 15%, I have asked to resume pravastatin.  Recent LFTs performed by primary care showed normal AST and ALT with mild elevation of alkaline phosphatase.  She is due for  follow-up lab work in that regard.  4.  Chronic intermittent epigastric pain with hiatal hernia: Stable symptoms.  She uses bentyl prn.  5.  Disposition: Follow-up in clinic in 3 months or sooner if necessary.   Murray Hodgkins, NP 12/01/2017, 11:16 AM

## 2017-12-09 ENCOUNTER — Telehealth: Payer: Self-pay | Admitting: Internal Medicine

## 2017-12-09 DIAGNOSIS — I10 Essential (primary) hypertension: Secondary | ICD-10-CM

## 2017-12-09 NOTE — Telephone Encounter (Signed)
Pt c/o swelling: STAT is pt has developed SOB within 24 hours  1) How much weight have you gained and in what time span? Unknown this week no scale   2) If swelling, where is the swelling located? BLE up to knees legs are tight and hurting shoes are tight   3) Are you currently taking a fluid pill? No   4) Are you currently SOB?  Not unless exerted   5) Do you have a log of your daily weights (if so, list)? No   6) Have you gained 3 pounds in a day or 5 pounds in a week? Not sure   7) Have you traveled recently? Yes

## 2017-12-09 NOTE — Telephone Encounter (Signed)
Call cell patient still driving

## 2017-12-09 NOTE — Telephone Encounter (Signed)
Called patient.  States her lower legs and feet started swelling on Monday. Usually they go down during the night but have not since Monday. States "feels like going to pop."  Denies shortness of breath or chest pain.  Per patient, swelling has not been like this in years. She used to take furosemide or HCTZ years ago. She was here seeing Ignacia Bayley, NP recently and the swelling was not as bad at that appointment.  She feels like she needs something to help with this fluid. Routing to Dr End for advice.

## 2017-12-09 NOTE — Telephone Encounter (Signed)
Patient took bp after getting home   149/99   HR 85  Now   151/87 HR 88

## 2017-12-09 NOTE — Telephone Encounter (Signed)
I have not seen the patient for over a year.  As she was just seen by Ignacia Bayley, NP, a week ago, I will forward this to him for advice.  Nelva Bush, MD [facility] Pager: [phone]

## 2017-12-10 MED ORDER — HYDROCHLOROTHIAZIDE 25 MG PO TABS: 25.0000 mg | ORAL_TABLET | Freq: Every day | ORAL | 3 refills | Status: DC

## 2017-12-10 NOTE — Telephone Encounter (Signed)
Spoke with patient and reviewed recommendations by provider. She reports that after traveling and then last night her legs were so tight they were shiny. She reports someone gave her a lasix 20 mg and she really put out a ton of fluid. She verbalized that she is aware that is not recommended to take other medications but she states that she was desperate. Reviewed instructions to start HCTZ 25 mg daily and then have [facility]. Discussed importance of restricting salt and diet modifications. Also recommended that she try wearing compression hose/socks and elevate legs when sitting. She verbalized understanding of our conversation, agreement with plan, importance of not taking medications from others, and had no further questions at this time.

## 2017-12-10 NOTE — Telephone Encounter (Signed)
With swelling and elevated bp, reasonable to resume hctz 25 mg daily. K+ has historically run high, so will not add supplemental KCl @ this time.  F/u bmet in a week.  Please reiterate need to be careful with salt intake/processed foods.

## 2018-01-04 DIAGNOSIS — Z96649 Presence of unspecified artificial hip joint: Secondary | ICD-10-CM | POA: Insufficient documentation

## 2018-02-24 DIAGNOSIS — Z79899 Other long term (current) drug therapy: Secondary | ICD-10-CM | POA: Insufficient documentation

## 2018-03-30 ENCOUNTER — Ambulatory Visit: Payer: Medicare Other | Admitting: Internal Medicine

## 2018-04-28 ENCOUNTER — Ambulatory Visit: Payer: Medicare Other | Admitting: Physician Assistant

## 2018-04-29 ENCOUNTER — Ambulatory Visit (INDEPENDENT_AMBULATORY_CARE_PROVIDER_SITE_OTHER): Payer: Medicare Other | Admitting: Internal Medicine

## 2018-04-29 ENCOUNTER — Encounter: Payer: Self-pay | Admitting: Internal Medicine

## 2018-04-29 VITALS — BP 132/80 | HR 79 | Ht 63.0 in | Wt 234.0 lb

## 2018-04-29 DIAGNOSIS — R0789 Other chest pain: Secondary | ICD-10-CM

## 2018-04-29 DIAGNOSIS — E7800 Pure hypercholesterolemia, unspecified: Secondary | ICD-10-CM | POA: Diagnosis not present

## 2018-04-29 DIAGNOSIS — I10 Essential (primary) hypertension: Secondary | ICD-10-CM

## 2018-04-29 DIAGNOSIS — R600 Localized edema: Secondary | ICD-10-CM

## 2018-04-29 MED ORDER — HYDROCHLOROTHIAZIDE 25 MG PO TABS: 25.0000 mg | ORAL_TABLET | Freq: Every day | ORAL | 2 refills | Status: DC

## 2018-04-29 MED ORDER — CARVEDILOL 3.125 MG PO TABS: 3.1250 mg | ORAL_TABLET | Freq: Two times a day (BID) | ORAL | 3 refills | Status: DC

## 2018-04-29 MED ORDER — LOSARTAN POTASSIUM 100 MG PO TABS: 100.0000 mg | ORAL_TABLET | Freq: Every day | ORAL | 3 refills | Status: DC

## 2018-04-29 NOTE — Progress Notes (Signed)
Follow-up Outpatient Visit Date: 04/29/2018  Primary Care Provider: Gayland Curry, MD [place] [address]  Chief Complaint: Follow-up chest pain and leg edema  HPI:  Misty Hebert is a 67 y.o. year-old female with history of chronic intermittent chest pain, hypertension, hyperlipidemia, DVT, GERD, and hiatal hernia, who presents for follow-up of chest pain and hypertension.  She was last seen in our office in 11/2017 by Ignacia Bayley, NP, in anticipation of hip surgery.  She was doing well from a heart standpoint.  Her pressure was also well controlled.  She contacted our office in early June due to leg swelling after a trip.  She was advised to resume HCTZ 25 mg daily.  Today, Misty Hebert reports that she is doing well.  She notes one additional episode of leg edema this summer that occurred after a trip.  She attributes her leg swelling to sitting for extended periods of time.  She does not wear compression stockings on a regular basis.  She has been using HCTZ only on an as-needed basis for leg swelling.  She notes 2 episodes of chest pain over the last 2 weeks.  Both occurred at rest lasting up to about 30 minutes.  She describes the pain as a "spasm" in the center of her chest without radiation or associated symptoms.  She does not have any exertional chest pain.  Chest pain is similar to what she has experienced over the last 30 years and for which she has undergone multiple catheterizations and stress tests.  She is trying to walk, though she still notes some discomfort from her hip replacement this summer.  She denies orthopnea, PND, palpitations, and lightheadedness.  --------------------------------------------------------------------------------------------------  Cardiovascular History & Procedures: Cardiovascular Problems:  Atypical chest pain  Risk Factors:  Hypertension, hyperlipidemia, family history, and obesity  Cath/PCI:  None  CV  Surgery:  None  EP Procedures and Devices:  None  Non-Invasive Evaluation(s):  Myocardial perfusion stress test (05/05/16): Low risk study with no significant ischemia. Moderate in size, mild in severity, apical and anterior defect at rest is consistent with shifting breast attenuation. LVEF normal (55-65%).  Exercise stress echo (01/01/14, [facility]): Normal  Recent CV Pertinent Labs: Lab Results  Component Value Date   INR 1.1 07/14/2012   K 4.8 11/09/2017   K 4.0 10/26/2014   BUN 10 11/09/2017   BUN 13 10/26/2014   CREATININE 0.80 11/09/2017   CREATININE 1.02 (H) 10/26/2014    Past medical and surgical history were reviewed and updated in EPIC.  Current Meds  Medication Sig  . acetaminophen (TYLENOL) 325 MG tablet Take 650 mg by mouth every 6 (six) hours as needed.  Marland Kitchen aspirin EC 81 MG tablet Take 1 tablet (81 mg total) by mouth daily.  . carvedilol (COREG) 3.125 MG tablet Take 1 tablet (3.125 mg total) by mouth 2 (two) times daily with a meal.  . fluticasone furoate-vilanterol (BREO ELLIPTA) 100-25 MCG/INH AEPB Inhale 1 puff into the lungs as needed.   . hydrochlorothiazide (HYDRODIURIL) 25 MG tablet Take 1 tablet (25 mg total) by mouth daily.  Marland Kitchen losartan (COZAAR) 100 MG tablet Take 1 tablet (100 mg total) by mouth daily.  . pravastatin (PRAVACHOL) 40 MG tablet pravastatin 40 mg tablet  . traMADol (ULTRAM) 50 MG tablet tramadol 50 mg tablet  1-2 tabs daily prn pain, fill 04/22/18  . traZODone (DESYREL) 50 MG tablet Take by mouth as needed.  . [DISCONTINUED] carvedilol (COREG) 3.125 MG tablet Take  1 tablet (3.125 mg total) by mouth 2 (two) times daily with a meal.  . [DISCONTINUED] hydrochlorothiazide (HYDRODIURIL) 25 MG tablet Take 25 mg by mouth daily as needed.  . [DISCONTINUED] losartan (COZAAR) 100 MG tablet Take 1 tablet (100 mg total) by mouth daily.  . [DISCONTINUED] pantoprazole (PROTONIX) 40 MG tablet Take 40 mg by mouth 2 (two) times daily.      Allergies: Chlorpromazine; Metoclopramide; Sulfamethizole; Other; Celecoxib; and Meloxicam  Social History   Tobacco Use  . Smoking status: Never Smoker  . Smokeless tobacco: Never Used  Substance Use Topics  . Alcohol use: No  . Drug use: No    Family History  Problem Relation Age of Onset  . Breast cancer Maternal Aunt 39  . Heart attack Mother   . Heart disease Mother        pacemaker/ICD  . Heart attack Father   . Stroke Father   . Lung cancer Father   . Heart disease Brother        stents in early 50's    Review of Systems: A 12-system review of systems was performed and was negative except as noted in the HPI.  --------------------------------------------------------------------------------------------------  Physical Exam: BP 132/80 (BP Location: Left Arm, Patient Position: Sitting, Cuff Size: Normal)   Pulse 79   Ht 5\' 3"  (1.6 m)   Wt 234 lb (106.1 kg)   LMP  (LMP Unknown)   BMI 41.45 kg/m   General: Morbidly obese woman, seated comfortably in the exam room. HEENT: No conjunctival pallor or scleral icterus. Moist mucous membranes.  OP clear. Neck: Supple without lymphadenopathy, thyromegaly, JVD, or HJR. Lungs: Normal work of breathing. Clear to auscultation bilaterally without wheezes or crackles. Heart: Regular rate and rhythm without murmurs, rubs, or gallops.  Unable to assess PMI due to body habitus. Abd: Bowel sounds present. Soft, NT/ND.  Unable to assess HSM due to body habitus. Ext: Trace pretibial edema bilaterally.   Skin: Warm and dry without rash.  EKG: Normal sinus rhythm without abnormalities.  Lab Results  Component Value Date   WBC 8.0 04/20/2016   HGB 14.6 04/20/2016   HCT 42.2 04/20/2016   MCV 84.3 04/20/2016   PLT 234 04/20/2016    Lab Results  Component Value Date   NA 141 11/09/2017   K 4.8 11/09/2017   CL 104 11/09/2017   CO2 22 11/09/2017   BUN 10 11/09/2017   CREATININE 0.80 11/09/2017   GLUCOSE 128 (H)  11/09/2017   ALT 25 10/26/2014    No results found for: CHOL, HDL, LDLCALC, LDLDIRECT, TRIG, CHOLHDL  --------------------------------------------------------------------------------------------------  ASSESSMENT AND PLAN: Chronic atypical chest pain Symptoms stable, having been present for 3 decades.  Given multiple negative ischemia evaluations in the past (most recently 2017), we will defer further work-up.  Lower extremity edema This sounds dependent and may be due to a combination of venous insufficiency and some element of diastolic dysfunction.  Given that blood pressure is borderline elevated today, I have advised Misty Hebert to take HCTZ 25 mg on a daily basis.  Hypertension Blood pressure borderline elevated today.  I have recommended that Misty Hebert take HCTZ 25 mg daily (rather than as needed).  I will have her return in about a month for a basic metabolic panel.  Hyperlipidemia Patient is currently taking pravastatin due to issues with atorvastatin in the past.  We will check a fasting lipid panel and ALT when she returns for labs in a month.  Follow-up: Return to  clinic in 6 months.  Nelva Bush, MD 04/29/2018 1:07 PM

## 2018-04-29 NOTE — Addendum Note (Signed)
Addended by: Alba Destine on: 04/29/2018 04:45 PM   Modules accepted: Orders

## 2018-04-29 NOTE — Patient Instructions (Signed)
Medication Instructions:  Your physician has recommended you make the following change in your medication:  1- TAKE Hydrochlorothiazide (HCTZ) 25 mg (1 tablet) by mouth once a day.  If you need a refill on your cardiac medications before your next appointment, please call your pharmacy.   Lab work: Your physician recommends that you return for lab work in: 1 MONTH (November 25TH) FOR LAB WORK.  LIPID, ALT, BMET. - You will need to be FASTING. - Please go to the [facility]. You will check in at the front desk to the right as you walk into the atrium. Valet Parking is offered if needed.   If you have labs (blood work) drawn today and your tests are completely normal, you will receive your results only by: Marland Kitchen MyChart Message (if you have MyChart) OR . A paper copy in the mail If you have any lab test that is abnormal or we need to change your treatment, we will call you to review the results.  Testing/Procedures: none  Follow-Up: At [facility], you and your health needs are our priority.  As part of our continuing mission to provide you with exceptional heart care, we have created designated Provider Care Teams.  These Care Teams include your primary Cardiologist (physician) and Advanced Practice Providers (APPs -  Physician Assistants and Nurse Practitioners) who all work together to provide you with the care you need, when you need it. You will need a follow up appointment in 6 months.  Please call our office 2 months in advance to schedule this appointment.  You may see Nelva Bush, MD or one of the following Advanced Practice Providers on your designated Care Team:   Murray Hodgkins, NP Christell Faith, PA-C . Marrianne Mood, PA-C

## 2018-06-07 DIAGNOSIS — D72829 Elevated white blood cell count, unspecified: Secondary | ICD-10-CM | POA: Insufficient documentation

## 2018-06-07 DIAGNOSIS — M545 Low back pain, unspecified: Secondary | ICD-10-CM | POA: Insufficient documentation

## 2018-06-07 DIAGNOSIS — M1610 Unilateral primary osteoarthritis, unspecified hip: Secondary | ICD-10-CM | POA: Insufficient documentation

## 2018-06-07 DIAGNOSIS — M1611 Unilateral primary osteoarthritis, right hip: Secondary | ICD-10-CM | POA: Insufficient documentation

## 2018-08-08 DIAGNOSIS — H02831 Dermatochalasis of right upper eyelid: Secondary | ICD-10-CM | POA: Insufficient documentation

## 2018-08-08 DIAGNOSIS — H02403 Unspecified ptosis of bilateral eyelids: Secondary | ICD-10-CM | POA: Insufficient documentation

## 2018-08-08 DIAGNOSIS — H02834 Dermatochalasis of left upper eyelid: Secondary | ICD-10-CM | POA: Insufficient documentation

## 2018-08-08 DIAGNOSIS — H5340 Unspecified visual field defects: Secondary | ICD-10-CM | POA: Insufficient documentation

## 2019-05-02 ENCOUNTER — Ambulatory Visit (INDEPENDENT_AMBULATORY_CARE_PROVIDER_SITE_OTHER): Payer: Medicare Other | Admitting: Podiatry

## 2019-05-02 ENCOUNTER — Ambulatory Visit (INDEPENDENT_AMBULATORY_CARE_PROVIDER_SITE_OTHER): Payer: Medicare Other

## 2019-05-02 ENCOUNTER — Other Ambulatory Visit: Payer: Self-pay

## 2019-05-02 ENCOUNTER — Encounter: Payer: Self-pay | Admitting: Podiatry

## 2019-05-02 DIAGNOSIS — M7662 Achilles tendinitis, left leg: Secondary | ICD-10-CM

## 2019-05-02 DIAGNOSIS — M722 Plantar fascial fibromatosis: Secondary | ICD-10-CM

## 2019-05-02 MED ORDER — METHYLPREDNISOLONE 4 MG PO TBPK: ORAL_TABLET | ORAL | 0 refills | Status: DC

## 2019-05-03 NOTE — Progress Notes (Signed)
She presents today after having not seen her for about 2-1/2 years with a chief complaint of plantar heel pain left and posterior heel pain left states that is been aching for the past few months no injury that she can recall.  She states that she really does not have a lot of morning pain but just feels constant particularly in the posterior after sitting for a while and getting back up to walk.  She tried stretching and tramadol nothing really seems to help.  ROS: Denies fever chills nausea vomiting muscle aches pains calf pain back pain chest pain shortness of breath.  Objective: Vital signs are stable alert and oriented x3.  Pulses are strong and palpable.  Neurologic sensorium is intact.  Deep tendon reflexes are intact.  Muscle strength is normal symmetrical.  Orthopedic evaluation demonstrates all joints distal to the ankle level full range of motion without crepitus she does have a swollen tight heel left.  Posterior tenderness along the Achilles at its insertion site with a small nodular mass that appears to be fluctuant.  Radiographs taken today demonstrate soft tissue increase in density with some calcification plantar fascial calcaneal insertion site and posterior calcaneus at the Achilles insertion site.  Small spur with soft tissue increase in density of the tendon and the soft tissue possibly consistent with bursitis.  Assessment: Achilles bursitis Achilles tendinitis left heel.  Plantar fasciitis left heel.  Plan: Discussed etiology pathology conservative versus surgical therapies.  At this point I injected her plantar fascial area with 20 mg of Kenalog 5 mg of Marcaine also injected the posterior aspect of the calcaneus at the area of the bursa with 5 mg of lidocaine and 2 mg of dexamethasone.  Start her on methylprednisolone and she is will start wearing her boot that she has at home.  I will follow-up with her in about a month

## 2019-05-04 ENCOUNTER — Ambulatory Visit: Payer: Medicare Other | Admitting: Podiatry

## 2019-06-07 ENCOUNTER — Ambulatory Visit: Payer: Medicare Other | Admitting: Podiatry

## 2019-06-12 ENCOUNTER — Ambulatory Visit: Payer: Medicare Other | Admitting: Podiatry

## 2019-06-27 ENCOUNTER — Ambulatory Visit: Payer: Medicare Other | Admitting: Podiatry

## 2019-07-03 ENCOUNTER — Other Ambulatory Visit: Payer: Self-pay

## 2019-07-03 ENCOUNTER — Ambulatory Visit (INDEPENDENT_AMBULATORY_CARE_PROVIDER_SITE_OTHER): Payer: Medicare Other | Admitting: Podiatry

## 2019-07-03 ENCOUNTER — Encounter

## 2019-07-03 DIAGNOSIS — M7662 Achilles tendinitis, left leg: Secondary | ICD-10-CM | POA: Diagnosis not present

## 2019-07-03 NOTE — Progress Notes (Signed)
She presents today chief complaint of the Achilles left.  She states that all in all is doing very well much better than it was previously but is still sore sore and runs up the back of the leg.  Objective: Vital signs are stable alert and oriented x3.  There is no erythema edema cellulitis drainage odor.  She has tenderness on palpation and fluctuance on palpation of the bursitis of the posterior superior aspect of the calcaneus.  Assessment: Bursitis Achilles tendinitis left.  Plan: I went ahead and injected it today with 2 mg of dexamethasone local anesthetic just into the subcutaneous tissue and into the bursa.  Making sure not to inject into the tendon itself.  I will follow-up with her in a few weeks if not improved then we will consider an MRI.

## 2019-07-20 ENCOUNTER — Ambulatory Visit: Payer: Medicare Other | Admitting: Podiatry

## 2019-08-14 ENCOUNTER — Other Ambulatory Visit: Payer: Self-pay

## 2019-08-14 DIAGNOSIS — Z1231 Encounter for screening mammogram for malignant neoplasm of breast: Secondary | ICD-10-CM

## 2019-08-16 ENCOUNTER — Ambulatory Visit: Payer: Medicare Other | Admitting: Podiatry

## 2019-08-22 ENCOUNTER — Encounter: Payer: Self-pay | Admitting: Podiatry

## 2019-08-22 ENCOUNTER — Other Ambulatory Visit: Payer: Self-pay

## 2019-08-22 ENCOUNTER — Ambulatory Visit (INDEPENDENT_AMBULATORY_CARE_PROVIDER_SITE_OTHER): Payer: Medicare Other | Admitting: Podiatry

## 2019-08-22 DIAGNOSIS — M7662 Achilles tendinitis, left leg: Secondary | ICD-10-CM

## 2019-08-22 NOTE — Progress Notes (Signed)
She presents today for follow-up of her Achilles tendinitis states is doing all right right now it is hurts every now and again.  Objective: Vital signs are stable she is alert and oriented x3.  Pulses are palpable there is no erythema edema cellulitis drainage or odor she has no pain on palpation of the Achilles there is no warm spots on the Achilles.  Assessment: This is the best I have seen her in the past year or so.  Plan: Discussed etiology pathology conservative therapies recommended that when she has a flareup that she use Voltaren gel and follow-up with me if that does not reduce her symptoms.

## 2019-09-01 ENCOUNTER — Telehealth: Payer: Self-pay | Admitting: Internal Medicine

## 2019-09-01 NOTE — Telephone Encounter (Signed)
Scheduled

## 2019-09-04 ENCOUNTER — Encounter: Payer: Self-pay | Admitting: Internal Medicine

## 2019-09-04 ENCOUNTER — Other Ambulatory Visit: Payer: Self-pay

## 2019-09-04 ENCOUNTER — Ambulatory Visit (INDEPENDENT_AMBULATORY_CARE_PROVIDER_SITE_OTHER): Payer: Medicare Other | Admitting: Internal Medicine

## 2019-09-04 VITALS — BP 164/86 | HR 67 | Ht 63.0 in | Wt 235.0 lb

## 2019-09-04 DIAGNOSIS — I10 Essential (primary) hypertension: Secondary | ICD-10-CM | POA: Diagnosis not present

## 2019-09-04 DIAGNOSIS — R0789 Other chest pain: Secondary | ICD-10-CM

## 2019-09-04 MED ORDER — HYDROCHLOROTHIAZIDE 25 MG PO TABS: 25.0000 mg | ORAL_TABLET | Freq: Every day | ORAL | 2 refills | Status: DC

## 2019-09-04 NOTE — Progress Notes (Signed)
Follow-up Outpatient Visit Date: 09/04/2019  Primary Care Provider: Gayland Curry, MD [place] [address]  Chief Complaint: Follow-up chest pain  HPI:  Misty Hebert is a 69 y.o. female with history of chronic intermittent chest pain, hypertension, hyperlipidemia, DVT, GERD, and hiatal hernia, who presents for follow-up of chest pain and hypertension.  I last saw her in 04/2018, which time she was doing well other than a single episode of dependent leg edema while traveling.  She was using HCTZ only as needed.  She also reported to episodes of atypical chest pain, similar to what she had been experiencing off and on for the last 30 years.  I recommended that Misty Hebert take HCTZ 25 mg on a daily basis rather than as needed to help control her leg swelling and blood pressure.  Today, Misty Hebert reports that she is doing well.  She continues to have sporadic stabbing central chest pain that is severe but lasts only a few seconds at a time.  The pain comes on randomly and is not exertional.  There are no associated symptoms.  She reports one episode of a different, less severe chest pain that lasted about 10-15 minutes.  It too occurred at rest and was without associated symptoms.  She denies shortness of breath, palpitations, and lightheadedness.  She continues to use HCTZ only as needed for intermittent dependent leg edema.  --------------------------------------------------------------------------------------------------  Cardiovascular History & Procedures: Cardiovascular Problems:  Atypical chest pain  Risk Factors:  Hypertension, hyperlipidemia, family history, and obesity  Cath/PCI:  None  CV Surgery:  None  EP Procedures and Devices:  None  Non-Invasive Evaluation(s):  Myocardial perfusion stress test (05/05/16): Low risk study with no significant ischemia. Moderate in size, mild in severity, apical and anterior defect at rest is consistent  with shifting breast attenuation. LVEF normal (55-65%).  Exercise stress echo (01/01/14, [facility]): Normal  Recent CV Pertinent Labs: Lab Results  Component Value Date   INR 1.1 07/14/2012   K 4.8 11/09/2017   K 4.0 10/26/2014   BUN 10 11/09/2017   BUN 13 10/26/2014   CREATININE 0.80 11/09/2017   CREATININE 1.02 (H) 10/26/2014    Past medical and surgical history were reviewed and updated in EPIC.  Current Meds  Medication Sig  . aspirin EC 81 MG tablet Take 81 mg by mouth daily.  . carvedilol (COREG) 3.125 MG tablet Take 3.125 mg by mouth 2 (two) times daily with a meal.  . diazepam (VALIUM) 5 MG tablet as needed.   . hydrochlorothiazide (HYDRODIURIL) 25 MG tablet Take 25 mg by mouth daily.  Marland Kitchen losartan (COZAAR) 50 MG tablet Take 50 mg by mouth daily.  . traMADol (ULTRAM) 50 MG tablet Take 50 mg by mouth daily.     Allergies: Chlorpromazine, Metoclopramide, Sulfamethizole, Other, Celecoxib, and Meloxicam  Social History   Tobacco Use  . Smoking status: Never Smoker  . Smokeless tobacco: Never Used  Substance Use Topics  . Alcohol use: No  . Drug use: No    Family History  Problem Relation Age of Onset  . Breast cancer Maternal Aunt 110  . Heart attack Mother   . Heart disease Mother        pacemaker/ICD  . Heart attack Father   . Stroke Father   . Lung cancer Father   . Heart disease Brother        stents in early 50's    Review of Systems: A 12-system review of  systems was performed and was negative except as noted in the HPI.  --------------------------------------------------------------------------------------------------  Physical Exam: BP (!) 164/86 (BP Location: Left Arm, Patient Position: Sitting, Cuff Size: Large)   Pulse 67   Ht 5\' 3"  (1.6 m)   Wt 235 lb (106.6 kg)   LMP  (LMP Unknown)   SpO2 98%   BMI 41.63 kg/m   General:  NAD. HEENT: No conjunctival pallor or scleral icterus. Facemask in place. Neck: No JVD or HJR. Lungs: Normal  work of breathing. Clear to auscultation bilaterally without wheezes or crackles. Heart: Regular rate and rhythm without murmurs, rubs, or gallops. Abd: Bowel sounds present. Obese, soft, NT/ND. Ext: No lower extremity edema. 2+ radial and pedal pulses bilaterally.  EKG:  NSR with low voltage.  Otherwise, no significant abnormality.  Lab Results  Component Value Date   WBC 8.0 04/20/2016   HGB 14.6 04/20/2016   HCT 42.2 04/20/2016   MCV 84.3 04/20/2016   PLT 234 04/20/2016    Lab Results  Component Value Date   NA 141 11/09/2017   K 4.8 11/09/2017   CL 104 11/09/2017   CO2 22 11/09/2017   BUN 10 11/09/2017   CREATININE 0.80 11/09/2017   GLUCOSE 128 (H) 11/09/2017   ALT 25 10/26/2014   --------------------------------------------------------------------------------------------------  ASSESSMENT AND PLAN: Chest pain: Pain remains atypical and overall is similar to what she has been experiencing for many years.  Prior ischemia evaluations have been unrevealing (most recently in 2017).  We have agreed to defer additional workup at this time, though Misty Hebert will contact us if the frequency, severity, or quality of the chest pain changes so that we can discuss repeat ischemia evaluation.  We will continue with risk factor modification and medical therapy at this time.  Hypertension: Blood pressure suboptimally controlled, though it should be noted that Misty Hebert only takes HCTZ as needed for leg edema.  I encouraged her to take this daily, as prescribed, in addition to standing doses of carvedilol and losartan.  Morbid obesity: Weight loss encouraged through diet and exercise.  Follow-up: Return to clinic in 3 months.  Nelva Bush, MD 09/04/2019 12:02 PM

## 2019-09-04 NOTE — Patient Instructions (Signed)
Call if you have recurrent/worsening chest pain.  Medication Instructions:  Your physician recommends that you continue on your current medications as directed. Please refer to the Current Medication list given to you today. Please take your Hydrochlorothiazide 25 mg (1 tablet) by mouth once a day.  *If you need a refill on your cardiac medications before your next appointment, please call your pharmacy*  Lab Work: none If you have labs (blood work) drawn today and your tests are completely normal, you will receive your results only by: Marland Kitchen MyChart Message (if you have MyChart) OR . A paper copy in the mail If you have any lab test that is abnormal or we need to change your treatment, we will call you to review the results.  Testing/Procedures: none  Follow-Up: At [facility], you and your health needs are our priority.  As part of our continuing mission to provide you with exceptional heart care, we have created designated Provider Care Teams.  These Care Teams include your primary Cardiologist (physician) and Advanced Practice Providers (APPs -  Physician Assistants and Nurse Practitioners) who all work together to provide you with the care you need, when you need it.  We recommend signing up for the patient portal called "MyChart".  Sign up information is provided on this After Visit Summary.  MyChart is used to connect with patients for Virtual Visits (Telemedicine).  Patients are able to view lab/test results, encounter notes, upcoming appointments, etc.  Non-urgent messages can be sent to your provider as well.   To learn more about what you can do with MyChart, go to [email or web address].    Your next appointment:   3 month(s)  The format for your next appointment:   In Person  Provider:    You may see Nelva Bush, MD or one of the following Advanced Practice Providers on your designated Care Team:    Murray Hodgkins, NP  Christell Faith, PA-C  Marrianne Mood,  PA-C

## 2019-09-05 ENCOUNTER — Encounter: Payer: Self-pay | Admitting: Internal Medicine

## 2019-09-21 ENCOUNTER — Encounter: Payer: Self-pay | Admitting: Podiatry

## 2019-09-21 ENCOUNTER — Other Ambulatory Visit: Payer: Self-pay

## 2019-09-21 ENCOUNTER — Ambulatory Visit (INDEPENDENT_AMBULATORY_CARE_PROVIDER_SITE_OTHER): Payer: Medicare Other | Admitting: Podiatry

## 2019-09-21 DIAGNOSIS — M722 Plantar fascial fibromatosis: Secondary | ICD-10-CM | POA: Diagnosis not present

## 2019-09-21 DIAGNOSIS — M7662 Achilles tendinitis, left leg: Secondary | ICD-10-CM

## 2019-09-21 NOTE — Progress Notes (Signed)
She presents today with a chief complaint of pain to the plantar medial arch and the posterior Achilles area.  Objective: Vital signs are stable alert and oriented x3.  She has pain on palpation medial calcaneal tubercle she also has pain on palpation of the Achilles just on the superior calcaneal area.  Assessment: Plan fasciitis compensatory Achilles tendinitis left.  Plan: Sterile Betadine skin prep I injected dexamethasone local anesthetic to the Achilles area and making sure not to inject into the tendon but only subcutaneously.  Also injected into the plantar fascial calcaneal insertion site area with Kenalog and local anesthetic a total of 10 mg was utilized there.  We will follow-up with her on an as-needed basis.

## 2019-10-06 ENCOUNTER — Other Ambulatory Visit: Payer: Self-pay | Admitting: Orthopedic Surgery

## 2019-10-06 DIAGNOSIS — M545 Low back pain, unspecified: Secondary | ICD-10-CM

## 2019-10-24 ENCOUNTER — Ambulatory Visit: Admission: RE | Admit: 2019-10-24 | Payer: Medicare Other | Source: Ambulatory Visit

## 2019-12-06 ENCOUNTER — Other Ambulatory Visit: Payer: Self-pay

## 2019-12-06 ENCOUNTER — Ambulatory Visit (INDEPENDENT_AMBULATORY_CARE_PROVIDER_SITE_OTHER): Payer: Medicare Other | Admitting: Internal Medicine

## 2019-12-06 ENCOUNTER — Encounter: Payer: Self-pay | Admitting: Internal Medicine

## 2019-12-06 VITALS — BP 122/70 | HR 84 | Ht 63.0 in | Wt 237.0 lb

## 2019-12-06 DIAGNOSIS — I10 Essential (primary) hypertension: Secondary | ICD-10-CM | POA: Diagnosis not present

## 2019-12-06 DIAGNOSIS — I5032 Chronic diastolic (congestive) heart failure: Secondary | ICD-10-CM | POA: Diagnosis not present

## 2019-12-06 DIAGNOSIS — R072 Precordial pain: Secondary | ICD-10-CM | POA: Diagnosis not present

## 2019-12-06 MED ORDER — METOPROLOL TARTRATE 100 MG PO TABS: 100.0000 mg | ORAL_TABLET | Freq: Once | ORAL | 0 refills | Status: DC

## 2019-12-06 NOTE — Progress Notes (Signed)
Follow-up Outpatient Visit Date: 12/06/2019  Primary Care Provider: Gayland Curry, MD [place] [address]  Chief Complaint: Follow-up chest pain  HPI:  Misty Hebert is a 69 y.o. female with history of chronic intermittent chest pain, hypertension, hyperlipidemia, DVT, GERD, and hiatal hernia, who presents for follow-up of hypertension and chest pain.  I last saw her in early March, at which time she was doing well other than continued sporadic central chest pain lasting only a few seconds at a time.  She made note of a single less severe episode of chest pain that persisted for 10 to 15 minutes while at rest.  Due to suboptimal blood pressure control, I encouraged [name] to use HCTZ on a daily basis rather than as needed for leg edema.  Current doses of carvedilol and losartan were continued.  We agreed to defer additional ischemia testing.  Today, [name] reports feeling about the same as at our last visit.  She still has episodes of sharp, stabbing chest pain lasting up to 5 to 10 minutes at a time.  It is not exertional or related to specific activities.  She notes some improvement in the past with pantoprazole use.  She stopped this medication out of concern for developing kidney issues.  She has stable exertional dyspnea as well as intermittent dependent edema.  She feels some achiness with rosuvastatin.  --------------------------------------------------------------------------------------------------  Cardiovascular History & Procedures: Cardiovascular Problems:  Atypical chest pain  Risk Factors:  Hypertension, hyperlipidemia, family history, and obesity  Cath/PCI:  None  CV Surgery:  None  EP Procedures and Devices:  None  Non-Invasive Evaluation(s):  Myocardial perfusion stress test (05/05/16): Low risk study with no significant ischemia. Moderate in size, mild in severity, apical and anterior defect at rest is consistent with  shifting breast attenuation. LVEF normal (55-65%).  Exercise stress echo (01/01/14, [facility]): Normal  Recent CV Pertinent Labs: Lab Results  Component Value Date   INR 1.1 07/14/2012   K 4.8 11/09/2017   K 4.0 10/26/2014   BUN 10 11/09/2017   BUN 13 10/26/2014   CREATININE 0.80 11/09/2017   CREATININE 1.02 (H) 10/26/2014    Past medical and surgical history were reviewed and updated in EPIC.  Current Meds  Medication Sig  . aspirin EC 81 MG tablet Take 81 mg by mouth daily.  . carvedilol (COREG) 3.125 MG tablet Take 3.125 mg by mouth 2 (two) times daily with a meal.  . hydrochlorothiazide (HYDRODIURIL) 25 MG tablet Take 1 tablet (25 mg total) by mouth daily.  Marland Kitchen losartan (COZAAR) 50 MG tablet Take 50 mg by mouth daily.  . traMADol (ULTRAM) 50 MG tablet Take 50 mg by mouth daily.     Allergies: Chlorpromazine, Metoclopramide, Sulfamethizole, Other, Celecoxib, and Meloxicam  Social History   Tobacco Use  . Smoking status: Never Smoker  . Smokeless tobacco: Never Used  Substance Use Topics  . Alcohol use: No  . Drug use: No    Family History  Problem Relation Age of Onset  . Breast cancer Maternal Aunt 26  . Heart attack Mother   . Heart disease Mother        pacemaker/ICD  . Heart attack Father   . Stroke Father   . Lung cancer Father   . Heart disease Brother        stents in early 50's    Review of Systems: A 12-system review of systems was performed and was negative except as noted  in the HPI.  --------------------------------------------------------------------------------------------------  Physical Exam: BP 122/70 (BP Location: Left Arm, Patient Position: Sitting, Cuff Size: Normal)   Pulse 84   Ht 5\' 3"  (1.6 m)   Wt 237 lb (107.5 kg)   LMP  (LMP Unknown)   SpO2 97%   BMI 41.98 kg/m   General: NAD. Neck: No JVD or HJR. Lungs: Clear to auscultation without wheezes or crackles. Heart: Regular rate and rhythm without murmurs, rubs, or  gallops. Abdomen: Soft, nontender, nondistended. Extremities: No lower extremity edema.  EKG: Normal sinus rhythm with borderline LVH.  Otherwise, no significant abnormality.  Lab Results  Component Value Date   WBC 8.0 04/20/2016   HGB 14.6 04/20/2016   HCT 42.2 04/20/2016   MCV 84.3 04/20/2016   PLT 234 04/20/2016    Lab Results  Component Value Date   NA 141 11/09/2017   K 4.8 11/09/2017   CL 104 11/09/2017   CO2 22 11/09/2017   BUN 10 11/09/2017   CREATININE 0.80 11/09/2017   GLUCOSE 128 (H) 11/09/2017   ALT 25 10/26/2014    No results found for: CHOL, HDL, LDLCALC, LDLDIRECT, TRIG, CHOLHDL  --------------------------------------------------------------------------------------------------  ASSESSMENT AND PLAN: Atypical chest pain: This is been a longstanding issue for [name] with multiple prior ischemia evaluations being unrevealing.  She has several risk factors for coronary artery disease, with last ischemia evaluation in 2017 showing a subtle anterior defect on myocardial perfusion imaging felt to be related to artifact.  Given that she has continued to be symptomatic, I recommended that we perform a cardiac CTA to ensure that she does not have significant coronary artery disease.  I have also suggested that she begin taking an over-the-counter PPI, given that her symptoms have improved in the past with pantoprazole use.  Chronic HFpEF: [name] appears euvolemic today.  She notes stable exertional dyspnea and intermittent dependent leg edema.  She can continue using HCTZ 25 mg daily for blood pressure control and treatment of her edema.  Hypertension: Blood pressure well controlled today.  Continue current doses of carvedilol, HCTZ, and losartan.  Morbid obesity: BMI greater than 40.  Continue to work on weight loss through diet and exercise.  Follow-up: Return to clinic in 6 weeks.  Nelva Bush, MD 12/06/2019 11:31 AM

## 2019-12-06 NOTE — Patient Instructions (Addendum)
Medication Instructions:  Your physician recommends that you continue on your current medications as directed. Please refer to the Current Medication list given to you today.  *If you need a refill on your cardiac medications before your next appointment, please call your pharmacy*   Lab Work: Your physician recommends that you return for lab work in: with in the week prior to the [address] for BMET.  - Please go to the [facility]. You will check in at the front desk to the right as you walk into the atrium. Valet Parking is offered if needed. - No appointment needed. You may go any day between 7 am and 6 pm.  If you have labs (blood work) drawn today and your tests are completely normal, you will receive your results only by: Marland Kitchen MyChart Message (if you have MyChart) OR . A paper copy in the mail If you have any lab test that is abnormal or we need to change your treatment, we will call you to review the results.   Testing/Procedures:  Your cardiac CT will be scheduled at one of the below locations:   [facility] [address] [place], [place] [address] [phone]  [place] [address] [place], [place] [address] [phone]  If scheduled at [facility], please arrive at the [facility] main entrance of [facility] 30 minutes prior to test start time. Proceed to the [facility] Radiology Department (first floor) to check-in and test prep.  If scheduled at [facility], please arrive 15 mins early for check-in and test prep.  Please follow these instructions carefully (unless otherwise directed):   On the Night Before the Test: . Be sure to Drink plenty of water. . Do not consume any caffeinated/decaffeinated beverages or chocolate 12 hours prior to your test. . Do not take any antihistamines 12 hours prior to your test. . If you take Metformin do not  take 24 hours prior to test.   On the Day of the Test: . Drink plenty of water. Do not drink any water within one hour of the test. . Do not eat any food 4 hours prior to the test. . You may take your regular medications prior to the test.  . Take metoprolol (Lopressor) two hours prior to test.  -  Lopressor 163m x1. .Marland KitchenHOLD Furosemide/Hydrochlorothiazide morning of the test. . FEMALES- please wear underwire-free bra if available       After the Test: . Drink plenty of water. . After receiving IV contrast, you may experience a mild flushed feeling. This is normal. . On occasion, you may experience a mild rash up to 24 hours after the test. This is not dangerous. If this occurs, you can take Benadryl 25 mg and increase your fluid intake. . If you experience trouble breathing, this can be serious. If it is severe call 911 IMMEDIATELY. If it is mild, please call our office. . If you take any of these medications: Glipizide/Metformin, Avandament, Glucavance, please do not take 48 hours after completing test unless otherwise instructed.   Once we have confirmed authorization from your insurance company, we will call you to set up a date and time for your test.   For non-scheduling related questions, please contact the cardiac imaging nurse navigator should you have any questions/concerns: SMarchia Bond Cardiac Imaging Nurse Navigator MBurley Saver Interim Cardiac Imaging Nurse Navigator [facility]  Direct Office Dial: [phone]   For scheduling needs, including cancellations and rescheduling, please call [phone].     Follow-Up: At [facility], you and your health needs are our priority.  As part of our continuing mission to provide you with exceptional heart care, we have created designated Provider Care Teams.  These Care Teams include your primary Cardiologist (physician) and Advanced Practice Providers (APPs -  Physician Assistants and Nurse  Practitioners) who all work together to provide you with the care you need, when you need it.  We recommend signing up for the patient portal called "MyChart".  Sign up information is provided on this After Visit Summary.  MyChart is used to connect with patients for Virtual Visits (Telemedicine).  Patients are able to view lab/test results, encounter notes, upcoming appointments, etc.  Non-urgent messages can be sent to your provider as well.   To learn more about what you can do with MyChart, go to [email or web address].    Your next appointment:   6 week(s)  The format for your next appointment:   In Person  Provider:    You may see Nelva Bush, MD or one of the following Advanced Practice Providers on your designated Care Team:    Murray Hodgkins, NP  Christell Faith, PA-C  Marrianne Mood, PA-C    Cardiac CT Angiogram A cardiac CT angiogram is a procedure to look at the heart and the area around the heart. It may be done to help find the cause of chest pains or other symptoms of heart disease. During this procedure, a substance called contrast dye is injected into the blood vessels in the area to be checked. A large X-ray machine, called a CT scanner, then takes detailed pictures of the heart and the surrounding area. The procedure is also sometimes called a coronary CT angiogram, coronary artery scanning, or CTA. A cardiac CT angiogram allows the health care provider to see how well blood is flowing to and from the heart. The health care provider will be able to see if there are any problems, such as:  Blockage or narrowing of the coronary arteries in the heart.  Fluid around the heart.  Signs of weakness or disease in the muscles, valves, and tissues of the heart. Tell a health care provider about:  Any allergies you have. This is especially important if you have had a previous allergic reaction to contrast dye.  All medicines you are taking, including vitamins, herbs,  eye drops, creams, and over-the-counter medicines.  Any blood disorders you have.  Any surgeries you have had.  Any medical conditions you have.  Whether you are pregnant or may be pregnant.  Any anxiety disorders, chronic pain, or other conditions you have that may increase your stress or prevent you from lying still. What are the risks? Generally, this is a safe procedure. However, problems may occur, including:  Bleeding.  Infection.  Allergic reactions to medicines or dyes.  Damage to other structures or organs.  Kidney damage from the contrast dye that is used.  Increased risk of cancer from radiation exposure. This risk is low. Talk with your health care provider about: ? The risks and benefits of testing. ? How you can receive the lowest dose of radiation. What happens before the procedure?  Wear comfortable clothing and remove any jewelry, glasses, dentures, and hearing aids.  Follow instructions from your health care provider about eating and drinking. This may include: ? For 12 hours before the procedure -- avoid caffeine.  This includes tea, coffee, soda, energy drinks, and diet pills. Drink plenty of water or other fluids that do not have caffeine in them. Being well hydrated can prevent complications. ? For 4-6 hours before the procedure -- stop eating and drinking. The contrast dye can cause nausea, but this is less likely if your stomach is empty.  Ask your health care provider about changing or stopping your regular medicines. This is especially important if you are taking diabetes medicines, blood thinners, or medicines to treat problems with erections (erectile dysfunction). What happens during the procedure?   Hair on your chest may need to be removed so that small sticky patches called electrodes can be placed on your chest. These will transmit information that helps to monitor your heart during the procedure.  An IV will be inserted into one of your  veins.  You might be given a medicine to control your heart rate during the procedure. This will help to ensure that good images are obtained.  You will be asked to lie on an exam table. This table will slide in and out of the CT machine during the procedure.  Contrast dye will be injected into the IV. You might feel warm, or you may get a metallic taste in your mouth.  You will be given a medicine called nitroglycerin. This will relax or dilate the arteries in your heart.  The table that you are lying on will move into the CT machine tunnel for the scan.  The person running the machine will give you instructions while the scans are being done. You may be asked to: ? Keep your arms above your head. ? Hold your breath. ? Stay very still, even if the table is moving.  When the scanning is complete, you will be moved out of the machine.  The IV will be removed. The procedure may vary among health care providers and hospitals. What can I expect after the procedure? After your procedure, it is common to have:  A metallic taste in your mouth from the contrast dye.  A feeling of warmth.  A headache from the nitroglycerin. Follow these instructions at home:  Take over-the-counter and prescription medicines only as told by your health care provider.  If you are told, drink enough fluid to keep your urine pale yellow. This will help to flush the contrast dye out of your body.  Most people can return to their normal activities right after the procedure. Ask your health care provider what activities are safe for you.  It is up to you to get the results of your procedure. Ask your health care provider, or the department that is doing the procedure, when your results will be ready.  Keep all follow-up visits as told by your health care provider. This is important. Contact a health care provider if:  You have any symptoms of allergy to the contrast dye. These include: ? Shortness of  breath. ? Rash or hives. ? A racing heartbeat. Summary  A cardiac CT angiogram is a procedure to look at the heart and the area around the heart. It may be done to help find the cause of chest pains or other symptoms of heart disease.  During this procedure, a large X-ray machine, called a CT scanner, takes detailed pictures of the heart and the surrounding area after a contrast dye has been injected into blood vessels in the area.  Ask your health care provider about changing or stopping your regular medicines before  the procedure. This is especially important if you are taking diabetes medicines, blood thinners, or medicines to treat erectile dysfunction.  If you are told, drink enough fluid to keep your urine pale yellow. This will help to flush the contrast dye out of your body. This information is not intended to replace advice given to you by your health care provider. Make sure you discuss any questions you have with your health care provider. Document Revised: 02/15/2019 Document Reviewed: 02/15/2019 Elsevier Patient Education  [place].

## 2019-12-07 ENCOUNTER — Encounter: Payer: Self-pay | Admitting: Internal Medicine

## 2019-12-22 ENCOUNTER — Other Ambulatory Visit: Payer: Self-pay | Admitting: *Deleted

## 2019-12-22 DIAGNOSIS — Z01810 Encounter for preprocedural cardiovascular examination: Secondary | ICD-10-CM

## 2019-12-22 DIAGNOSIS — R0789 Other chest pain: Secondary | ICD-10-CM

## 2019-12-27 ENCOUNTER — Other Ambulatory Visit: Payer: Self-pay

## 2019-12-27 ENCOUNTER — Other Ambulatory Visit
Admission: RE | Admit: 2019-12-27 | Discharge: 2019-12-27 | Disposition: A | Discharge status: Home / Self Care | Payer: Medicare Other | Attending: Internal Medicine | Admitting: Internal Medicine

## 2019-12-27 ENCOUNTER — Telehealth (HOSPITAL_COMMUNITY): Payer: Self-pay | Admitting: *Deleted

## 2019-12-27 ENCOUNTER — Encounter: Payer: Self-pay | Admitting: Podiatry

## 2019-12-27 ENCOUNTER — Ambulatory Visit (INDEPENDENT_AMBULATORY_CARE_PROVIDER_SITE_OTHER): Payer: Medicare Other | Admitting: Podiatry

## 2019-12-27 DIAGNOSIS — M722 Plantar fascial fibromatosis: Secondary | ICD-10-CM

## 2019-12-27 DIAGNOSIS — R0789 Other chest pain: Secondary | ICD-10-CM

## 2019-12-27 DIAGNOSIS — S93692A Other sprain of left foot, initial encounter: Secondary | ICD-10-CM

## 2019-12-27 DIAGNOSIS — Z01810 Encounter for preprocedural cardiovascular examination: Secondary | ICD-10-CM | POA: Insufficient documentation

## 2019-12-27 LAB — BASIC METABOLIC PANEL
Anion gap: 9 (ref 5–15)
BUN: 12 mg/dL (ref 8–23)
CO2: 27 mmol/L (ref 22–32)
Calcium: 9.4 mg/dL (ref 8.9–10.3)
Chloride: 104 mmol/L (ref 98–111)
Creatinine, Ser: 0.88 mg/dL (ref 0.44–1.00)
GFR calc Af Amer: >60 mL/min (ref >60)
GFR calc non Af Amer: >60 mL/min (ref >60)
Glucose, Bld: 127 mg/dL — ABNORMAL HIGH (ref 70–99)
Potassium: 4.3 mmol/L (ref 3.5–5.1)
Sodium: 140 mmol/L (ref 135–145)

## 2019-12-27 NOTE — Telephone Encounter (Signed)

## 2019-12-27 NOTE — Progress Notes (Signed)
She presents today for continued pain to her left foot.  States that the fasciitis is causing mild Achilles tendinitis tacked up.  States that has been going on for a long time and like to see about getting it corrected.  Objective: Vital signs are stable alert oriented x3.  There is no erythema edema cellulitis drainage or odor she has pain to palpation Mcalhany tubercle of her left heel and tenderness on palpation of the Achilles.  Assessment: Pain in limb secondary to plantar fasciitis and Achilles tendinitis left.  Plan: Cannot rule out a tear of the plantar fascia and an insertional fraying of the Achilles.  Plan: I injected the plantar fascia today with 20 mg Kenalog 5 mg Marcaine point maximal tenderness.  I am also going to go ahead and have her started presurgery a MRI for the rear foot.  I will follow up with her once that MRI comes in.

## 2019-12-28 ENCOUNTER — Ambulatory Visit
Admission: RE | Admit: 2019-12-28 | Discharge: 2019-12-28 | Disposition: A | Discharge status: Home / Self Care | Payer: Medicare Other | Source: Ambulatory Visit | Attending: Internal Medicine | Admitting: Internal Medicine

## 2019-12-28 ENCOUNTER — Other Ambulatory Visit: Payer: Self-pay

## 2019-12-28 ENCOUNTER — Telehealth: Payer: Self-pay

## 2019-12-28 DIAGNOSIS — R072 Precordial pain: Secondary | ICD-10-CM | POA: Diagnosis present

## 2019-12-28 DIAGNOSIS — I5032 Chronic diastolic (congestive) heart failure: Secondary | ICD-10-CM

## 2019-12-28 DIAGNOSIS — S93692A Other sprain of left foot, initial encounter: Secondary | ICD-10-CM

## 2019-12-28 MED ORDER — NITROGLYCERIN 0.4 MG SL SUBL
0.8000 mg | SUBLINGUAL_TABLET | Freq: Once | SUBLINGUAL | Status: AC
  Administered 2019-12-28: 0.8 mg via SUBLINGUAL

## 2019-12-28 MED ORDER — IOHEXOL 350 MG/ML SOLN
115.0000 mL | Freq: Once | INTRAVENOUS | Status: AC | PRN
  Administered 2019-12-28: 115 mL via INTRAVENOUS

## 2019-12-28 MED ORDER — METOPROLOL TARTRATE 5 MG/5ML IV SOLN
5.0000 mg | Freq: Once | INTRAVENOUS | Status: AC
  Administered 2019-12-28: 5 mg via INTRAVENOUS

## 2019-12-28 NOTE — Telephone Encounter (Signed)
Per Medicare guidelines, no precert required for MRI Patient notified via voice mail to contact scheduling department and set up appt to her convenience

## 2019-12-28 NOTE — Telephone Encounter (Signed)
-----   Message from Rip Harbour, [facility] sent at 12/28/2019 11:46 AM EDT ----- Regarding: MRI MRI ankle left - evaluate plantar fascial tear left - surgical consideration

## 2019-12-28 NOTE — Progress Notes (Signed)
Patient tolerated procedure well. Ambulate to chair w/o difficulty. Sitting in chair talking with staff and drinking water, declined crackers. No distress noted, denies pain, all questions answered. No needs.

## 2019-12-29 ENCOUNTER — Telehealth: Payer: Self-pay

## 2019-12-29 NOTE — Telephone Encounter (Signed)
Called to give the patient Cardiac CT results. [email or web address]. 

## 2019-12-29 NOTE — Telephone Encounter (Signed)
-----   Message from Nelva Bush, MD sent at 12/28/2019 10:21 PM EDT ----- Please let Misty Hebert know that her cardiac CTA shows mild plaque buildup in one of her coronary arteries as well as the aorta but no significant narrowing/blockage.  I do not believe that her chest pain is cardiac in nature.  She should continue her current medications and follow-up as previously directed.

## 2020-01-02 NOTE — Telephone Encounter (Signed)
Attempted to call the patient. I left a detailed message of results/ MD recommendations on her home voice mail (ok per DPR). I asked that she call back with any further questions/ concerns.

## 2020-01-04 ENCOUNTER — Other Ambulatory Visit: Payer: Self-pay

## 2020-01-04 ENCOUNTER — Ambulatory Visit
Admission: RE | Admit: 2020-01-04 | Discharge: 2020-01-04 | Disposition: A | Discharge status: Home / Self Care | Payer: Medicare Other | Source: Ambulatory Visit

## 2020-01-04 DIAGNOSIS — Z1231 Encounter for screening mammogram for malignant neoplasm of breast: Secondary | ICD-10-CM | POA: Diagnosis present

## 2020-01-06 ENCOUNTER — Other Ambulatory Visit: Payer: Self-pay

## 2020-01-06 ENCOUNTER — Ambulatory Visit
Admission: RE | Admit: 2020-01-06 | Discharge: 2020-01-06 | Disposition: A | Discharge status: Home / Self Care | Payer: Medicare Other | Source: Ambulatory Visit | Attending: Podiatry | Admitting: Podiatry

## 2020-01-06 DIAGNOSIS — S93692A Other sprain of left foot, initial encounter: Secondary | ICD-10-CM | POA: Diagnosis present

## 2020-01-07 ENCOUNTER — Ambulatory Visit: Admission: RE | Admit: 2020-01-07 | Payer: Medicare Other | Source: Ambulatory Visit

## 2020-01-07 ENCOUNTER — Other Ambulatory Visit: Payer: Medicare Other

## 2020-01-16 NOTE — Progress Notes (Signed)
Follow-up Outpatient Visit Date: 01/17/2020  Primary Care Provider: Gayland Curry, MD [place] [address]  Chief Complaint: Chest pain  HPI:  Misty Hebert is a 69 y.o. female with history of chronic intermittent chest pain, hypertension, hyperlipidemia, DVT, GERD, and hiatal hernia, who presents for follow-up of chest pain and hypertension.  I last saw her in early June, at which time she continued to complain of episodic sharp, stabbing chest pain lasting 5 to 10 minutes at a time.  Her discomfort had improved a little bit with pantoprazole use, though she ultimately stopped taking this out of concern for kidney issues.  Subsequent cardiac CTA was reassuring with mild, nonobstructive disease (less than 25%).  Today, Misty Hebert reports that she is doing fairly well.  She notes 1 episode of sudden onset substernal chest pain that occurred around 3 weeks ago (this is around the time of her cardiac CTA, though she does not recall exactly when her pain occurred relative to the scan).  She was seated at home and suddenly had a very sharp sensation under her sternum.  It lasted for about 20 minutes and then shot across the left side of her chest.  She subsequently had a sore feeling across her chest for about 2 days.  When the pain first began, it felt as though she may be a little breathless.  She otherwise did not have any associated symptoms.  The pain has not recurred.  Misty Hebert notes occasional dependent edema in her legs, which has been chronic.  She was concerned that her aforementioned chest pain could represent a pulmonary embolism, as she has a history of DVT.  She also notes that her daughter died from a PE.  --------------------------------------------------------------------------------------------------  Cardiovascular History & Procedures: Cardiovascular Problems:  Atypical chest pain  Risk Factors:  Hypertension, hyperlipidemia, family history, and  obesity  Cath/PCI:  None  CV Surgery:  None  EP Procedures and Devices:  None  Non-Invasive Evaluation(s):  Cardiac CTA (12/28/2019): LMCA normal.  LAD with less than 25% noncalcified stenosis in the proximal vessel.  LCx without significant disease.  Dominant RCA without significant disease, though misregistration in the mid RCA limited evaluation over a short segment.  Aortic atherosclerosis noted.  Myocardial perfusion stress test (05/05/16): Low risk study with no significant ischemia. Moderate in size, mild in severity, apical and anterior defect at rest is consistent with shifting breast attenuation. LVEF normal (55-65%).  Exercise stress echo (01/01/14, [facility]): Normal  Recent CV Pertinent Labs: Lab Results  Component Value Date   INR 1.1 07/14/2012   K 4.3 12/27/2019   K 4.0 10/26/2014   BUN 12 12/27/2019   BUN 10 11/09/2017   BUN 13 10/26/2014   CREATININE 0.88 12/27/2019   CREATININE 1.02 (H) 10/26/2014    Past medical and surgical history were reviewed and updated in EPIC.  Current Meds  Medication Sig  . aspirin EC 81 MG tablet Take 81 mg by mouth daily.  . carvedilol (COREG) 3.125 MG tablet Take 3.125 mg by mouth 2 (two) times daily with a meal.  . FLUoxetine (PROZAC) 10 MG capsule Take by mouth.  . hydrochlorothiazide (HYDRODIURIL) 25 MG tablet Take 1 tablet (25 mg total) by mouth daily.  Marland Kitchen losartan (COZAAR) 50 MG tablet Take 50 mg by mouth daily.  . rosuvastatin (CRESTOR) 5 MG tablet Take 5 mg by mouth daily.  . traMADol (ULTRAM) 50 MG tablet Take 50 mg by mouth daily.  Allergies: Chlorpromazine, Metoclopramide, Sulfamethizole, Other, Celecoxib, and Meloxicam  Social History   Tobacco Use  . Smoking status: Never Smoker  . Smokeless tobacco: Never Used  Vaping Use  . Vaping Use: Never used  Substance Use Topics  . Alcohol use: No  . Drug use: No    Family History  Problem Relation Age of Onset  . Breast cancer Maternal Aunt  16  . Heart attack Mother   . Heart disease Mother        pacemaker/ICD  . Heart attack Father   . Stroke Father   . Lung cancer Father   . Heart disease Brother        stents in early 50's    Review of Systems: A 12-system review of systems was performed and was negative except as noted in the HPI.  --------------------------------------------------------------------------------------------------  Physical Exam: BP (!) 168/100 (BP Location: Left Arm, Patient Position: Sitting, Cuff Size: Large)   Pulse 78   Ht 5\' 3"  (1.6 m)   Wt 235 lb 6 oz (106.8 kg)   LMP  (LMP Unknown)   SpO2 97%   BMI 41.69 kg/m  Repeat BP: 148/86  General: NAD. Lungs: Clear to auscultation without wheezes or crackles. Heart: Regular rate and rhythm without murmurs. Abdomen: Soft, nontender, nondistended. Extremities: No lower extremity edema.  EKG: Normal sinus rhythm with borderline LVH.  Otherwise, no significant abnormality.  Unchanged from prior tracing on 12/06/2019.  Lab Results  Component Value Date   WBC 8.0 04/20/2016   HGB 14.6 04/20/2016   HCT 42.2 04/20/2016   MCV 84.3 04/20/2016   PLT 234 04/20/2016    Lab Results  Component Value Date   NA 140 12/27/2019   K 4.3 12/27/2019   CL 104 12/27/2019   CO2 27 12/27/2019   BUN 12 12/27/2019   CREATININE 0.88 12/27/2019   GLUCOSE 127 (H) 12/27/2019   ALT 25 10/26/2014    No results found for: CHOL, HDL, LDLCALC, LDLDIRECT, TRIG, CHOLHDL  --------------------------------------------------------------------------------------------------  ASSESSMENT AND PLAN: Atypical chest pain: Unfortunately, Misty Hebert continues to have episodic sharp substernal chest pain that is not exertional.  Her recent cardiac CTA was reassuring with only mild (less than 25%) disease involving the proximal LAD.  It is conceivable that her intermittent chest pain reflects coronary vasospasm.  As such, we have agreed to begin amlodipine 2.5 mg daily.  I have  also provided her with a prescription for sublingual nitroglycerin to be taken as needed.  If these interventions do not provide any relief, I suspect that Misty Hebert's chest pain is noncardiac in nature.  We will continue with medical therapy and lifestyle modifications to prevent progression of coronary disease.  Hypertension: Blood pressure moderately elevated on initial check, better but still a little high on recheck.  As above, we will add amlodipine 2.5 mg daily.  No other changes to current regimen of carvedilol, hydrochlorothiazide, and losartan.  Chronic HFpEF: Misty Hebert appears grossly euvolemic with stable symptoms.  We will try to improve blood pressure control, as outlined above.  Continue HCTZ for hypertension therapy and gentle diuresis.  Follow-up: Return to clinic in 3 months.  Nelva Bush, MD 01/17/2020 12:10 PM

## 2020-01-17 ENCOUNTER — Encounter: Payer: Self-pay | Admitting: Internal Medicine

## 2020-01-17 ENCOUNTER — Other Ambulatory Visit: Payer: Self-pay

## 2020-01-17 ENCOUNTER — Ambulatory Visit (INDEPENDENT_AMBULATORY_CARE_PROVIDER_SITE_OTHER): Payer: Medicare Other | Admitting: Internal Medicine

## 2020-01-17 VITALS — BP 168/100 | HR 78 | Ht 63.0 in | Wt 235.4 lb

## 2020-01-17 DIAGNOSIS — R0789 Other chest pain: Secondary | ICD-10-CM | POA: Diagnosis not present

## 2020-01-17 DIAGNOSIS — I5032 Chronic diastolic (congestive) heart failure: Secondary | ICD-10-CM

## 2020-01-17 DIAGNOSIS — I10 Essential (primary) hypertension: Secondary | ICD-10-CM

## 2020-01-17 MED ORDER — AMLODIPINE BESYLATE 2.5 MG PO TABS
2.5000 mg | ORAL_TABLET | Freq: Every day | ORAL | 1 refills | Status: DC
Start: 2020-01-17 — End: 2020-04-25

## 2020-01-17 MED ORDER — NITROGLYCERIN 0.4 MG SL SUBL
0.4000 mg | SUBLINGUAL_TABLET | SUBLINGUAL | 4 refills | Status: DC | PRN
Start: 2020-01-17 — End: 2023-04-19

## 2020-01-17 NOTE — Patient Instructions (Signed)
Medication Instructions:  Your physician has recommended you make the following change in your medication:  1- START Amlodipine 2.5 mg (1 tablet) by mouth once a day. 2- Nitroglycerin as needed for chest pain - Dissolve 1 tablet (0.4 mg) under your tongue every 5 minutes as needed for chest pain. Do not take more than 3 doses. If chest pain does not resolve, then call 911 or go to the Emergency Room.   *If you need a refill on your cardiac medications before your next appointment, please call your pharmacy*  Follow-Up: At [facility], you and your health needs are our priority.  As part of our continuing mission to provide you with exceptional heart care, we have created designated Provider Care Teams.  These Care Teams include your primary Cardiologist (physician) and Advanced Practice Providers (APPs -  Physician Assistants and Nurse Practitioners) who all work together to provide you with the care you need, when you need it.  We recommend signing up for the patient portal called "MyChart".  Sign up information is provided on this After Visit Summary.  MyChart is used to connect with patients for Virtual Visits (Telemedicine).  Patients are able to view lab/test results, encounter notes, upcoming appointments, etc.  Non-urgent messages can be sent to your provider as well.   To learn more about what you can do with MyChart, go to [email or web address].    Your next appointment:   3 month(s)  The format for your next appointment:   In Person  Provider:    You may see Nelva Bush, MD or one of the following Advanced Practice Providers on your designated Care Team:    Murray Hodgkins, NP  Christell Faith, PA-C  Marrianne Mood, PA-C

## 2020-01-18 ENCOUNTER — Telehealth: Payer: Self-pay | Admitting: Podiatry

## 2020-01-18 NOTE — Telephone Encounter (Signed)
Left voicemail for patient to call office and schedule appt for MRI Results and discuss surgery.

## 2020-01-19 ENCOUNTER — Encounter: Payer: Self-pay | Admitting: Podiatry

## 2020-01-19 ENCOUNTER — Ambulatory Visit (INDEPENDENT_AMBULATORY_CARE_PROVIDER_SITE_OTHER): Payer: Medicare Other | Admitting: Podiatry

## 2020-01-19 ENCOUNTER — Other Ambulatory Visit: Payer: Self-pay

## 2020-01-19 DIAGNOSIS — M722 Plantar fascial fibromatosis: Secondary | ICD-10-CM | POA: Diagnosis not present

## 2020-01-19 NOTE — Progress Notes (Signed)
She presents today with still having pain to the plantar aspect of her left foot at this point she would like to go over her MRI report.  States that seems to be getting worse rather than better.  Objective: Vital signs are stable she is alert oriented x3.  Pulses are palpable.  There is still pain on palpation medial calcaneal tubercle of the left heel.  MRI does indicate chronic intractable plantar fasciitis.  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  Plan: At this point we went over consent form today line by line number by number giving her ample time to ask questions she is off it regarding an endoscopic plantar fasciotomy of the left heel.  I answered those questions to the best of my ability in layman's terms.  We discussed the pros and cons of surgery as well as the possible postop complications which may include but are not limited to postop pain bleeding swelling infection recurrence need for further surgery overcorrection under correction loss of digit loss of limb loss of life.  She understands that she will be in a cam walker for a period of 2 weeks followed by a tennis shoe and cam walker at night for 1 month.  We will obtain cardiac medical clearance that she has had atypical chest pain and has recently undergone a chest CT.

## 2020-01-24 ENCOUNTER — Telehealth: Payer: Self-pay | Admitting: Internal Medicine

## 2020-01-24 ENCOUNTER — Encounter: Payer: Self-pay | Admitting: Podiatry

## 2020-01-24 NOTE — Progress Notes (Signed)
[place] at [facility]) Phone: [phone], left detailed message on Shelly Durant's to fax over surgical clearance, and to call back if anything else is needed.

## 2020-01-24 NOTE — Telephone Encounter (Signed)
   [facility] Pre-operative Risk Assessment    Request for surgical clearance:  1. What type of surgery is being performed? Left Foot Endoscopic Plantar Fasciotomy (EPF)  2. When is this surgery scheduled? 02/02/20  3. What type of clearance is required (medical clearance vs. Pharmacy clearance to hold med vs. Both)? Both  4. Are there any medications that need to be held prior to surgery and how long? [facility] is asking if there needs to be anything held   5. Practice name and name of physician performing surgery? Dr. Roselind Messier with [facility]  6. What is your office phone number? [phone]   7.   What is your office fax number? [phone]  8.   Anesthesia type (None, local, MAC, general)? General - block    Magdelene Ruark Lestine Mount 01/24/2020, 1:32 PM  _________________________________________________________________   (provider comments below)

## 2020-01-24 NOTE — Telephone Encounter (Signed)
   Primary Cardiologist: Nelva Bush, MD  Chart reviewed as part of pre-operative protocol coverage. Given past medical history and time since last visit, based on ACC/AHA guidelines, Misty Hebert would be at acceptable risk for the planned procedure without further cardiovascular testing.   She does not have any medications at require holding prior to her foot surgery.  I will route this recommendation to the requesting party via Epic fax function and remove from pre-op pool.  Please call with questions.  Jossie Ng. Dax Murguia NP-C    01/24/2020, 2:37 PM [place] [address] Office [phone] Fax [phone]

## 2020-01-31 ENCOUNTER — Other Ambulatory Visit: Payer: Self-pay | Admitting: Podiatry

## 2020-01-31 MED ORDER — OXYCODONE-ACETAMINOPHEN 10-325 MG PO TABS: 1.0000 | ORAL_TABLET | Freq: Three times a day (TID) | ORAL | 0 refills | Status: AC | PRN

## 2020-01-31 MED ORDER — CEPHALEXIN 500 MG PO CAPS: 500.0000 mg | ORAL_CAPSULE | Freq: Three times a day (TID) | ORAL | 0 refills | Status: DC

## 2020-01-31 MED ORDER — ONDANSETRON HCL 4 MG PO TABS: 4.0000 mg | ORAL_TABLET | Freq: Three times a day (TID) | ORAL | 0 refills | Status: DC | PRN

## 2020-02-02 DIAGNOSIS — M722 Plantar fascial fibromatosis: Secondary | ICD-10-CM

## 2020-02-07 ENCOUNTER — Ambulatory Visit (INDEPENDENT_AMBULATORY_CARE_PROVIDER_SITE_OTHER): Payer: Medicare Other | Admitting: Podiatry

## 2020-02-07 ENCOUNTER — Encounter: Payer: Self-pay | Admitting: Podiatry

## 2020-02-07 ENCOUNTER — Other Ambulatory Visit: Payer: Self-pay

## 2020-02-07 DIAGNOSIS — M722 Plantar fascial fibromatosis: Secondary | ICD-10-CM

## 2020-02-07 DIAGNOSIS — Z9889 Other specified postprocedural states: Secondary | ICD-10-CM

## 2020-02-07 NOTE — Progress Notes (Signed)
She presents today date of surgery 02/02/2020 status post EPF left.  States that she is doing great no problems whatsoever.  Objective: Dry sterile dressing intact was removed demonstrates sutures are intact margins well coapted there is no erythema edema cellulitis drainage or odor no ecchymosis.  Assessment: Well-healing surgical foot left.  Plan: I encouraged her to continue range of motion exercises when she is not in the boot but otherwise the boot should stay on at all times unless she is showering.  I will follow-up with her in 1 week for suture removal.

## 2020-02-14 ENCOUNTER — Ambulatory Visit (INDEPENDENT_AMBULATORY_CARE_PROVIDER_SITE_OTHER): Payer: Medicare Other | Admitting: Podiatry

## 2020-02-14 ENCOUNTER — Encounter: Payer: Self-pay | Admitting: Podiatry

## 2020-02-14 ENCOUNTER — Other Ambulatory Visit: Payer: Self-pay

## 2020-02-14 DIAGNOSIS — Z9889 Other specified postprocedural states: Secondary | ICD-10-CM

## 2020-02-14 DIAGNOSIS — M722 Plantar fascial fibromatosis: Secondary | ICD-10-CM

## 2020-02-14 NOTE — Progress Notes (Signed)
She presents today for follow-up of her EPF left.  States that it feels like it is a little tender.  Particularly on size is a little more sore than I thought it would be.  Objective: Vital signs are stable oriented times 3 sutures are intact once removed today demonstrates no erythema edema cellulitis drainage or odor.  Assessment: Well-healing surgical foot.  Plan: Allow her to get back into her tennis shoe as of Friday and then she will to be wearing her night splint at nighttime for a month.  I will follow-up with her in 2 to 3 weeks.

## 2020-02-28 ENCOUNTER — Other Ambulatory Visit: Payer: Self-pay

## 2020-02-28 ENCOUNTER — Ambulatory Visit (INDEPENDENT_AMBULATORY_CARE_PROVIDER_SITE_OTHER): Payer: Medicare Other | Admitting: Podiatry

## 2020-02-28 ENCOUNTER — Encounter: Payer: Self-pay | Admitting: Podiatry

## 2020-02-28 DIAGNOSIS — M722 Plantar fascial fibromatosis: Secondary | ICD-10-CM

## 2020-02-28 DIAGNOSIS — Z9889 Other specified postprocedural states: Secondary | ICD-10-CM
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
## 2020-03-13 ENCOUNTER — Other Ambulatory Visit: Payer: Self-pay

## 2020-03-13 ENCOUNTER — Ambulatory Visit (INDEPENDENT_AMBULATORY_CARE_PROVIDER_SITE_OTHER): Payer: Medicare Other

## 2020-03-13 ENCOUNTER — Ambulatory Visit (INDEPENDENT_AMBULATORY_CARE_PROVIDER_SITE_OTHER): Payer: Medicare Other | Admitting: Podiatry

## 2020-03-13 ENCOUNTER — Encounter: Payer: Self-pay | Admitting: Podiatry

## 2020-03-13 DIAGNOSIS — M778 Other enthesopathies, not elsewhere classified: Secondary | ICD-10-CM

## 2020-03-13 MED ORDER — METHYLPREDNISOLONE 4 MG PO TBPK
ORAL_TABLET | ORAL | 0 refills | Status: DC
Start: 2020-03-13 — End: 2020-04-25

## 2020-03-13 NOTE — Progress Notes (Signed)
She presents today date of surgery 02/02/2020 status post EPF she is about 6 weeks out now left foot.  States that she is doing very good as far as the heel goes however she has noticed swelling and pain in the forefoot.  She denies any trauma.  Objective: Vital signs are stable she is alert oriented x3 presents in a slight on slipper today.  She has no pain on palpation mid calcaneal tubercles of the left foot.  Pulses remain palpable.  She has mild edema overlying the forefoot left.  No pinpoint tenderness.  Radiographs do not demonstrate any cortical interruptions at this point.  Assessment: Cannot rule out a stress fracture to the forefoot left.  Resolving surgical pain left foot status post EPF.  Plan: Discussed etiology pathology and surgical therapies at this point time went ahead and recommend that she get back into her tennis shoes and I wrote her prescription for methylprednisolone.  I will follow-up with her in the near future.

## 2020-03-27 ENCOUNTER — Encounter: Payer: Medicare Other | Admitting: Podiatry

## 2020-04-01 ENCOUNTER — Encounter: Payer: Self-pay | Admitting: Podiatry

## 2020-04-01 ENCOUNTER — Other Ambulatory Visit: Payer: Self-pay

## 2020-04-01 ENCOUNTER — Ambulatory Visit (INDEPENDENT_AMBULATORY_CARE_PROVIDER_SITE_OTHER): Payer: Medicare Other | Admitting: Podiatry

## 2020-04-01 DIAGNOSIS — M7662 Achilles tendinitis, left leg: Secondary | ICD-10-CM | POA: Diagnosis not present

## 2020-04-01 NOTE — Addendum Note (Signed)
Addended by: Tyson Dense T on: 04/01/2020 12:01 PM   Modules accepted: Level of Service

## 2020-04-01 NOTE — Progress Notes (Signed)
She presents today states that last Thursday there was a large knot that popped up on the posterior medial aspect of her left heel.  She states that that bursitis flared up again but it has gone down considerably.  She states that it was hurting so bad it made the top of my foot hurt too.  Could not sleep that night.  Objective: Vital signs are stable she is alert oriented x3 the plan fasciitis surgery is doing very well she has no pain on palpation to that surgical spot.  She does have some tenderness on palpation posterior medial aspect of a fluctuant area of the calcaneus.  Assessment: Retrocalcaneal bursitis retro-Achilles bursitis posterior medial aspect left.  Plan: This point I injected dexamethasone and some Kenalog about 5 mg into the bursa.  Hopefully this will relieve the pain for her.

## 2020-04-10 ENCOUNTER — Encounter: Payer: Medicare Other | Admitting: Podiatry

## 2020-04-24 ENCOUNTER — Ambulatory Visit: Payer: Medicare Other | Admitting: Internal Medicine

## 2020-04-25 ENCOUNTER — Encounter: Payer: Self-pay | Admitting: Family

## 2020-04-25 ENCOUNTER — Ambulatory Visit (INDEPENDENT_AMBULATORY_CARE_PROVIDER_SITE_OTHER): Payer: Medicare Other | Admitting: Family

## 2020-04-25 ENCOUNTER — Other Ambulatory Visit: Payer: Self-pay

## 2020-04-25 VITALS — BP 130/88 | HR 67 | Ht 63.0 in | Wt 232.0 lb

## 2020-04-25 DIAGNOSIS — I10 Essential (primary) hypertension: Secondary | ICD-10-CM | POA: Diagnosis not present

## 2020-04-25 MED ORDER — CARVEDILOL 3.125 MG PO TABS: 3.1250 mg | ORAL_TABLET | Freq: Two times a day (BID) | ORAL | 2 refills | Status: DC

## 2020-04-25 MED ORDER — HYDROCHLOROTHIAZIDE 25 MG PO TABS: 25.0000 mg | ORAL_TABLET | Freq: Every day | ORAL | 2 refills | Status: DC

## 2020-04-25 MED ORDER — AMLODIPINE BESYLATE 2.5 MG PO TABS: 2.5000 mg | ORAL_TABLET | Freq: Every day | ORAL | 2 refills | Status: DC

## 2020-04-25 MED ORDER — LOSARTAN POTASSIUM 50 MG PO TABS: 50.0000 mg | ORAL_TABLET | Freq: Every day | ORAL | 2 refills | Status: DC

## 2020-04-25 NOTE — Progress Notes (Signed)
Office Visit    Patient Name: Misty Hebert Date of Encounter: 04/25/2020  Primary Care Provider:  Gayland Curry, MD Primary Cardiologist:  Nelva Bush, MD Electrophysiologist:  None   Chief Complaint    Misty Hebert is a 69 y.o. female with a hx of intermittent chest pain, HTN, HLD, DVT, GERD, hiatal hernia presents today for follow up after addition of Amlodipine.    Past Medical History    Past Medical History:  Diagnosis Date  . Arthritis of knee   . Benign essential HTN   . Chest pain    a. she reports 4 normal heart caths; b. 12/2013 St [facility]: nl Misty Bryant); c. 04/2016 MV: apical and ant defect @ rest consistent w/ breast attenuation. No ischemia. EF 55-65%.  . Depression   . Diverticulitis of intestine w/o perforation or abscess w/o bleeding   . DVT (deep venous thrombosis) ([place])   . Dysphagia   . Fibromyalgia   . High cholesterol   . History of colon resection   . History of echocardiogram    a. 01/2017 Echo ([facility]): nl LV syst fxn.  . Hyperlipidemia   . Medial meniscus tear   . Migraine   . Neuromuscular disorder ([place])   . Obesity   . Overactive bladder   . Reflux   . Subchondral insufficiency fracture of condyle of femur ([place])   . Tension headache    Past Surgical History:  Procedure Laterality Date  . ABDOMINAL HYSTERECTOMY    . BREAST BIOPSY Right    CYST REMOVED-16-47YRS AGO-NEG  . CARDIAC CATHETERIZATION  1995   [facility]  . CARDIAC CATHETERIZATION  2001   [facility]  . CARDIAC CATHETERIZATION  2006   [facility]  . CARDIAC CATHETERIZATION     [facility] with Dr. Nehemiah Massed   . COLON RESECTION    . FOOT SURGERY Left 01/10/2016   03/06/16 second foot surgery  . KNEE SURGERY    . SHOULDER SURGERY      Allergies  Allergies  Allergen Reactions  . Chlorpromazine Anaphylaxis and Other (See Comments)    Other Reaction: LOC Other Reaction: LOC Other Reaction: LOC Other Reaction: LOC   . Metoclopramide Other (See Comments) and  Anaphylaxis    Other Reaction: RESTLESS Twitching , cant sit still  Other Reaction: RESTLESS Other Reaction: RESTLESS Other Reaction: RESTLESS   . Sulfamethizole Nausea And Vomiting and Other (See Comments)  . Other Other (See Comments)    Torazine gave her syncope Upset stomach Upset stomach   . Celecoxib Nausea Only and Anxiety  . Meloxicam Other (See Comments) and Diarrhea    History of Present Illness    Misty Hebert is a 69 y.o. female with a hx of intermittent chest pain, HTN, HLD, DVT, GERD, hiatal hernia last seen 01/17/20 by Dr. Saunders Revel.  She has a history of episodic sharp, stabbing chest pain lasting 5 to 10 minutes at a time.  Her symptoms previously improved a little bit of pantoprazole but she stopped taking it ultimately out of concern for kidney issues.  Subsequent cardiac CTA June 2021 with mild nonobstructive disease (less than 25%) and calcium score of 1.  Seen in follow-up 01/17/2020.  She is very concerned that her atypical chest pain could be due to PE as her daughter passed away from a PE.  Reassurance provided.  She was started on amlodipine 2.5 mg daily for improved blood pressure control and to exclude coronary vasospasm is contributory to symptoms.  Presents today for follow-up.  Reports no recurrent chest pain, pressure, tightness.  Reports no shortness of breath at rest or dyspnea on exertion. She is working on losing weight. Has been reducing her portion of carbohydrates and avoiding fried foods.  Tells me she feels remarkably better even just a few pounds down.  Has not been checking her blood pressure at home but does occasionally notice a headache across the front part of her forehead.  Her lower extremity edema is improving.  EKGs/Labs/Other Studies Reviewed:   The following studies were reviewed today:  Cardiac CTA 12/28/19 IMPRESSION: 1. Minimal non-obstructive CAD, CADRADS = 1.   2. Coronary calcium score of 0. This was 0 percentile for age and sex  matched control.   3. Normal coronary origin with right dominance.   4. Aortic atherosclerosis.   EKG:  EKG is ordered today.  The ekg ordered today demonstrates NSR 67 bpmw with left axis deviation and no acute ST/T wave changes.   Recent Labs: 12/27/2019: BUN 12; Creatinine, Ser 0.88; Potassium 4.3; Sodium 140  Recent Lipid Panel No results found for: CHOL, TRIG, HDL, CHOLHDL, VLDL, LDLCALC, LDLDIRECT  Home Medications   Current Meds  Medication Sig  . aspirin EC 81 MG tablet Take 81 mg by mouth daily.  . carvedilol (COREG) 3.125 MG tablet Take 3.125 mg by mouth 2 (two) times daily with a meal.  . FLUoxetine (PROZAC) 10 MG capsule Take by mouth.  . hydrochlorothiazide (HYDRODIURIL) 25 MG tablet Take 1 tablet (25 mg total) by mouth daily.  Marland Kitchen losartan (COZAAR) 50 MG tablet Take 50 mg by mouth daily.  . methylPREDNISolone (MEDROL DOSEPAK) 4 MG TBPK tablet 6 day dose pack - take as directed  . nitroGLYCERIN (NITROSTAT) 0.4 MG SL tablet Place 1 tablet (0.4 mg total) under the tongue every 5 (five) minutes as needed for chest pain (maximum of 3 doses.).  Marland Kitchen rosuvastatin (CRESTOR) 5 MG tablet Take 5 mg by mouth daily.  . traMADol (ULTRAM) 50 MG tablet Take 50 mg by mouth 2 (two) times daily as needed.      Review of Systems   Review of Systems  Constitutional: Negative for chills, fever and malaise/fatigue.  Cardiovascular: Negative for chest pain, dyspnea on exertion, leg swelling, near-syncope, orthopnea, palpitations and syncope.  Respiratory: Negative for cough, shortness of breath and wheezing.   Gastrointestinal: Negative for nausea and vomiting.  Neurological: Negative for dizziness, light-headedness and weakness.   All other systems reviewed and are otherwise negative except as noted above.  Physical Exam    VS:  BP 130/88 (BP Location: Left Arm, Patient Position: Sitting, Cuff Size: Normal)   Pulse 67   Ht 5\' 3"  (1.6 m)   Wt 232 lb (105.2 kg)   LMP  (LMP Unknown)   BMI  41.10 kg/m  , BMI Body mass index is 41.1 kg/m.  Wt Readings from Last 3 Encounters:  04/25/20 232 lb (105.2 kg)  01/17/20 235 lb 6 oz (106.8 kg)  12/06/19 237 lb (107.5 kg)     GEN: Well nourished, overweight, well developed, in no acute distress. HEENT: normal. Neck: Supple, no JVD, carotid bruits, or masses. Cardiac: RRR, no murmurs, rubs, or gallops. No clubbing, cyanosis, edema.  Radials/DP/PT 2+ and equal bilaterally.  Respiratory:  Respirations regular and unlabored, clear to auscultation bilaterally. GI: Soft, nontender, nondistended. MS: No deformity or atrophy. Skin: Warm and dry, no rash. Neuro:  Strength and sensation are intact. Psych: Normal affect.  Assessment & Plan  1. Atypical chest pain -cardiac CTA with mild nonobstructive (less than 25%) disease involving proximal LAD and calcium score of 1.  No recurrent episodes of sharp substernal chest pain since last seen 01/17/2020 and addition of amlodipine.  Continue amlodipine 2.5 mg daily as her previous symptoms may reflect coronary vasospasm.  No indication for further ischemic evaluation at this time.  EKG today with no acute ST/T wave changes.  2. HTN - BP well controlled. Continue current antihypertensive regimen.  She will monitor her BP at home and report BP routinely greater than 130/80.  At that time we could consider increased dose of carvedilol or amlodipine if needed. Refills provided.  3. HFpEF -weight down 3 pounds.  Euvolemic on exam.  No indication for loop diuretic at this time. Continue low salt, heart healthy diet..   Disposition: Follow up in 4 month(s) with Dr. Saunders Revel   Signed, Loel Dubonnet, NP 04/25/2020, 11:50 AM [place]

## 2020-04-25 NOTE — Patient Instructions (Signed)
Medication Instructions:  No medication changes today.  *If you need a refill on your cardiac medications before your next appointment, please call your pharmacy*  Lab Work: None ordered today.  Testing/Procedures: Your EKG today shows normal sinus rhythm.   Follow-Up: At [facility], you and your health needs are our priority.  As part of our continuing mission to provide you with exceptional heart care, we have created designated Provider Care Teams.  These Care Teams include your primary Cardiologist (physician) and Advanced Practice Providers (APPs -  Physician Assistants and Nurse Practitioners) who all work together to provide you with the care you need, when you need it.  We recommend signing up for the patient portal called "MyChart".  Sign up information is provided on this After Visit Summary.  MyChart is used to connect with patients for Virtual Visits (Telemedicine).  Patients are able to view lab/test results, encounter notes, upcoming appointments, etc.  Non-urgent messages can be sent to your provider as well.   To learn more about what you can do with MyChart, go to [email or web address].    Your next appointment:   4 month(s)  The format for your next appointment:   In Person  Provider:   You may see Nelva Bush, MD or one of the following Advanced Practice Providers on your designated Care Team:   Murray Hodgkins, NP  Christell Faith, PA-C  Marrianne Mood, PA-C  Laurann Montana, NP  Cadence Kathlen Mody, [place]  Other Instructions  Our goal is for your blood pressure to be consistently less than 130/80.   Tips to Measure your Blood Pressure Correctly  To determine whether you have hypertension, a medical professional will take a blood pressure reading. How you prepare for the test, the position of your arm, and other factors can change a blood pressure reading by 10% or more. That could be enough to hide high blood pressure, start you on a drug you don't  really need, or lead your doctor to incorrectly adjust your medications.  Here's what you can do to ensure a correct reading: . Don't drink a caffeinated beverage or smoke during the 30 minutes before the test. . Sit quietly for five minutes before the test begins. . During the measurement, sit in a chair with your feet on the floor and your arm supported so your elbow is at about heart level. . The inflatable part of the cuff should completely cover at least 80% of your upper arm, and the cuff should be placed on bare skin, not over a shirt. . Don't talk during the measurement. . Have your blood pressure measured twice, with a brief break in between. If the readings are different by 5 points or more, have it done a third time.  In 2017, new guidelines from the [employer], the [employer] of Cardiology, and nine other health organizations lowered the diagnosis of high blood pressure to 130/80 mm Hg or higher for all adults. The guidelines also redefined the various blood pressure categories to now include normal, elevated, Stage 1 hypertension, Stage 2 hypertension, and hypertensive crisis (see "Blood pressure categories").  Blood pressure categories  Blood pressure category SYSTOLIC (upper number)  DIASTOLIC (lower number)  Normal Less than 120 mm Hg and Less than 80 mm Hg  Elevated 120-129 mm Hg and Less than 80 mm Hg  High blood pressure: Stage 1 hypertension 130-139 mm Hg or 80-89 mm Hg  High blood pressure: Stage 2 hypertension 140 mm Hg or higher or  90 mm Hg or higher  Hypertensive crisis (consult your doctor immediately) Higher than 180 mm Hg and/or Higher than 120 mm Hg  Source: American Heart Association and American Stroke Association. For more on getting your blood pressure under control, buy Controlling Your Blood Pressure, a Special Health Report from [facility].   Blood Pressure Log   Date   Time  Blood Pressure  Position  Example: Nov 1  9 AM 124/78 sitting

## 2020-08-21 ENCOUNTER — Ambulatory Visit (INDEPENDENT_AMBULATORY_CARE_PROVIDER_SITE_OTHER): Payer: Medicare HMO

## 2020-08-21 ENCOUNTER — Other Ambulatory Visit: Payer: Self-pay

## 2020-08-21 ENCOUNTER — Encounter: Payer: Self-pay | Admitting: Podiatry

## 2020-08-21 ENCOUNTER — Ambulatory Visit: Payer: Medicare HMO | Admitting: Podiatry

## 2020-08-21 DIAGNOSIS — M7662 Achilles tendinitis, left leg: Secondary | ICD-10-CM

## 2020-08-21 DIAGNOSIS — M722 Plantar fascial fibromatosis: Secondary | ICD-10-CM

## 2020-08-21 MED ORDER — TRIAMCINOLONE ACETONIDE 40 MG/ML IJ SUSP
20.0000 mg | Freq: Once | INTRAMUSCULAR | Status: AC
  Administered 2020-08-21: 20 mg

## 2020-08-21 MED ORDER — METHYLPREDNISOLONE 4 MG PO TBPK
ORAL_TABLET | ORAL | 0 refills | Status: DC
Start: 2020-08-21 — End: 2020-09-09

## 2020-08-21 NOTE — Progress Notes (Signed)
She presents today chief complaint of some severe pain to the plantar heel about a week or so ago. States that is been aching severely for 3 days no injury no trauma that she remembers. She has pain anytime she is on it. She had to take some pain medicine that she had there at home for relief.  Objective: Vital signs are stable she alert oriented x3 pain on palpation of surgical site endoscopic plantar fasciotomy was performed. Radiographs taken today do not demonstrate any type of osseous abnormalities in the area of this new.  Assessment: Plan fasciitis possible tear of the plantar fascia or enthesopathy.  Plan: I injected the area today start her on a Medrol Dosepak I will follow-up with her in about 3 to 4 weeks if necessary

## 2020-08-23 ENCOUNTER — Ambulatory Visit: Payer: Medicare Other | Admitting: Podiatry

## 2020-08-28 ENCOUNTER — Telehealth: Payer: Self-pay

## 2020-08-28 NOTE — Telephone Encounter (Signed)
Patient called stated that the injection and Medrol dose pack did not help her pain in her left heel last week and she is in severe pain, cant hardly walk. She said she has been wearing her best shoes with her orthotics and elevating when she can and nothing is helping.  She can't take Meloxicam which causes diarrhea.  Please advise

## 2020-08-29 ENCOUNTER — Ambulatory Visit: Payer: Medicare HMO | Admitting: Family

## 2020-09-04 ENCOUNTER — Ambulatory Visit: Payer: Medicare HMO | Admitting: Podiatry

## 2020-09-04 ENCOUNTER — Encounter: Payer: Self-pay | Admitting: Podiatry

## 2020-09-04 ENCOUNTER — Other Ambulatory Visit: Payer: Self-pay

## 2020-09-04 DIAGNOSIS — M7662 Achilles tendinitis, left leg: Secondary | ICD-10-CM | POA: Diagnosis not present

## 2020-09-04 DIAGNOSIS — M722 Plantar fascial fibromatosis: Secondary | ICD-10-CM

## 2020-09-04 NOTE — Progress Notes (Signed)
She presents today after having not seen her for couple weeks with a chief complaint of severe pain of the left surgical foot.  She is doing well for quite some time and has of late started to develop severe debilitating pain in her left foot.  She states that is periodic.  The injections that we provided the steroids and the injections all to no avail did not alleviate her symptoms at all.  Objective: Vital signs are stable she is alert oriented x3.  Pulses are palpable.  Severe pain on palpation medial calcaneal tubercle of the left heel.  No pain on medial lateral compression of the calcaneus.  Assessment: This could be a stress fracture that we can see on x-ray corresponding neurologic I really doubt that this plantar fascial of the lesser is a complete rupture of the plantar fascia from the previous endoscopic fasciotomy.  With radiating pain it could be kart tarsal tunnel as well though there was no Tinel's on palpation.  Plan: Requesting a urgent MRI due to the pain that she is experiencing and I am requesting that she get back into her cam walker at all times.

## 2020-09-09 ENCOUNTER — Ambulatory Visit: Payer: Medicare HMO | Admitting: Family

## 2020-09-09 ENCOUNTER — Other Ambulatory Visit: Payer: Self-pay

## 2020-09-09 ENCOUNTER — Encounter: Payer: Self-pay | Admitting: Family

## 2020-09-09 VITALS — BP 162/98 | HR 92 | Ht 63.0 in | Wt 229.0 lb

## 2020-09-09 DIAGNOSIS — I10 Essential (primary) hypertension: Secondary | ICD-10-CM

## 2020-09-09 DIAGNOSIS — I5032 Chronic diastolic (congestive) heart failure: Secondary | ICD-10-CM | POA: Diagnosis not present

## 2020-09-09 DIAGNOSIS — E875 Hyperkalemia: Secondary | ICD-10-CM | POA: Diagnosis not present

## 2020-09-09 DIAGNOSIS — D72829 Elevated white blood cell count, unspecified: Secondary | ICD-10-CM | POA: Diagnosis not present

## 2020-09-09 MED ORDER — AMLODIPINE BESYLATE 5 MG PO TABS: 5.0000 mg | ORAL_TABLET | Freq: Every day | ORAL | 1 refills | Status: DC

## 2020-09-09 NOTE — Patient Instructions (Addendum)
Medication Instructions:  Your physician has recommended you make the following change in your medication:   CHANGE Amlodipine to 5mg  daily  *If you need a refill on your cardiac medications before your next appointment, please call your pharmacy*   Lab Work: Your physician recommends lab work today: CBC, CMP  We will forward these results to your primary care provider.   If you have labs (blood work) drawn today and your tests are completely normal, you will receive your results only by: Marland Kitchen MyChart Message (if you have MyChart) OR . A paper copy in the mail If you have any lab test that is abnormal or we need to change your treatment, we will call you to review the results.   Testing/Procedures: None ordered today.   Follow-Up: At [facility], you and your health needs are our priority.  As part of our continuing mission to provide you with exceptional heart care, we have created designated Provider Care Teams.  These Care Teams include your primary Cardiologist (physician) and Advanced Practice Providers (APPs -  Physician Assistants and Nurse Practitioners) who all work together to provide you with the care you need, when you need it.  We recommend signing up for the patient portal called "MyChart".  Sign up information is provided on this After Visit Summary.  MyChart is used to connect with patients for Virtual Visits (Telemedicine).  Patients are able to view lab/test results, encounter notes, upcoming appointments, etc.  Non-urgent messages can be sent to your provider as well.   To learn more about what you can do with MyChart, go to [email or web address].    Your next appointment:   3 month(s)  The format for your next appointment:   In Person  Provider:   You may see Nelva Bush, MD or one of the following Advanced Practice Providers on your designated Care Team:    Murray Hodgkins, NP  Christell Faith, PA-C  Marrianne Mood, PA-C  Cadence Kathlen Mody,  [place]  Laurann Montana, NP  Other Instructions  Recommend checking your blood pressure once per day with an arm cuff.  Heart Healthy Diet Recommendations: A low-salt diet is recommended. Meats should be grilled, baked, or boiled. Avoid fried foods. Focus on lean protein sources like fish or chicken with vegetables and fruits. The American Heart Association is a Microbiologist!  American Heart Association Diet and Lifeystyle Recommendations   Tips to Measure your Blood Pressure Correctly  To determine whether you have hypertension, a medical professional will take a blood pressure reading. How you prepare for the test, the position of your arm, and other factors can change a blood pressure reading by 10% or more. That could be enough to hide high blood pressure, start you on a drug you don't really need, or lead your doctor to incorrectly adjust your medications.  National and international guidelines offer specific instructions for measuring blood pressure. If a doctor, nurse, or medical assistant isn't doing it right, don't hesitate to ask him or her to get with the guidelines.  Here's what you can do to ensure a correct reading: . Don't drink a caffeinated beverage or smoke during the 30 minutes before the test. . Sit quietly for five minutes before the test begins. . During the measurement, sit in a chair with your feet on the floor and your arm supported so your elbow is at about heart level. . The inflatable part of the cuff should completely cover at least 80% of your upper arm, and the  cuff should be placed on bare skin, not over a shirt. . Don't talk during the measurement. . Have your blood pressure measured twice, with a brief break in between. If the readings are different by 5 points or more, have it done a third time.  In 2017, new guidelines from the [employer], the [employer] of Cardiology, and nine other health organizations lowered the diagnosis of high  blood pressure to 130/80 mm Hg or higher for all adults. The guidelines also redefined the various blood pressure categories to now include normal, elevated, Stage 1 hypertension, Stage 2 hypertension, and hypertensive crisis (see "Blood pressure categories").  Blood pressure categories  Blood pressure category SYSTOLIC (upper number)  DIASTOLIC (lower number)  Normal Less than 120 mm Hg and Less than 80 mm Hg  Elevated 120-129 mm Hg and Less than 80 mm Hg  High blood pressure: Stage 1 hypertension 130-139 mm Hg or 80-89 mm Hg  High blood pressure: Stage 2 hypertension 140 mm Hg or higher or 90 mm Hg or higher  Hypertensive crisis (consult your doctor immediately) Higher than 180 mm Hg and/or Higher than 120 mm Hg  Source: American Heart Association and American Stroke Association. For more on getting your blood pressure under control, buy Controlling Your Blood Pressure, a Special Health Report from [facility].   Blood Pressure Log   Date   Time  Blood Pressure  Position  Example: Nov 1 9 AM 124/78 sitting

## 2020-09-09 NOTE — Progress Notes (Signed)
Office Visit    Patient Name: Misty Hebert Date of Encounter: 09/10/2020  Primary Care Provider:  Earlie Counts, FNP Primary Cardiologist:  Nelva Bush, MD Electrophysiologist:  None   Chief Complaint    Misty Hebert is a 70 y.o. female with a hx of intermittent chest pain, HTN, HLD, DVT, GERD, hiatal hernia, nonobstructive coronary artery disease presents today for follow up of hypertension  Past Medical History    Past Medical History:  Diagnosis Date  . Arthritis of knee   . Benign essential HTN   . Chest pain    a. she reports 4 normal heart caths; b. 12/2013 St Echo: nl Jefm Bryant); c. 04/2016 MV: apical and ant defect @ rest consistent w/ breast attenuation. No ischemia. EF 55-65%.  . Depression   . Diverticulitis of intestine w/o perforation or abscess w/o bleeding   . DVT (deep venous thrombosis) ([place])   . Dysphagia   . Fibromyalgia   . High cholesterol   . History of colon resection   . History of echocardiogram    a. 01/2017 Echo ([facility]): nl LV syst fxn.  . Hyperlipidemia   . Medial meniscus tear   . Migraine   . Neuromuscular disorder ([place])   . Obesity   . Overactive bladder   . Reflux   . Subchondral insufficiency fracture of condyle of femur ([place])   . Tension headache    Past Surgical History:  Procedure Laterality Date  . ABDOMINAL HYSTERECTOMY    . BREAST BIOPSY Right    CYST REMOVED-16-35YRS AGO-NEG  . CARDIAC CATHETERIZATION  1995   [facility]  . CARDIAC CATHETERIZATION  2001   [facility]  . CARDIAC CATHETERIZATION  2006   [facility]  . CARDIAC CATHETERIZATION     [facility] with Dr. Nehemiah Massed   . COLON RESECTION    . FOOT SURGERY Left 01/10/2016   03/06/16 second foot surgery  . KNEE SURGERY    . SHOULDER SURGERY      Allergies  Allergies  Allergen Reactions  . Chlorpromazine Anaphylaxis and Other (See Comments)    Other Reaction: LOC Other Reaction: LOC Other Reaction: LOC Other Reaction: LOC   . Metoclopramide Other  (See Comments) and Anaphylaxis    Other Reaction: RESTLESS Twitching , cant sit still  Other Reaction: RESTLESS Other Reaction: RESTLESS Other Reaction: RESTLESS   . Sulfamethizole Nausea And Vomiting and Other (See Comments)  . Other Other (See Comments)    Torazine gave her syncope Upset stomach Upset stomach   . Celecoxib Nausea Only and Anxiety  . Meloxicam Other (See Comments) and Diarrhea    History of Present Illness    Misty Hebert is a 70 y.o. female with a hx of intermittent chest pain, HTN, HLD, DVT, GERD, hiatal hernia, minimal nonobstructive coronary artery disease by cardiac CTA 12/28/19 last seen 04/25/20  She has a history of episodic sharp, stabbing chest pain lasting 5 to 10 minutes at a time.  Her symptoms previously improved a little bit of pantoprazole but she stopped taking it ultimately out of concern for kidney issues.  Subsequent cardiac CTA June 2021 with mild nonobstructive disease (less than 25%) and calcium score of 1.  Seen in follow-up 01/17/2020,  was started on amlodipine 2.5 mg daily for improved blood pressure control and to exclude coronary vasospasm is contributory to symptoms.   She was seen in follow up 04/25/20 doing overall well from cardiac perspective. LE edema had improved, no recurrent  chest pain, and BP well controlled. No changes were made.   She presents today for follow up.  Endorses lots of recent stress related to back pain, hip pain, acute injury to left foot which she is following with podiatry for and has MRI upcoming.  She has lost 2 family members recently.  She just finished a course of prednisone about a week ago.  Attributes all of this to her elevated blood pressure.  She has not been checking at home. Reports no shortness of breath nor dyspnea on exertion. Reports no chest pain, pressure, or tightness. No edema, orthopnea, PND. Reports no palpitations. She has lost weight predominantly through changing her diet.  Tells me she  has stopped snacking and has reduced her intake of carbohydrates.   EKGs/Labs/Other Studies Reviewed:   The following studies were reviewed today:  Cardiac CTA 12/28/19 IMPRESSION: 1. Minimal non-obstructive CAD, CADRADS = 1.   2. Coronary calcium score of 0. This was 0 percentile for age and sex matched control.   3. Normal coronary origin with right dominance.   4. Aortic atherosclerosis.   EKG:  EKG is ordered today.  The ekg ordered today demonstrates NSR 67 bpmw with left axis deviation and no acute ST/T wave changes.   Recent Labs: 09/09/2020: ALT 30; BUN 14; Creatinine, Ser 0.82; Hemoglobin 16.2; Platelets 188; Potassium 4.6; Sodium 143  Recent Lipid Panel No results found for: CHOL, TRIG, HDL, CHOLHDL, VLDL, LDLCALC, LDLDIRECT  Home Medications   Current Meds  Medication Sig  . amLODipine (NORVASC) 5 MG tablet Take 1 tablet (5 mg total) by mouth daily.  Marland Kitchen aspirin EC 81 MG tablet Take 81 mg by mouth daily.  . carvedilol (COREG) 3.125 MG tablet Take 1 tablet (3.125 mg total) by mouth 2 (two) times daily with a meal.  . hydrochlorothiazide (HYDRODIURIL) 25 MG tablet Take 1 tablet (25 mg total) by mouth daily.  Marland Kitchen losartan (COZAAR) 50 MG tablet Take 1 tablet (50 mg total) by mouth daily.  . nitroGLYCERIN (NITROSTAT) 0.4 MG SL tablet Place 1 tablet (0.4 mg total) under the tongue every 5 (five) minutes as needed for chest pain (maximum of 3 doses.).  Marland Kitchen rosuvastatin (CRESTOR) 5 MG tablet Take 5 mg by mouth daily.  . traMADol (ULTRAM) 50 MG tablet Take 50 mg by mouth 2 (two) times daily as needed.   . [DISCONTINUED] amLODipine (NORVASC) 2.5 MG tablet Take 1 tablet (2.5 mg total) by mouth daily.     Review of Systems   Review of Systems  Constitutional: Negative for chills, fever and malaise/fatigue.  Cardiovascular: Negative for chest pain, dyspnea on exertion, leg swelling, near-syncope, orthopnea, palpitations and syncope.  Respiratory: Negative for cough, shortness of  breath and wheezing.   Gastrointestinal: Negative for nausea and vomiting.  Neurological: Negative for dizziness, light-headedness and weakness.   All other systems reviewed and are otherwise negative except as noted above.  Physical Exam    VS:  BP (!) 162/98 (BP Location: Left Arm, Patient Position: Sitting, Cuff Size: Large)   Pulse 92   Ht 5\' 3"  (1.6 m)   Wt 229 lb (103.9 kg)   LMP  (LMP Unknown)   SpO2 97%   BMI 40.57 kg/m  , BMI Body mass index is 40.57 kg/m.  Wt Readings from Last 3 Encounters:  09/09/20 229 lb (103.9 kg)  04/25/20 232 lb (105.2 kg)  01/17/20 235 lb 6 oz (106.8 kg)    GEN: Well nourished, overweight, well developed, in no  acute distress. HEENT: normal. Neck: Supple, no JVD, carotid bruits, or masses. Cardiac: RRR, no murmurs, rubs, or gallops. No clubbing, cyanosis, edema.  Radials/DP/PT 2+ and equal bilaterally.  Respiratory:  Respirations regular and unlabored, clear to auscultation bilaterally. GI: Soft, nontender, nondistended. MS: No deformity or atrophy.L foot in orthopedic boot.  Skin: Warm and dry, no rash. Neuro:  Strength and sensation are intact. Psych: Normal affect.  Assessment & Plan    1. Atypical chest pain -cardiac CTA with mild nonobstructive (less than 25%) disease involving proximal LAD and calcium score of 1.  No recurrent episodes of sharp substernal chest pain.  No indication for further evaluation at this time.   2. HTN -BP elevated in the setting of significant stress, pain.  Increase amlodipine to 5 mg daily.  Encouraged home monitoring.  Continue HCTZ 25 mg daily, losartan 50 mg daily, Coreg 3.125mg  BID.  CBC, CMP today for monitoring.  If BP not at goal consider further increased dose of amlodipine versus increased dose of losartan.  3. HFpEF -weight down 3 pounds compared to last clinic visit..  Euvolemic on exam.  GDMT includes losartan. , Coreg. No indication for loop diuretic at this time. Continue low salt, heart healthy  diet..  4. Hyperkalemia - Recent lab work with PCP 08/19/20 with K 5.2. Will collect repeat for her today in clinic to prevent her another trip to PCP. Patient appreciative.   5. Leukocytosis - Recent lab work at PCP 08/19/20 with WBC 14.9. Will collect repeat for monitoring and forward to PCP .   Disposition: Follow up in 3 month(s) with Dr. Saunders Revel   Signed, Loel Dubonnet, NP 09/10/2020, 7:05 PM [place]

## 2020-09-10 ENCOUNTER — Ambulatory Visit
Admission: RE | Admit: 2020-09-10 | Discharge: 2020-09-10 | Disposition: A | Discharge status: Home / Self Care | Payer: Medicare HMO | Source: Ambulatory Visit | Attending: Podiatry | Admitting: Podiatry

## 2020-09-10 DIAGNOSIS — M722 Plantar fascial fibromatosis: Secondary | ICD-10-CM

## 2020-09-10 LAB — COMPREHENSIVE METABOLIC PANEL
ALT: 30 IU/L (ref 0–32)
AST: 20 IU/L (ref 0–40)
Albumin/Globulin Ratio: 1.9 (ref 1.2–2.2)
Albumin: 4 g/dL (ref 3.8–4.8)
Alkaline Phosphatase: 135 IU/L — ABNORMAL HIGH (ref 44–121)
BUN/Creatinine Ratio: 17 (ref 12–28)
BUN: 14 mg/dL (ref 8–27)
Bilirubin Total: 0.5 mg/dL (ref 0.0–1.2)
CO2: 20 mmol/L (ref 20–29)
Calcium: 9.1 mg/dL (ref 8.7–10.3)
Chloride: 108 mmol/L — ABNORMAL HIGH (ref 96–106)
Creatinine, Ser: 0.82 mg/dL (ref 0.57–1.00)
Globulin, Total: 2.1 g/dL (ref 1.5–4.5)
Glucose: 147 mg/dL — ABNORMAL HIGH (ref 65–99)
Potassium: 4.6 mmol/L (ref 3.5–5.2)
Sodium: 143 mmol/L (ref 134–144)
Total Protein: 6.1 g/dL (ref 6.0–8.5)
eGFR: 77 mL/min/{1.73_m2} (ref >59)

## 2020-09-10 LAB — CBC
Hematocrit: 47.7 % — ABNORMAL HIGH (ref 34.0–46.6)
Hemoglobin: 16.2 g/dL — ABNORMAL HIGH (ref 11.1–15.9)
MCH: 31.5 pg (ref 26.6–33.0)
MCHC: 34 g/dL (ref 31.5–35.7)
MCV: 93 fL (ref 79–97)
Platelets: 188 10*3/uL (ref 150–450)
RBC: 5.15 x10E6/uL (ref 3.77–5.28)
RDW: 12.6 % (ref 11.7–15.4)
WBC: 7.6 10*3/uL (ref 3.4–10.8)

## 2020-09-11 ENCOUNTER — Telehealth: Payer: Self-pay | Admitting: *Deleted

## 2020-09-11 ENCOUNTER — Encounter: Payer: Medicare HMO | Admitting: Podiatry

## 2020-09-11 NOTE — Telephone Encounter (Signed)
Called patient - informed her of MRI results and recommendations  Patient said she was wearing the boot everyday, but by the end of the day her whole body felt out of sorts. Advised she should wear the boot as much as possible and limit activity. She wanted to go ahead and schedule a follow up appt with Dr. Milinda Pointer in 3 weeks which I did for her for March 30th.

## 2020-09-11 NOTE — Telephone Encounter (Signed)
-----   Message from Garrel Ridgel, [place] sent at 09/10/2020  5:11 PM EST ----- Please let her know that it is a total tear of the plantar fascia.  Ask her how she is doing in the boot.

## 2020-10-02 ENCOUNTER — Ambulatory Visit: Payer: Medicare HMO | Admitting: Podiatry

## 2020-10-02 ENCOUNTER — Encounter: Payer: Self-pay | Admitting: Podiatry

## 2020-10-02 ENCOUNTER — Other Ambulatory Visit: Payer: Self-pay

## 2020-10-02 DIAGNOSIS — M7662 Achilles tendinitis, left leg: Secondary | ICD-10-CM

## 2020-10-02 DIAGNOSIS — M722 Plantar fascial fibromatosis: Secondary | ICD-10-CM | POA: Diagnosis not present

## 2020-10-02 NOTE — Progress Notes (Signed)
She presents today states that her foot is doing better but her cam walker is really causing her right hip to hurt.  States that the left foot still is painful but not like it was.  Objective: Vital signs stable she is alert oriented x3.  Pulses are palpable.  Previous MRI demonstrated a total tear of the plantar fascia.  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  Plan: Put her in a short cam walker encouraged her to get back into her good tennis shoes discussed appropriate shoe gear with her today did not she also has a night splint which she will utilize at home.  Follow-up with her in a month or so

## 2020-10-24 ENCOUNTER — Encounter: Payer: Self-pay | Admitting: Emergency Medicine

## 2020-10-24 ENCOUNTER — Other Ambulatory Visit: Payer: Self-pay

## 2020-10-24 ENCOUNTER — Ambulatory Visit
Admission: EM | Admit: 2020-10-24 | Discharge: 2020-10-24 | Disposition: A | Discharge status: Home / Self Care | Payer: Medicare HMO | Attending: Physician Assistant | Admitting: Physician Assistant

## 2020-10-24 DIAGNOSIS — U071 COVID-19: Secondary | ICD-10-CM | POA: Insufficient documentation

## 2020-10-24 LAB — RESP PANEL BY RT-PCR (FLU A&B, COVID) ARPGX2
Influenza A by PCR: NEGATIVE
Influenza B by PCR: NEGATIVE
SARS Coronavirus 2 by RT PCR: POSITIVE — AB

## 2020-10-24 MED ORDER — BENZONATATE 200 MG PO CAPS: 200.0000 mg | ORAL_CAPSULE | Freq: Two times a day (BID) | ORAL | 0 refills | Status: DC | PRN

## 2020-10-24 MED ORDER — ACETAMINOPHEN 325 MG PO TABS
650.0000 mg | ORAL_TABLET | Freq: Once | ORAL | Status: AC
  Administered 2020-10-24: 650 mg via ORAL

## 2020-10-24 MED ORDER — HYDROCODONE-HOMATROPINE 5-1.5 MG/5ML PO SYRP: ORAL_SOLUTION | ORAL | 0 refills | Status: DC

## 2020-10-24 MED ORDER — HYDROXYCHLOROQUINE SULFATE 200 MG PO TABS: ORAL_TABLET | ORAL | 0 refills | Status: DC

## 2020-10-24 NOTE — Discharge Instructions (Addendum)
Take Zinc 50 mg with the second dose of the Hydroxychloroquin which  helps the Zinc channels open up for the Zinc to get into the cell and reduce the virus reproducing into your system. STOP TAKING THE TRAMADOL WHILE ON THE HYDROXYCHLOROPQUIN Take Tyelnol 500 mg every 6 hours, max 3,000 mg in 24 h for fever and body aches.   If they dont have the Hydroxychloroquin, then get the following;  Stay quarantined for 5 days and wear the mask for the next 5 days after that even around family.   Take Quarcetin 500 mg three times a day x 7 days with Zinc 50 mg ones a day x 7 days. The quarcetin is an antiviral and anti-inflammatory supplement which helps open the zinc channels in the cell to absorb Zinc. Zinc helps decrease the virus load in your body. Take Melatonin 6-10 mg at bed time which also helps support your immune system.  Also make sure to take Vit D 5,000 IU per day with a fatty meal and Vit C 5000 mg a day until you are completely better. To prevent viral illnesses your vitamin D should be between 60-80. Stay on Vitamin D 2,000  and C  1000 mg the rest of the season.  Don't lay around, keep active and walk as much as you are able to to prevent worsening of your symptoms.  Follow up with your family Dr next week.  If you get short of breath and you are able to check  your oxygen with a pulse oxygen meter, if it gets to 92% or less, you need to go to the hospital to be admitted. If you dont have one, come back here and we will assess you.

## 2020-10-24 NOTE — ED Provider Notes (Signed)
[facility]    CSN: [id number] Arrival date & time: 10/24/20  4431      History   Chief Complaint Chief Complaint  Patient presents with  . Generalized Body Aches  . Cough  . Fatigue    HPI Misty Hebert is a 70 y.o. female who presents with onset of HA Sat, then the next day HA, nausea and abd pain. Then yesterday developed  cough, fatigue, body aches since yesterday. Has felt feverish but has not taken her temp. Has been taking OTC  Cold/ fly meds but is not helping. She was at [facility] last week and the family whom she stayed with have not been sick.  She has had covid injections x 2, but no booster.  She has never had Covid infection.   Past Medical History:  Diagnosis Date  . Arthritis of knee   . Benign essential HTN   . Chest pain    a. she reports 4 normal heart caths; b. 12/2013 St Echo: nl Jefm Bryant); c. 04/2016 MV: apical and ant defect @ rest consistent w/ breast attenuation. No ischemia. EF 55-65%.  . Depression   . Diverticulitis of intestine w/o perforation or abscess w/o bleeding   . DVT (deep venous thrombosis) ([place])   . Dysphagia   . Fibromyalgia   . High cholesterol   . History of colon resection   . History of echocardiogram    a. 01/2017 Echo ([facility]): nl LV syst fxn.  . Hyperlipidemia   . Medial meniscus tear   . Migraine   . Neuromuscular disorder ([place])   . Obesity   . Overactive bladder   . Reflux   . Subchondral insufficiency fracture of condyle of femur ([employer])   . Tension headache     Patient Active Problem List   Diagnosis Date Noted  . Chronic heart failure with preserved ejection fraction (HFpEF) ([place]) 01/17/2020  . Dermatochalasis of both upper eyelids 08/08/2018  . Ptosis, both eyelids 08/08/2018  . Visual field defect 08/08/2018  . Leukocytosis 06/07/2018  . Low back pain 06/07/2018  . Morbid obesity ([employer]) 06/07/2018  . Osteoarthritis of right hip 06/07/2018  . Primary localized osteoarthritis of pelvic region and  thigh 06/07/2018  . On long term drug therapy 02/24/2018  . History of total hip arthroplasty 01/04/2018  . Carpal tunnel syndrome 11/17/2017  . Mild persistent asthma without complication 54/00/8676  . Dyspnea 11/23/2016  . Herpes simplex infection 11/23/2016  . Sore throat 11/23/2016  . Atypical chest pain 07/30/2016  . Essential hypertension 07/30/2016  . Morbid obesity with BMI of 40.0-44.9, adult ([place]) 07/30/2016  . Pain in right knee 07/29/2016  . Gastroesophageal reflux disease 04/23/2016  . History of esophageal ulcer 04/23/2016  . Arthritis of knee 10/04/2013  . S/P colon resection 07/25/2013  . Diverticulitis of intestine w/o perforation or abscess w/o bleeding 07/26/2012  . DVT (deep venous thrombosis) ([place]) 07/26/2012  . Neuromuscular disorder ([place]) 04/12/2012  . Hip pain 01/14/2012  . Trochanteric bursitis of left hip 12/22/2011  . Depression 04/06/2011  . Fibromyalgia 04/06/2011  . Hyperlipidemia, mixed 04/06/2011  . Migraine 04/06/2011  . Overactive bladder 04/06/2011  . Tension type headache 04/06/2011    Past Surgical History:  Procedure Laterality Date  . ABDOMINAL HYSTERECTOMY    . BREAST BIOPSY Right    CYST REMOVED-16-31YRS AGO-NEG  . CARDIAC CATHETERIZATION  1995   [facility]  . CARDIAC CATHETERIZATION  2001   [facility]  . CARDIAC CATHETERIZATION  2006   [facility]  . CARDIAC CATHETERIZATION     [facility] with Dr. Nehemiah Massed   . COLON RESECTION    . FOOT SURGERY Left 01/10/2016   03/06/16 second foot surgery  . KNEE SURGERY    . SHOULDER SURGERY      OB History   No obstetric history on file.      Home Medications    Prior to Admission medications   Medication Sig Start Date End Date Taking? Authorizing Provider  amLODipine (NORVASC) 5 MG tablet Take 1 tablet (5 mg total) by mouth daily. 09/09/20 03/08/21 Yes Loel Dubonnet, NP  benzonatate (TESSALON) 200 MG capsule Take 1 capsule (200 mg total) by mouth 2 (two) times daily as needed  for cough. 10/24/20  Yes Rodriguez-Southworth, Sunday Spillers, PA-C  carvedilol (COREG) 3.125 MG tablet Take 1 tablet (3.125 mg total) by mouth 2 (two) times daily with a meal. 04/25/20  Yes Loel Dubonnet, NP  dicyclomine (BENTYL) 10 MG capsule Take 10 mg by mouth 2 (two) times daily as needed. 08/19/20  Yes [provider]  hydrochlorothiazide (HYDRODIURIL) 25 MG tablet Take 1 tablet (25 mg total) by mouth daily. 04/25/20  Yes Loel Dubonnet, NP  HYDROcodone-homatropine [facility]) 5-1.5 MG/5ML syrup 5 ml qhs if tessalon perless is not helping 10/24/20  Yes Rodriguez-Southworth, Sunday Spillers, PA-C  hydroxychloroquine (PLAQUENIL) 200 MG tablet One bid x 7 days for Covid infection   Has positive covid test 10/24/20  Yes Rodriguez-Southworth, Sunday Spillers, PA-C  losartan (COZAAR) 50 MG tablet Take 1 tablet (50 mg total) by mouth daily. 04/25/20  Yes Loel Dubonnet, NP  nitroGLYCERIN (NITROSTAT) 0.4 MG SL tablet Place 1 tablet (0.4 mg total) under the tongue every 5 (five) minutes as needed for chest pain (maximum of 3 doses.). 01/17/20  Yes End, Harrell Gave, MD  rosuvastatin (CRESTOR) 5 MG tablet Take 5 mg by mouth daily.   Yes [provider]    Family History Family History  Problem Relation Age of Onset  . Breast cancer Maternal Aunt 83  . Heart attack Mother   . Heart disease Mother        pacemaker/ICD  . Heart attack Father   . Stroke Father   . Lung cancer Father   . Heart disease Brother        stents in early 63's    Social History Social History   Tobacco Use  . Smoking status: Never Smoker  . Smokeless tobacco: Never Used  Vaping Use  . Vaping Use: Never used  Substance Use Topics  . Alcohol use: No  . Drug use: No     Allergies   Chlorpromazine, Metoclopramide, Sulfamethizole, Other, Celecoxib, and Meloxicam   Review of Systems Review of Systems  Constitutional: Positive for appetite change, chills, fatigue and fever.  HENT: Positive for congestion, ear  pain, postnasal drip and rhinorrhea. Negative for ear discharge, sore throat and trouble swallowing.   Eyes: Negative for discharge.  Respiratory: Positive for cough. Negative for chest tightness and shortness of breath.   Gastrointestinal: Negative for abdominal pain, diarrhea, nausea and vomiting.  Genitourinary: Negative for dysuria, frequency, hematuria and urgency.  Musculoskeletal: Positive for myalgias. Negative for gait problem.  Skin: Negative for rash.  Neurological: Positive for headaches.  Hematological: Negative for adenopathy.   Physical Exam Triage Vital Signs ED Triage Vitals  Enc Vitals Group     BP 10/24/20 1100 (!) 159/83     Pulse Rate 10/24/20 1100 95  Resp 10/24/20 1100 18     Temp 10/24/20 1100 100.2 F (37.9 C)     Temp Source 10/24/20 1100 Oral     SpO2 10/24/20 1100 97 %     Weight 10/24/20 1101 225 lb (102.1 kg)     Height 10/24/20 1101 5\' 3"  (1.6 m)     Head Circumference --      Peak Flow --      Pain Score 10/24/20 1101 8     Pain Loc --      Pain Edu? --      Excl. in [place]? --    No data found.  Updated Vital Signs BP (!) 159/83 (BP Location: Left Arm)   Pulse 95   Temp 100.2 F (37.9 C) (Oral)   Resp 18   Ht 5\' 3"  (1.6 m)   Wt 225 lb (102.1 kg)   LMP  (LMP Unknown)   SpO2 97%   BMI 39.86 kg/m   Visual Acuity Right Eye Distance:   Left Eye Distance:   Bilateral Distance:    Right Eye Near:   Left Eye Near:    Bilateral Near:     Physical Exam Vitals and nursing note reviewed.  Constitutional:      Appearance: She is obese. She is ill-appearing. She is not toxic-appearing or diaphoretic.  HENT:     Head: Normocephalic.     Right Ear: Tympanic membrane, ear canal and external ear normal.     Left Ear: Tympanic membrane, ear canal and external ear normal.     Mouth/Throat:     Mouth: Mucous membranes are moist.     Pharynx: Oropharynx is clear.  Eyes:     General: No scleral icterus.    Conjunctiva/sclera: Conjunctivae  normal.  Pulmonary:     Effort: Pulmonary effort is normal.  Musculoskeletal:        General: Normal range of motion.     Cervical back: Neck supple. No rigidity.  Lymphadenopathy:     Cervical: No cervical adenopathy.  Skin:    General: Skin is warm and dry.     Findings: No rash.  Neurological:     Mental Status: She is alert and oriented to person, place, and time.     Gait: Gait normal.  Psychiatric:        Mood and Affect: Mood normal.        Behavior: Behavior normal.        Thought Content: Thought content normal.        Judgment: Judgment normal.      UC Treatments / Results  Labs (all labs ordered are listed, but only abnormal results are displayed) Labs Reviewed  RESP PANEL BY RT-PCR (FLU A&B, COVID) ARPGX2 - Abnormal; Notable for the following components:      Result Value   SARS Coronavirus 2 by RT PCR POSITIVE (*)    All other components within normal limits    EKG   Radiology No results found.  Procedures Procedures (including critical care time)  Medications Ordered in UC Medications  acetaminophen (TYLENOL) tablet 650 mg (650 mg Oral Given 10/24/20 1131)    Initial Impression / Assessment and Plan / UC Course  I have reviewed the triage vital signs and the nursing notes. Pertinent labs results that were available during my care of the patient were reviewed by me and considered in my medical decision making (see chart for details). I placed her on Hydroxychloroquine 200 mg bid x 7  days. Tessalon for day time cough. Hycodan for night  Time cough. See instructions.   Final Clinical Impressions(s) / UC Diagnoses   Final diagnoses:  [id number] virus infection     Discharge Instructions     Take Zinc 50 mg with the second dose of the Hydroxychloroquin which  helps the Zinc channels open up for the Zinc to get into the cell and reduce the virus reproducing into your system. STOP TAKING THE TRAMADOL WHILE ON THE HYDROXYCHLOROPQUIN Take Tyelnol 500  mg every 6 hours, max 3,000 mg in 24 h for fever and body aches.   If they dont have the Hydroxychloroquin, then get the following;  Stay quarantined for 5 days and wear the mask for the next 5 days after that even around family.   Take Quarcetin 500 mg three times a day x 7 days with Zinc 50 mg ones a day x 7 days. The quarcetin is an antiviral and anti-inflammatory supplement which helps open the zinc channels in the cell to absorb Zinc. Zinc helps decrease the virus load in your body. Take Melatonin 6-10 mg at bed time which also helps support your immune system.  Also make sure to take Vit D 5,000 IU per day with a fatty meal and Vit C 5000 mg a day until you are completely better. To prevent viral illnesses your vitamin D should be between 60-80. Stay on Vitamin D 2,000  and C  1000 mg the rest of the season.  Don't lay around, keep active and walk as much as you are able to to prevent worsening of your symptoms.  Follow up with your family Dr next week.  If you get short of breath and you are able to check  your oxygen with a pulse oxygen meter, if it gets to 92% or less, you need to go to the hospital to be admitted. If you dont have one, come back here and we will assess you.      ED Prescriptions    Medication Sig Dispense Auth. Provider   benzonatate (TESSALON) 200 MG capsule Take 1 capsule (200 mg total) by mouth 2 (two) times daily as needed for cough. 30 capsule Rodriguez-Southworth, Sunday Spillers, PA-C   hydroxychloroquine (PLAQUENIL) 200 MG tablet One bid x 7 days for Covid infection   Has positive covid test 14 tablet Rodriguez-Southworth, Jasminemarie Sherrard, PA-C   HYDROcodone-homatropine (HYCODAN) 5-1.5 MG/5ML syrup 5 ml qhs if tessalon perless is not helping 120 mL Rodriguez-Southworth, Sunday Spillers, PA-C     PDMP not reviewed this encounter.   Shelby Mattocks, PA-C 10/25/20 1416

## 2020-10-24 NOTE — ED Triage Notes (Addendum)
Patient in today c/o cough, fatigue and body aches x 1 day. Patient states she has felt feverish, but hasn't taken her temperature. Patient has taken OTC cold/flu without relief. Patient has had the covid vaccines, no booster. Patient states she was at [facility] last week and did not wear a mask.

## 2020-10-25 ENCOUNTER — Telehealth: Payer: Self-pay | Admitting: Internal Medicine

## 2020-10-25 ENCOUNTER — Telehealth: Payer: Self-pay

## 2020-10-25 NOTE — Telephone Encounter (Signed)
I called pt to check on her and left her a voice mail where I was working today( [facility])  if she had any questions.

## 2020-10-25 NOTE — Telephone Encounter (Signed)
Called to discuss with patient about COVID-19 symptoms and the use of one of the available treatments for those with mild to moderate Covid symptoms and at a high risk of hospitalization.  Pt appears to qualify for outpatient treatment due to co-morbid conditions and/or a member of an at-risk group in accordance with the FDA Emergency Use Authorization.    Symptom onset: Cough,body aches,fatigue Vaccinated: Unknown Booster? Unknown Immunocompromised? Yes Qualifiers: CHF,HTN,Asthma  Unable to reach pt - Left message and call back number [phone].   Marcello Moores

## 2020-11-13 ENCOUNTER — Encounter: Payer: Self-pay | Admitting: Podiatry

## 2020-11-13 ENCOUNTER — Ambulatory Visit (INDEPENDENT_AMBULATORY_CARE_PROVIDER_SITE_OTHER): Payer: Medicare HMO | Admitting: Podiatry

## 2020-11-13 ENCOUNTER — Other Ambulatory Visit: Payer: Self-pay

## 2020-11-13 DIAGNOSIS — M7662 Achilles tendinitis, left leg: Secondary | ICD-10-CM | POA: Diagnosis not present

## 2020-11-13 NOTE — Progress Notes (Signed)
She presents today for follow up of her [place] fasciitis of her left foot.  She states that all in all is doing much better than it was she states that she went to the store and is a little bit sore but she is has a new pair shoes that she has been wearing.  Objective: Vital signs are stable she is alert and oriented x3 still has pain on palpation near the insertion site of the heel itself.  At this point I feel that the majority of her pain is most likely due to heel and intrinsic muscle tears as opposed to the fascial tear.  Assessment: Slowly healing tears intrinsic foot.  Plan: I will to follow-up with her in about a month or so if necessary.

## 2020-12-11 ENCOUNTER — Ambulatory Visit: Payer: Medicare HMO | Admitting: Internal Medicine

## 2020-12-26 ENCOUNTER — Ambulatory Visit: Payer: Medicare HMO | Admitting: Physician Assistant

## 2021-01-23 ENCOUNTER — Ambulatory Visit: Payer: Medicare HMO | Admitting: Internal Medicine

## 2021-02-11 ENCOUNTER — Other Ambulatory Visit: Payer: Self-pay

## 2021-02-11 DIAGNOSIS — Z1231 Encounter for screening mammogram for malignant neoplasm of breast: Secondary | ICD-10-CM

## 2021-02-25 ENCOUNTER — Ambulatory Visit
Admission: RE | Admit: 2021-02-25 | Discharge: 2021-02-25 | Disposition: A | Discharge status: Home / Self Care | Payer: Medicare HMO | Source: Ambulatory Visit

## 2021-02-25 ENCOUNTER — Other Ambulatory Visit: Payer: Self-pay

## 2021-02-25 DIAGNOSIS — Z1231 Encounter for screening mammogram for malignant neoplasm of breast: Secondary | ICD-10-CM | POA: Diagnosis not present

## 2021-02-26 ENCOUNTER — Ambulatory Visit: Payer: Medicare HMO | Admitting: Internal Medicine

## 2021-03-04 ENCOUNTER — Other Ambulatory Visit: Payer: Self-pay

## 2021-03-04 DIAGNOSIS — R921 Mammographic calcification found on diagnostic imaging of breast: Secondary | ICD-10-CM

## 2021-03-04 DIAGNOSIS — R928 Other abnormal and inconclusive findings on diagnostic imaging of breast: Secondary | ICD-10-CM

## 2021-03-13 ENCOUNTER — Other Ambulatory Visit: Payer: Self-pay

## 2021-03-13 ENCOUNTER — Ambulatory Visit
Admission: RE | Admit: 2021-03-13 | Discharge: 2021-03-13 | Disposition: A | Discharge status: Home / Self Care | Payer: Medicare HMO | Source: Ambulatory Visit

## 2021-03-13 DIAGNOSIS — R921 Mammographic calcification found on diagnostic imaging of breast: Secondary | ICD-10-CM | POA: Insufficient documentation

## 2021-03-13 DIAGNOSIS — R928 Other abnormal and inconclusive findings on diagnostic imaging of breast: Secondary | ICD-10-CM | POA: Insufficient documentation

## 2021-03-17 ENCOUNTER — Other Ambulatory Visit: Payer: Self-pay

## 2021-03-17 DIAGNOSIS — R921 Mammographic calcification found on diagnostic imaging of breast: Secondary | ICD-10-CM

## 2021-03-17 DIAGNOSIS — R928 Other abnormal and inconclusive findings on diagnostic imaging of breast: Secondary | ICD-10-CM

## 2021-03-24 ENCOUNTER — Ambulatory Visit
Admission: RE | Admit: 2021-03-24 | Discharge: 2021-03-24 | Disposition: A | Discharge status: Home / Self Care | Payer: Medicare HMO | Source: Ambulatory Visit

## 2021-03-24 ENCOUNTER — Other Ambulatory Visit: Payer: Self-pay

## 2021-03-24 DIAGNOSIS — R921 Mammographic calcification found on diagnostic imaging of breast: Secondary | ICD-10-CM

## 2021-03-24 DIAGNOSIS — R928 Other abnormal and inconclusive findings on diagnostic imaging of breast: Secondary | ICD-10-CM | POA: Diagnosis not present

## 2021-03-24 HISTORY — PX: BREAST BIOPSY: SHX20

## 2021-03-25 LAB — SURGICAL PATHOLOGY

## 2021-04-17 ENCOUNTER — Encounter: Payer: Self-pay | Admitting: Dermatology

## 2021-04-17 ENCOUNTER — Other Ambulatory Visit: Payer: Self-pay

## 2021-04-17 ENCOUNTER — Ambulatory Visit: Payer: Medicare HMO | Admitting: Dermatology

## 2021-04-17 DIAGNOSIS — L820 Inflamed seborrheic keratosis: Secondary | ICD-10-CM | POA: Diagnosis not present

## 2021-04-17 DIAGNOSIS — L578 Other skin changes due to chronic exposure to nonionizing radiation: Secondary | ICD-10-CM

## 2021-04-17 DIAGNOSIS — L821 Other seborrheic keratosis: Secondary | ICD-10-CM | POA: Diagnosis not present

## 2021-04-17 DIAGNOSIS — L570 Actinic keratosis: Secondary | ICD-10-CM | POA: Diagnosis not present

## 2021-04-17 DIAGNOSIS — L720 Epidermal cyst: Secondary | ICD-10-CM | POA: Diagnosis not present

## 2021-04-17 NOTE — Patient Instructions (Signed)

## 2021-04-17 NOTE — Progress Notes (Signed)
New Patient Visit  Subjective  Misty Hebert is a 70 y.o. female who presents for the following: Skin Problem (Check irritated rough places on her face that will come and go ).  The following portions of the chart were reviewed this encounter and updated as appropriate:   Tobacco  Allergies  Meds  Problems  Med Hx  Surg Hx  Fam Hx     Review of Systems:  No other skin or systemic complaints except as noted in HPI or Assessment and Plan.  Objective  Well appearing patient in no apparent distress; mood and affect are within normal limits.  A focused examination was performed including face,back,arms. Relevant physical exam findings are noted in the Assessment and Plan.  left cheek x 1, right posterior shoulder x 4, left posterior shoulder x 1, left forearm x 2  (8) (8) Erythematous keratotic or waxy stuck-on papule or plaque.   right brow x 1, right cheek x 1 (2) Erythematous thin papules/macules with gritty scale.   Left Upper Back 1.5 cm Subcutaneous nodule.    Assessment & Plan  Inflamed seborrheic keratosis left cheek x 1, right posterior shoulder x 4, left posterior shoulder x 1, left forearm x 2  (8)  Destruction of lesion - left cheek x 1, right posterior shoulder x 4, left posterior shoulder x 1, left forearm x 2  (8) Complexity: simple   Destruction method: cryotherapy   Informed consent: discussed and consent obtained   Timeout:  patient name, date of birth, surgical site, and procedure verified Lesion destroyed using liquid nitrogen: Yes   Region frozen until ice ball extended beyond lesion: Yes   Outcome: patient tolerated procedure well with no complications   Post-procedure details: wound care instructions given    AK (actinic keratosis) (2) right brow x 1, right cheek x 1  Destruction of lesion - right brow x 1, right cheek x 1 Complexity: simple   Destruction method: cryotherapy   Informed consent: discussed and consent obtained   Timeout:   patient name, date of birth, surgical site, and procedure verified Lesion destroyed using liquid nitrogen: Yes   Region frozen until ice ball extended beyond lesion: Yes   Outcome: patient tolerated procedure well with no complications   Post-procedure details: wound care instructions given    Epidermal cyst Left Upper Back  Benign-appearing. Exam most consistent with an epidermal inclusion cyst. Discussed that a cyst is a benign growth that can grow over time and sometimes get irritated or inflamed. Recommend observation if it is not bothersome. Discussed option of surgical excision to remove it if it is growing, symptomatic, or other changes noted. Please call for new or changing lesions so they can be evaluated.   Seborrheic Keratoses - Stuck-on, waxy, tan-brown papules and/or plaques  - Benign-appearing - Discussed benign etiology and prognosis. - Observe - Call for any changes  Actinic Damage - chronic, secondary to cumulative UV radiation exposure/sun exposure over time - diffuse scaly erythematous macules with underlying dyspigmentation - Recommend daily broad spectrum sunscreen SPF 30+ to sun-exposed areas, reapply every 2 hours as needed.  - Recommend staying in the shade or wearing long sleeves, sun glasses (UVA+UVB protection) and wide brim hats (4-inch brim around the entire circumference of the hat). - Call for new or changing lesions.   Return if symptoms worsen or fail to improve, for cyst surgery, follow up on ISK's at cyst surgery visit .  Marta Lamas, CMA, am acting as scribe  for Sarina Ser, MD .  Documentation: I have reviewed the above documentation for accuracy and completeness, and I agree with the above.  Sarina Ser, MD

## 2021-04-23 ENCOUNTER — Encounter: Payer: Self-pay | Admitting: Internal Medicine

## 2021-04-23 ENCOUNTER — Ambulatory Visit: Payer: Medicare HMO | Admitting: Internal Medicine

## 2021-04-23 ENCOUNTER — Other Ambulatory Visit: Payer: Self-pay

## 2021-04-23 VITALS — BP 150/90 | HR 75 | Ht 63.0 in | Wt 231.0 lb

## 2021-04-23 DIAGNOSIS — E782 Mixed hyperlipidemia: Secondary | ICD-10-CM

## 2021-04-23 DIAGNOSIS — I10 Essential (primary) hypertension: Secondary | ICD-10-CM

## 2021-04-23 DIAGNOSIS — I25119 Atherosclerotic heart disease of native coronary artery with unspecified angina pectoris: Secondary | ICD-10-CM

## 2021-04-23 MED ORDER — LOSARTAN POTASSIUM 50 MG PO TABS: 75.0000 mg | ORAL_TABLET | Freq: Every day | ORAL | 2 refills | Status: DC

## 2021-04-23 MED ORDER — PRAVASTATIN SODIUM 40 MG PO TABS: 40.0000 mg | ORAL_TABLET | Freq: Every evening | ORAL | 3 refills | Status: DC

## 2021-04-23 MED ORDER — PRAVASTATIN SODIUM 40 MG PO TABS: 40.0000 mg | ORAL_TABLET | Freq: Every evening | ORAL | 1 refills | Status: DC

## 2021-04-23 NOTE — Patient Instructions (Addendum)
Medication Instructions:   Your physician has recommended you make the following change in your medication:   STOP Rosuvastatin  START Pravastatin 40 mg daily  INCREASE Losartan 75 mg daily - Take 1.5 tablets of current dose Losartan 50 mg for total of 75 mg daily  *If you need a refill on your cardiac medications before your next appointment, please call your pharmacy*   Lab Work:  Your physician recommends that you return for lab work in: [place] (BMET)   - You do not have to fast for this lab   Testing/Procedures:  None ordered   Follow-Up: At [facility], you and your health needs are our priority.  As part of our continuing mission to provide you with exceptional heart care, we have created designated Provider Care Teams.  These Care Teams include your primary Cardiologist (physician) and Advanced Practice Providers (APPs -  Physician Assistants and Nurse Practitioners) who all work together to provide you with the care you need, when you need it.  We recommend signing up for the patient portal called "MyChart".  Sign up information is provided on this After Visit Summary.  MyChart is used to connect with patients for Virtual Visits (Telemedicine).  Patients are able to view lab/test results, encounter notes, upcoming appointments, etc.  Non-urgent messages can be sent to your provider as well.   To learn more about what you can do with MyChart, go to [email or web address].    Your next appointment:   1 month(s) w/ APP  The format for your next appointment:   In Person  Provider:   You will see one of the following Advanced Practice Providers on your designated Care Team:   Murray Hodgkins, NP Christell Faith, PA-C Marrianne Mood, PA-C Cadence Kathlen Mody, [place]   Other Instructions  Please bring your BP cuff with you to your 1 month follow up appointment.

## 2021-04-23 NOTE — Progress Notes (Signed)
Follow-up Outpatient Visit Date: 04/23/2021  Primary Care Provider: Earlie Counts, FNP No address on file  Chief Complaint: Follow-up chest pain and elevated blood pressure  HPI:  Misty Hebert is a 70 y.o. female with history of chronic intermittent chest pain, hypertension, hyperlipidemia, DVT, GERD, and hiatal hernia, who presents for follow-up of chest pain and hypertension.  She was last seen in our office in March by Laurann Montana, NP, at which time she was under quite a bit of stress related to back and hip pain as well as left foot injury.  She had also recently lost 2 family members.  She had also just completed a course of prednisone, which she implicated causing her elevated blood pressure.  Amlodipine was increased to 5 mg daily with continuation of current doses of HCTZ, losartan, and carvedilol.  Today, Ms. Mckell reports she has been feeling fairly well with only a couple of minor episodes of chest discomfort since her last visit.  The episodes have occurred at rest and only last 2 to 3 minutes before resolving spontaneously.  Her main concern has been about leg swelling since starting amlodipine.  She ultimately stopped the medication after consulting with her PCP with subsequent resolution of the edema.  Her PCP recently increased carvedilol from 3.125 mg twice daily to 6.25 mg twice daily.  Ms. Helbig is tolerating this okay.  Her mobility has been limited due to hip pain for which she has already undergone a couple of procedures.  She also underwent biopsies of 2 breast masses that returned benign.  Her breathing has been stable with mild exertional dyspnea.  She denies palpitations and lightheadedness.  She continues to have some myalgias and wonders if they could be due to rosuvastatin.  She felt better on pravastatin.  --------------------------------------------------------------------------------------------------  Cardiovascular History & Procedures: Cardiovascular  Problems: Atypical chest pain   Risk Factors: Hypertension, hyperlipidemia, family history, and obesity   Cath/PCI: None   CV Surgery: None   EP Procedures and Devices: None   Non-Invasive Evaluation(s): Cardiac CTA (12/28/2019): LMCA normal.  LAD with less than 25% noncalcified stenosis in the proximal vessel.  LCx without significant disease.  Dominant RCA without significant disease, though misregistration in the mid RCA limited evaluation over a short segment.  Aortic atherosclerosis noted. Echocardiogram (01/06/2017, [facility]): LVEF greater than 55% with grade 1 diastolic dysfunction.  Normal RV size and function.  No significant valvular abnormality. Myocardial perfusion stress test (05/05/16): Low risk study with no significant ischemia. Moderate in size, mild in severity, apical and anterior defect at rest is consistent with shifting breast attenuation. LVEF normal (55-65%). Exercise stress echo (01/01/14, [facility]): Normal  Recent CV Pertinent Labs: Lab Results  Component Value Date   INR 1.1 07/14/2012   K 4.6 09/09/2020   K 4.0 10/26/2014   BUN 14 09/09/2020   BUN 13 10/26/2014   CREATININE 0.82 09/09/2020   CREATININE 1.02 (H) 10/26/2014    Past medical and surgical history were reviewed and updated in EPIC.  Current Meds  Medication Sig   carvedilol (COREG) 6.25 MG tablet Take 6.25 mg by mouth 2 (two) times daily with a meal.   dicyclomine (BENTYL) 10 MG capsule Take 10 mg by mouth 2 (two) times daily as needed.   hydrochlorothiazide (HYDRODIURIL) 25 MG tablet Take 1 tablet (25 mg total) by mouth daily.   HYDROcodone-homatropine (HYCODAN) 5-1.5 MG/5ML syrup 5 ml qhs if tessalon perless is not helping   losartan (COZAAR) 50 MG  tablet Take 1 tablet (50 mg total) by mouth daily.   nitroGLYCERIN (NITROSTAT) 0.4 MG SL tablet Place 1 tablet (0.4 mg total) under the tongue every 5 (five) minutes as needed for chest pain (maximum of 3 doses.).   rosuvastatin  (CRESTOR) 5 MG tablet Take 5 mg by mouth daily.   [DISCONTINUED] carvedilol (COREG) 3.125 MG tablet Take 1 tablet (3.125 mg total) by mouth 2 (two) times daily with a meal.    Allergies: Chlorpromazine, Metoclopramide, Sulfamethizole, Amlodipine, Other, Celecoxib, and Meloxicam  Social History   Tobacco Use   Smoking status: Never   Smokeless tobacco: Never  Vaping Use   Vaping Use: Never used  Substance Use Topics   Alcohol use: No   Drug use: No    Family History  Problem Relation Age of Onset   Breast cancer Maternal Aunt 103   Heart attack Mother    Heart disease Mother        pacemaker/ICD   Heart attack Father    Stroke Father    Lung cancer Father    Heart disease Brother        stents in early 50's    Review of Systems: A 12-system review of systems was performed and was negative except as noted in the HPI.  --------------------------------------------------------------------------------------------------  Physical Exam: BP (!) 150/90 (BP Location: Left Arm, Patient Position: Sitting, Cuff Size: Large)   Pulse 75   Ht 5\' 3"  (1.6 m)   Wt 231 lb (104.8 kg)   LMP  (LMP Unknown)   SpO2 98%   BMI 40.92 kg/m  Repeat BP 152/86  General:  NAD. Neck: No gross JVD or HJR, though body habitus limits evaluation. Lungs: Clear to auscultation bilaterally without wheezes or crackles. Heart: Regular rate and rhythm without murmurs, rubs, or gallops. Abdomen: Soft, nontender, nondistended. Extremities: No lower extremity edema.  EKG: Normal sinus rhythm with left axis deviation and borderline LVH.  No significant change since 04/25/2020.  Lab Results  Component Value Date   WBC 7.6 09/09/2020   HGB 16.2 (H) 09/09/2020   HCT 47.7 (H) 09/09/2020   MCV 93 09/09/2020   PLT 188 09/09/2020    Lab Results  Component Value Date   NA 143 09/09/2020   K 4.6 09/09/2020   CL 108 (H) 09/09/2020   CO2 20 09/09/2020   BUN 14 09/09/2020   CREATININE 0.82 09/09/2020    GLUCOSE 147 (H) 09/09/2020   ALT 30 09/09/2020    No results found for: CHOL, HDL, LDLCALC, LDLDIRECT, TRIG, CHOLHDL  --------------------------------------------------------------------------------------------------  ASSESSMENT AND PLAN: Coronary artery disease with atypical chest pain: Ms. Hefley has had minimal chest pain since her last visit with [place].  Prior coronary CTA notable for mild plaquing in the LAD but no significant disease to explain her symptoms.  We will continue with medical therapy to prevent progression of her mild CAD (see details below regarding lipid therapy).  No further work-up recommended at this time.  Hypertension: Blood pressure poorly controlled today.  Ms. Tieken did not tolerate amlodipine due to leg edema and pain.  Recent escalation of carvedilol by her PCP has not adequately controlled blood pressure.  We have agreed to increase losartan to 75 mg daily.  We will plan to repeat a BMP in 2 weeks and have her follow-up with [place] in 1 month to recheck her blood pressure.  She should monitor her blood pressure at home in the meantime and bring her readings with her.  Hyperlipidemia: LDL  suboptimal on last check in 08/2018.  Additionally, Ms. Aerts has not been tolerating rosuvastatin all that well secondary to myalgias.  We have agreed to switch back to pravastatin 40 mg daily,.  She will need a follow-up lipid panel and ALT in about 3 months.  Morbid obesity: BMI remains greater than 40.  Weight loss encouraged through diet and exercise.  Follow-up: Return to clinic in 1 month with APP for blood pressure check.  Nelva Bush, MD 04/23/2021 10:28 AM

## 2021-04-24 ENCOUNTER — Encounter: Payer: Self-pay | Admitting: Podiatry

## 2021-04-24 ENCOUNTER — Ambulatory Visit: Payer: Medicare HMO | Admitting: Podiatry

## 2021-04-24 DIAGNOSIS — M7662 Achilles tendinitis, left leg: Secondary | ICD-10-CM

## 2021-04-24 DIAGNOSIS — M722 Plantar fascial fibromatosis: Secondary | ICD-10-CM

## 2021-04-24 MED ORDER — TRIAMCINOLONE ACETONIDE 40 MG/ML IJ SUSP
20.0000 mg | Freq: Once | INTRAMUSCULAR | Status: AC
  Administered 2021-04-24: 20 mg

## 2021-04-24 MED ORDER — DEXAMETHASONE SODIUM PHOSPHATE 120 MG/30ML IJ SOLN
2.0000 mg | Freq: Once | INTRAMUSCULAR | Status: AC
  Administered 2021-04-24: 2 mg via INTRA_ARTICULAR

## 2021-04-24 MED ORDER — METHYLPREDNISOLONE 4 MG PO TBPK: ORAL_TABLET | ORAL | 0 refills | Status: DC

## 2021-04-24 NOTE — Progress Notes (Signed)
She presents today chief complaint of painful left heel and Achilles tendon area.  States it started bothering her just a few weeks ago is nowhere near as severe as it has been in the past but she states that she would like to get ahead of it if possible.  Objective: Vital signs stable alert oriented x3 there is no erythema edema cellulitis drainage or odor she has tenderness on direct palpation at the posterior tubercle of the calcaneus with the Achilles but the margins of the Achilles do not feel thick nor is there a lot of fluid accumulation in Cager's triangle.  She also has pain on palpation medial calcaneal tubercle.  Assessment: Planter fasciitis and Achilles tendinitis.  Plan: I injected the bursa today of the Achilles tendon area with 2 milligrams of dexamethasone and local anesthetic also injected the plantar fascial area with 20 mg Kenalog 5 mg Marcaine.  We also started her on methylprednisolone and I will follow-up with her in 1 month if necessary.

## 2021-05-07 ENCOUNTER — Other Ambulatory Visit: Payer: Self-pay

## 2021-05-07 ENCOUNTER — Other Ambulatory Visit (INDEPENDENT_AMBULATORY_CARE_PROVIDER_SITE_OTHER): Payer: Medicare HMO

## 2021-05-07 DIAGNOSIS — I25119 Atherosclerotic heart disease of native coronary artery with unspecified angina pectoris: Secondary | ICD-10-CM

## 2021-05-07 DIAGNOSIS — I10 Essential (primary) hypertension: Secondary | ICD-10-CM | POA: Diagnosis not present

## 2021-05-08 LAB — BASIC METABOLIC PANEL
BUN/Creatinine Ratio: 13 (ref 12–28)
BUN: 11 mg/dL (ref 8–27)
CO2: 21 mmol/L (ref 20–29)
Calcium: 9.2 mg/dL (ref 8.7–10.3)
Chloride: 106 mmol/L (ref 96–106)
Creatinine, Ser: 0.83 mg/dL (ref 0.57–1.00)
Glucose: 108 mg/dL — ABNORMAL HIGH (ref 70–99)
Potassium: 5.1 mmol/L (ref 3.5–5.2)
Sodium: 141 mmol/L (ref 134–144)
eGFR: 76 mL/min/{1.73_m2} (ref >59)

## 2021-05-14 ENCOUNTER — Ambulatory Visit: Payer: Medicare HMO | Admitting: Podiatry

## 2021-05-26 NOTE — Progress Notes (Deleted)
Cardiology Office Note    Date:  05/26/2021   ID:  Misty Hebert, DOB Oct 16, 1950, MRN [id number]  PCP:  Earlie Counts, FNP  Cardiologist:  Nelva Bush, MD  Electrophysiologist:  None   Chief Complaint: Follow-up  History of Present Illness:   Misty Hebert is a 70 y.o. female with history of nonobstructive CAD, HTN, HLD, DVT, COVID in 10/2020, intermittent chest pain, hiatal hernia, and GERD who presents for follow-up of hypertension.  Coronary CTA in 12/2019, in the context of episodic sharp and stabbing chest pain lasting 5 to 10 minutes in duration, showed a calcium score of 0 with minimal nonobstructive CAD.  In follow-up, she was started on amlodipine for improved blood pressure control and to potentially treat coronary vasospasm component of her initial chest discomfort.  She was last seen in the office in 04/2021 and was doing fairly well from a cardiac component, noting only minor episodes of chest discomfort since her last visit, and would last 2 to 3 minutes before spontaneous resolution.  Her main concern was lower extremity swelling that began after initiating eating to the ultimate discontinuation of this medication after consultation with your PCP and subsequent resolution of swelling.  With this, carvedilol was initiated and subsequently titrated.  She also noted myalgias and wondered if this was related to rosuvastatin.  She reported feeling better while on pravastatin.  Her BP was elevated at 150/90.  Losartan was titrated to 75 mg daily and she was continued on current dose carvedilol.  With regards to her hyperlipidemia and myalgias, she was changed back to pravastatin 40 mg.  ***   Labs independently reviewed: 05/2021 - BUN 11, serum creatinine 0.83, potassium 5.1 02/2021 - Hgb 15.0, PLT 231, albumin 4.0, AST/ALT normal 08/2020 - TSH normal, TC 219, TG 85, HDL 67, LDL 125  Past Medical History:  Diagnosis Date   Arthritis of knee    Benign essential HTN     Chest pain    a. she reports 4 normal heart caths; b. 12/2013 St [facility]: nl Jefm Bryant); c. 04/2016 MV: apical and ant defect @ rest consistent w/ breast attenuation. No ischemia. EF 55-65%.   Depression    Diverticulitis of intestine w/o perforation or abscess w/o bleeding    DVT (deep venous thrombosis) (HCC)    Dysphagia    Fibromyalgia    High cholesterol    History of colon resection    History of echocardiogram    a. 01/2017 Echo ([facility]): nl LV syst fxn.   Hyperlipidemia    Medial meniscus tear    Migraine    Neuromuscular disorder (HCC)    Obesity    Overactive bladder    Reflux    Subchondral insufficiency fracture of condyle of femur (HCC)    Tension headache     Past Surgical History:  Procedure Laterality Date   ABDOMINAL HYSTERECTOMY     BREAST BIOPSY Right    CYST REMOVED-16-38YRS AGO-NEG   BREAST BIOPSY Left 03/24/2021   stereo bx of calcs UOQ anterior #1, coil marker, path pending   BREAST BIOPSY Left 03/24/2021   stereo bx calcs UOQ posterior #2, x marker, path pending   [place] [facility]   CARDIAC CATHETERIZATION  2006   [place] with Dr. Nehemiah Massed    COLON RESECTION     FOOT SURGERY  Left 01/10/2016   03/06/16 second foot surgery   KNEE SURGERY     REVISION TOTAL HIP ARTHROPLASTY     June 2019   SHOULDER SURGERY      Current Medications: No outpatient medications have been marked as taking for the 05/27/21 encounter (Appointment) with Rise Mu, PA-C.    Allergies:   Chlorpromazine, Metoclopramide, Sulfamethizole, Amlodipine, Other, Celecoxib, and Meloxicam   Social History   Socioeconomic History   Marital status: Married    Spouse name: Not on file   Number of children: Not on file   Years of education: Not on file   Highest education level: Not on file  Occupational History   Not on file  Tobacco Use   Smoking status: Never    Smokeless tobacco: Never  Vaping Use   Vaping Use: Never used  Substance and Sexual Activity   Alcohol use: No   Drug use: No   Sexual activity: Not on file  Other Topics Concern   Not on file  Social History Narrative   Not on file   Social Determinants of Health   Financial Resource Strain: Not on file  Food Insecurity: Not on file  Transportation Needs: Not on file  Physical Activity: Not on file  Stress: Not on file  Social Connections: Not on file     Family History:  The patient's family history includes Breast cancer (age of onset: 58) in her maternal aunt; Heart attack in her father and mother; Heart disease in her brother and mother; Lung cancer in her father; Stroke in her father.  ROS:   ROS   EKGs/Labs/Other Studies Reviewed:    Studies reviewed were summarized above. The additional studies were reviewed today:  Coronary CTA 12/2019: Quality: Fair, HR 58, motion artifact/mis-registration artifact   Coronary calcium score: The patient's coronary artery calcium score is 0, which places the patient in the 0 percentile.   Coronary arteries: Normal coronary origins.  Right dominance.   Right Coronary Artery: Dominant. Mis-registration artifact of a small mid-vessel segment which could not be interpreted, otherwise, no significant stenosis.   Left Main Coronary Artery: Normal. Bifurcates into the LAD and LCx arteries.   Left Anterior Descending Coronary Artery: Large vessel, extends to the apex. Minimal proximal 1-24% non-calcified stenosis (CADRADS1) at the D1 bifurcation. Moderate proximal D1 vessel without significant stenosis.   Left Circumflex Artery: AV groove vessel without stenosis. High OM1 takeoff and moderate-sized OM2 in the mid-vessel. Neither vessel has significant stenosis.   Aorta: Normal size, 29 mm at the mid ascending aorta (level of the PA bifurcation) measured double oblique. Aortic atherosclerosis. No dissection.   Aortic  Valve: Trileaflet.  Annular calcification.   Other findings:   Normal pulmonary vein drainage into the left atrium.   Normal left atrial appendage without a thrombus.   Normal size of the pulmonary artery.   IMPRESSION: 1. Minimal non-obstructive CAD, CADRADS = 1. 2. Coronary calcium score of 0. This was 0 percentile for age and sex matched control. 3. Normal coronary origin with right dominance. 4. Aortic atherosclerosis. __________  2D echo 01/2017 [facility]): LVEF greater than 64%, grade 1 diastolic dysfunction, normal RV systolic function and ventricular cavity size, no significant valvular abnormalities __________  Nuclear stress test 04/2016: No significant ischemia. Moderate in size, mild in severity apical, anterior, anteroseptal defect resolves with stress, consistent with shifting breast attenuation. The left ventricular ejection fraction is normal (55-65%). This is a low risk study. __________  Exercise  stress echo 12/2013 [facility]): Normal    EKG:  EKG is ordered today.  The EKG ordered today demonstrates ***  Recent Labs: 09/09/2020: ALT 30; Hemoglobin 16.2; Platelets 188 05/07/2021: BUN 11; Creatinine, Ser 0.83; Potassium 5.1; Sodium 141  Recent Lipid Panel No results found for: CHOL, TRIG, HDL, CHOLHDL, VLDL, LDLCALC, LDLDIRECT  PHYSICAL EXAM:    VS:  LMP  (LMP Unknown)   BMI: There is no height or weight on file to calculate BMI.  Physical Exam  Wt Readings from Last 3 Encounters:  04/23/21 231 lb (104.8 kg)  10/24/20 225 lb (102.1 kg)  09/09/20 229 lb (103.9 kg)     ASSESSMENT & PLAN:   Nonobstructive CAD with atypical chest pain:  HTN: Blood pressure ***  HLD: LDL 125 in 08/2020.  At her last visit, she was transition from rosuvastatin back to pravastatin 40 mg secondary to myalgias.  Morbid obesity:   {Are you ordering a CV Procedure (e.g. stress test, cath, DCCV, TEE, etc)?   Press F2        :[id number]}     Disposition:  F/u with Dr. Saunders Revel or an APP in ***.   Medication Adjustments/Labs and Tests Ordered: Current medicines are reviewed at length with the patient today.  Concerns regarding medicines are outlined above. Medication changes, Labs and Tests ordered today are summarized above and listed in the Patient Instructions accessible in Encounters.   Signed, Christell Faith, PA-C 05/26/2021 9:47 AM     [facility] [address] [place] [address] [place], [place] [address] [phone]

## 2021-05-27 ENCOUNTER — Ambulatory Visit: Payer: Medicare HMO | Admitting: Physician Assistant

## 2021-05-28 ENCOUNTER — Encounter: Payer: Self-pay | Admitting: Physician Assistant

## 2021-06-10 ENCOUNTER — Other Ambulatory Visit: Payer: Self-pay

## 2021-06-10 ENCOUNTER — Ambulatory Visit: Payer: Medicare HMO | Admitting: Dermatology

## 2021-06-10 ENCOUNTER — Telehealth: Payer: Self-pay

## 2021-06-10 DIAGNOSIS — D492 Neoplasm of unspecified behavior of bone, soft tissue, and skin: Secondary | ICD-10-CM

## 2021-06-10 DIAGNOSIS — L720 Epidermal cyst: Secondary | ICD-10-CM | POA: Diagnosis not present

## 2021-06-10 NOTE — Progress Notes (Signed)
   Follow-Up Visit   Subjective  Misty Hebert is a 70 y.o. female who presents for the following: Cyst - symptomatic and growing.= -(L upper back, pt presents for excision).  The following portions of the chart were reviewed this encounter and updated as appropriate:   Tobacco  Allergies  Meds  Problems  Med Hx  Surg Hx  Fam Hx     Review of Systems:  No other skin or systemic complaints except as noted in HPI or Assessment and Plan.  Objective  Well appearing patient in no apparent distress; mood and affect are within normal limits.  A focused examination was performed including back. Relevant physical exam findings are noted in the Assessment and Plan.  Left Upper Back Cystic pap 2.2   Assessment & Plan  Neoplasm of skin Left Upper Back  Skin excision  Lesion length (cm):  2.2 Lesion width (cm):  2.2 Margin per side (cm):  0 Total excision diameter (cm):  2.2 Informed consent: discussed and consent obtained   Timeout: patient name, date of birth, surgical site, and procedure verified   Procedure prep:  Patient was prepped and draped in usual sterile fashion Prep type:  Isopropyl alcohol and povidone-iodine Anesthesia: the lesion was anesthetized in a standard fashion   Anesthetic:  1% lidocaine w/ epinephrine 1-100,000 buffered w/ 8.4% NaHCO3 (6cc lido w/ epi, 6cc bupivicaine, Total = 12cc) Instrument used: #15 blade   Hemostasis achieved with: suture and pressure   Hemostasis achieved with comment:  Electrocautery Outcome: patient tolerated procedure well with no complications   Post-procedure details: sterile dressing applied and wound care instructions given   Dressing type: bandage and pressure dressing (Mupirocin)    Skin repair Complexity:  Complex Final length (cm):  3 Reason for type of repair: reduce tension to allow closure, reduce the risk of dehiscence, infection, and necrosis, reduce subcutaneous dead space and avoid a hematoma, allow closure of  the large defect, preserve normal anatomy, preserve normal anatomical and functional relationships and enhance both functionality and cosmetic results   Undermining: area extensively undermined   Undermining comment:  Undermining Defect 2.2cm Subcutaneous layers (deep stitches):  Suture size:  2-0 Suture type: Vicryl (polyglactin 910)   Subcutaneous suture technique: Inverted Dermal. Fine/surface layer approximation (top stitches):  Suture size:  2-0 Suture type: nylon   Stitches: horizontal mattress   Suture removal (days):  7 Hemostasis achieved with: pressure Outcome: patient tolerated procedure well with no complications   Post-procedure details: sterile dressing applied and wound care instructions given   Dressing type: bandage, pressure dressing and bacitracin (Mupirocin)    Specimen 1 - Surgical pathology Differential Diagnosis: D48.5 Cyst vs other  Check Margins: No Cystic pap  Cyst vs other  Start Mupirocin oint qd to excision site, pt has at home  Return in about 1 week (around 06/17/2021) for suture removal and TBSE.  I, Othelia Pulling, RMA, am acting as scribe for Sarina Ser, MD . Documentation: I have reviewed the above documentation for accuracy and completeness, and I agree with the above.  Sarina Ser, MD

## 2021-06-10 NOTE — Patient Instructions (Signed)

## 2021-06-10 NOTE — Telephone Encounter (Signed)
Pt doing fine after today's surgery./sh 

## 2021-06-17 ENCOUNTER — Other Ambulatory Visit: Payer: Self-pay

## 2021-06-17 ENCOUNTER — Ambulatory Visit: Payer: Medicare HMO | Admitting: Dermatology

## 2021-06-17 ENCOUNTER — Encounter: Payer: Self-pay | Admitting: Dermatology

## 2021-06-17 DIAGNOSIS — L814 Other melanin hyperpigmentation: Secondary | ICD-10-CM

## 2021-06-17 DIAGNOSIS — Z1283 Encounter for screening for malignant neoplasm of skin: Secondary | ICD-10-CM | POA: Diagnosis not present

## 2021-06-17 DIAGNOSIS — L820 Inflamed seborrheic keratosis: Secondary | ICD-10-CM

## 2021-06-17 DIAGNOSIS — L578 Other skin changes due to chronic exposure to nonionizing radiation: Secondary | ICD-10-CM

## 2021-06-17 DIAGNOSIS — Z4802 Encounter for removal of sutures: Secondary | ICD-10-CM

## 2021-06-17 DIAGNOSIS — L738 Other specified follicular disorders: Secondary | ICD-10-CM

## 2021-06-17 DIAGNOSIS — D1800 Hemangioma unspecified site: Secondary | ICD-10-CM

## 2021-06-17 DIAGNOSIS — L719 Rosacea, unspecified: Secondary | ICD-10-CM

## 2021-06-17 DIAGNOSIS — D229 Melanocytic nevi, unspecified: Secondary | ICD-10-CM

## 2021-06-17 DIAGNOSIS — L720 Epidermal cyst: Secondary | ICD-10-CM

## 2021-06-17 DIAGNOSIS — L821 Other seborrheic keratosis: Secondary | ICD-10-CM

## 2021-06-17 NOTE — Patient Instructions (Addendum)
If You Need Anything After Your Visit  If you have any questions or concerns for your doctor, please call our main line at [phone] and press option 4 to reach your doctor's medical assistant. If no one answers, please leave a voicemail as directed and we will return your call as soon as possible. Messages left after 4 pm will be answered the following business day.   You may also send us a message via MyChart. We typically respond to MyChart messages within 1-2 business days.  For prescription refills, please ask your pharmacy to contact our office. Our fax number is [phone].  If you have an urgent issue when the clinic is closed that cannot wait until the next business day, you can page your doctor at the number below.    Please note that while we do our best to be available for urgent issues outside of office hours, we are not available 24/7.   If you have an urgent issue and are unable to reach us, you may choose to seek medical care at your doctor's office, retail clinic, urgent care center, or emergency room.  If you have a medical emergency, please immediately call 911 or go to the emergency department.  Pager Numbers  - Dr. Kowalski: [phone]  - Dr. Moye: [phone]  - Dr. Stewart: [phone]  In the event of inclement weather, please call our main line at [phone] for an update on the status of any delays or closures.  Dermatology Medication Tips: Please keep the boxes that topical medications come in in order to help keep track of the instructions about where and how to use these. Pharmacies typically print the medication instructions only on the boxes and not directly on the medication tubes.   If your medication is too expensive, please contact our office at [phone] option 4 or send us a message through MyChart.   We are unable to tell what your co-pay for medications will be in advance as this is different depending on your insurance coverage.  However, we may be able to find a substitute medication at lower cost or fill out paperwork to get insurance to cover a needed medication.   If a prior authorization is required to get your medication covered by your insurance company, please allow us 1-2 business days to complete this process.  Drug prices often vary depending on where the prescription is filled and some pharmacies may offer cheaper prices.  The website www.goodrx.com contains coupons for medications through different pharmacies. The prices here do not account for what the cost may be with help from insurance (it may be cheaper with your insurance), but the website can give you the price if you did not use any insurance.  - You can print the associated coupon and take it with your prescription to the pharmacy.  - You may also stop by our office during regular business hours and pick up a GoodRx coupon card.  - If you need your prescription sent electronically to a different pharmacy, notify our office through [place] MyChart or by phone at [phone] option 4.     Si Usted Necesita Algo Despus de Su Visita  Tambin puede enviarnos un mensaje a travs de MyChart. Por lo general respondemos a los mensajes de MyChart en el transcurso de 1 a 2 das hbiles.  Para renovar recetas, por favor pida a su farmacia que se ponga en contacto con nuestra oficina. Nuestro nmero de fax es el [phone].  Si tiene   un asunto urgente cuando la clnica est cerrada y que no puede esperar hasta el siguiente da hbil, puede llamar/localizar a su doctor(a) al nmero que aparece a continuacin.   Por favor, tenga en cuenta que aunque hacemos todo lo posible para estar disponibles para asuntos urgentes fuera del horario de oficina, no estamos disponibles las 24 horas del da, los 7 das de la semana.   Si tiene un problema urgente y no puede comunicarse con nosotros, puede optar por buscar atencin mdica  en el consultorio de su  doctor(a), en una clnica privada, en un centro de atencin urgente o en una sala de emergencias.  Si tiene una emergencia mdica, por favor llame inmediatamente al 911 o vaya a la sala de emergencias.  Nmeros de bper  - Dr. Kowalski: [phone]  - Dra. Moye: [phone]  - Dra. Stewart: [phone]  En caso de inclemencias del tiempo, por favor llame a nuestra lnea principal al [phone] para una actualizacin sobre el estado de cualquier retraso o cierre.  Consejos para la medicacin en dermatologa: Por favor, guarde las cajas en las que vienen los medicamentos de uso tpico para ayudarle a seguir las instrucciones sobre dnde y cmo usarlos. Las farmacias generalmente imprimen las instrucciones del medicamento slo en las cajas y no directamente en los tubos del medicamento.   Si su medicamento es muy caro, por favor, pngase en contacto con nuestra oficina llamando al [phone] y presione la opcin 4 o envenos un mensaje a travs de MyChart.   No podemos decirle cul ser su copago por los medicamentos por adelantado ya que esto es diferente dependiendo de la cobertura de su seguro. Sin embargo, es posible que podamos encontrar un medicamento sustituto a menor costo o llenar un formulario para que el seguro cubra el medicamento que se considera necesario.   Si se requiere una autorizacin previa para que su compaa de seguros cubra su medicamento, por favor permtanos de 1 a 2 das hbiles para completar este proceso.  Los precios de los medicamentos varan con frecuencia dependiendo del lugar de dnde se surte la receta y alguna farmacias pueden ofrecer precios ms baratos.  El sitio web www.goodrx.com tiene cupones para medicamentos de diferentes farmacias. Los precios aqu no tienen en cuenta lo que podra costar con la ayuda del seguro (puede ser ms barato con su seguro), pero el sitio web puede darle el precio si no utiliz ningn seguro.  - Puede imprimir el cupn  correspondiente y llevarlo con su receta a la farmacia.  - Tambin puede pasar por nuestra oficina durante el horario de atencin regular y recoger una tarjeta de cupones de GoodRx.  - Si necesita que su receta se enve electrnicamente a una farmacia diferente, informe a nuestra oficina a travs de MyChart de [place] o por telfono llamando al [phone] y presione la opcin 4.   Cryotherapy Aftercare  Wash gently with soap and water everyday.   Apply Vaseline and Band-Aid daily until healed.  

## 2021-06-17 NOTE — Progress Notes (Signed)
Follow-Up Visit   Subjective  Misty Hebert is a 70 y.o. female who presents for the following: Cyst bx proven (L upper back, 1 wk post op, pt presents for suture removal) and Total body skin exam (Hx of AKs). The patient presents for Total-Body Skin Exam (TBSE) for skin cancer screening and mole check.  The patient has spots, moles and lesions to be evaluated, some may be new or changing and the patient has concerns that these could be cancer.   The following portions of the chart were reviewed this encounter and updated as appropriate:   Tobacco   Allergies   Meds   Problems   Med Hx   Surg Hx   Fam Hx      Review of Systems:  No other skin or systemic complaints except as noted in HPI or Assessment and Plan.  Objective  Well appearing patient in no apparent distress; mood and affect are within normal limits.  A full examination was performed including scalp, head, eyes, ears, nose, lips, neck, chest, axillae, abdomen, back, buttocks, bilateral upper extremities, bilateral lower extremities, hands, feet, fingers, toes, fingernails, and toenails. All findings within normal limits unless otherwise noted below.  Left upper back Healing excision site  face Telangiectasias and erythema cheeks, nose  L forearm x 5, L popliteal fossa x 1, Total = 6 (6) Erythematous keratotic or waxy stuck-on papule or plaque.    Assessment & Plan   Lentigines - Scattered tan macules - Due to sun exposure - Benign-appearing, observe - Recommend daily broad spectrum sunscreen SPF 30+ to sun-exposed areas, reapply every 2 hours as needed. - Call for any changes  Seborrheic Keratoses - Stuck-on, waxy, tan-brown papules and/or plaques  - Benign-appearing - Discussed benign etiology and prognosis. - Observe - Call for any changes  Melanocytic Nevi - Tan-brown and/or pink-flesh-colored symmetric macules and papules - Benign appearing on exam today - Observation - Call clinic for new or  changing moles - Recommend daily use of broad spectrum spf 30+ sunscreen to sun-exposed areas.   Hemangiomas - Red papules - Discussed benign nature - Observe - Call for any changes  Actinic Damage - Chronic condition, secondary to cumulative UV/sun exposure - diffuse scaly erythematous macules with underlying dyspigmentation - Recommend daily broad spectrum sunscreen SPF 30+ to sun-exposed areas, reapply every 2 hours as needed.  - Staying in the shade or wearing long sleeves, sun glasses (UVA+UVB protection) and wide brim hats (4-inch brim around the entire circumference of the hat) are also recommended for sun protection.  - Call for new or changing lesions.  Skin cancer screening performed today.  Sebaceous Hyperplasia - Small yellow papules with a central dell - Benign - Observe   Milia - tiny firm white papules - type of cyst - benign - may be extracted if symptomatic - observe   Epidermal cyst Left upper back  Bx proven  Encounter for Removal of Sutures - Incision site at the left upper back is clean, dry and intact - Wound cleansed, sutures removed, wound cleansed and steri strips applied.  - Discussed pathology results showing epidermal cyst  - Patient advised to keep steri-strips dry until they fall off. - Scars remodel for a full year. - Once steri-strips fall off, patient can apply over-the-counter silicone scar cream each night to help with scar remodeling if desired. - Patient advised to call with any concerns or if they notice any new or changing lesions.    Rosacea face  Rosacea is a chronic progressive skin condition usually affecting the face of adults, causing redness and/or acne bumps. It is treatable but not curable. It sometimes affects the eyes (ocular rosacea) as well. It may respond to topical and/or systemic medication and can flare with stress, sun exposure, alcohol, exercise and some foods.  Daily application of broad spectrum spf 30+  sunscreen to face is recommended to reduce flares.  Discussed the treatment option of BBL/laser.  Typically we recommend 1-3 treatment sessions about 5-8 weeks apart for best results.  The patient's condition may require "maintenance treatments" in the future.  The fee for BBL / laser treatments is $350 per treatment session for the whole face.  A fee can be quoted for other parts of the body. Insurance typically does not pay for BBL/laser treatments and therefore the fee is an out-of-pocket cost.   Inflamed seborrheic keratosis L forearm x 5, L popliteal fossa x 1, Total = 6  Destruction of lesion - L forearm x 5, L popliteal fossa x 1, Total = 6 Complexity: simple   Destruction method: cryotherapy   Informed consent: discussed and consent obtained   Timeout:  patient name, date of birth, surgical site, and procedure verified Lesion destroyed using liquid nitrogen: Yes   Region frozen until ice ball extended beyond lesion: Yes   Outcome: patient tolerated procedure well with no complications   Post-procedure details: wound care instructions given    Skin cancer screening   Return in about 1 year (around 06/17/2022) for TBSE, Hx of AKs.  I, Othelia Pulling, RMA, am acting as scribe for Sarina Ser, MD . Documentation: I have reviewed the above documentation for accuracy and completeness, and I agree with the above.  Sarina Ser, MD

## 2021-06-20 ENCOUNTER — Encounter: Payer: Self-pay | Admitting: Dermatology

## 2021-06-22 ENCOUNTER — Encounter: Payer: Self-pay | Admitting: Dermatology

## 2021-08-09 ENCOUNTER — Ambulatory Visit
Admission: EM | Admit: 2021-08-09 | Discharge: 2021-08-09 | Disposition: A | Discharge status: Home / Self Care | Payer: Medicare HMO

## 2021-08-09 ENCOUNTER — Other Ambulatory Visit: Payer: Self-pay

## 2021-08-09 ENCOUNTER — Encounter: Payer: Self-pay | Admitting: Emergency Medicine

## 2021-08-09 DIAGNOSIS — R051 Acute cough: Secondary | ICD-10-CM

## 2021-08-09 DIAGNOSIS — H66003 Acute suppurative otitis media without spontaneous rupture of ear drum, bilateral: Secondary | ICD-10-CM

## 2021-08-09 DIAGNOSIS — J0140 Acute pansinusitis, unspecified: Secondary | ICD-10-CM

## 2021-08-09 MED ORDER — BENZONATATE 200 MG PO CAPS: 200.0000 mg | ORAL_CAPSULE | Freq: Three times a day (TID) | ORAL | 0 refills | Status: DC | PRN

## 2021-08-09 MED ORDER — FLUTICASONE PROPIONATE 50 MCG/ACT NA SUSP: 2.0000 | Freq: Every day | NASAL | 0 refills | Status: AC

## 2021-08-09 MED ORDER — AMOXICILLIN-POT CLAVULANATE 875-125 MG PO TABS: 1.0000 | ORAL_TABLET | Freq: Two times a day (BID) | ORAL | 0 refills | Status: DC

## 2021-08-09 NOTE — Discharge Instructions (Addendum)
Restart your medications

## 2021-08-09 NOTE — ED Provider Notes (Signed)
[facility]    CSN: [id number] Arrival date & time: 08/09/21  1131      History   Chief Complaint Chief Complaint  Patient presents with   Cough    HPI Misty Hebert is a 71 y.o. female.   HPI  Cough: Patient states that she has had nasal congestion and a cough for the past 3 weeks.  Feels a bit like bronchitis.  Over the past week she has not been taking her blood pressure medicine or her acid reflux medicine and her symptoms have definitely worsened.  She also now is having worse sinus congestion and this is developed into bilateral ear pain.  She denies any significant shortness of breath, chest pain or fevers.  She has tried Coricidin, Mucinex, for symptoms  Past Medical History:  Diagnosis Date   Actinic keratosis    Arthritis of knee    Benign essential HTN    Chest pain    a. she reports 4 normal heart caths; b. 12/2013 St Echo: nl Jefm Bryant); c. 04/2016 MV: apical and ant defect @ rest consistent w/ breast attenuation. No ischemia. EF 55-65%.   Depression    Diverticulitis of intestine w/o perforation or abscess w/o bleeding    DVT (deep venous thrombosis) (HCC)    Dysphagia    Fibromyalgia    High cholesterol    History of colon resection    History of echocardiogram    a. 01/2017 Echo ([facility]): nl LV syst fxn.   Hyperlipidemia    Medial meniscus tear    Migraine    Neuromuscular disorder (HCC)    Obesity    Overactive bladder    Reflux    Subchondral insufficiency fracture of condyle of femur (HCC)    Tension headache     Patient Active Problem List   Diagnosis Date Noted   Chronic heart failure with preserved ejection fraction (HFpEF) ([place]) 01/17/2020   Dermatochalasis of both upper eyelids 08/08/2018   Ptosis, both eyelids 08/08/2018   Visual field defect 08/08/2018   Leukocytosis 06/07/2018   Low back pain 06/07/2018   Morbid obesity ([place]) 06/07/2018   Osteoarthritis of right hip 06/07/2018   Primary localized osteoarthritis of pelvic  region and thigh 06/07/2018   On long term drug therapy 02/24/2018   History of total hip arthroplasty 01/04/2018   Carpal tunnel syndrome 11/17/2017   Mild persistent asthma without complication 58/03/9832   Dyspnea 11/23/2016   Herpes simplex infection 11/23/2016   Sore throat 11/23/2016   Atypical chest pain 07/30/2016   Essential hypertension 07/30/2016   Morbid obesity with BMI of 40.0-44.9, adult ([place]) 07/30/2016   Pain in right knee 07/29/2016   Gastroesophageal reflux disease 04/23/2016   History of esophageal ulcer 04/23/2016   Arthritis of knee 10/04/2013   S/P colon resection 07/25/2013   Diverticulitis of intestine w/o perforation or abscess w/o bleeding 07/26/2012   DVT (deep venous thrombosis) ([place]) 07/26/2012   Neuromuscular disorder ([place]) 04/12/2012   Hip pain 01/14/2012   Trochanteric bursitis of left hip 12/22/2011   Depression 04/06/2011   Fibromyalgia 04/06/2011   Mixed hyperlipidemia 04/06/2011   Migraine 04/06/2011   Overactive bladder 04/06/2011   Tension type headache 04/06/2011    Past Surgical History:  Procedure Laterality Date   ABDOMINAL HYSTERECTOMY     BREAST BIOPSY Right    CYST REMOVED-16-64YRS AGO-NEG   BREAST BIOPSY Left 03/24/2021   stereo bx of calcs UOQ anterior #1, coil marker, path pending   BREAST BIOPSY  Left 03/24/2021   stereo bx calcs UOQ posterior #2, x marker, path pending   [place] [facility]   CARDIAC CATHETERIZATION  2006   [place] with Dr. Nehemiah Massed    COLON RESECTION     FOOT SURGERY Left 01/10/2016   03/06/16 second foot surgery   KNEE SURGERY     REVISION TOTAL HIP ARTHROPLASTY     June 2019   SHOULDER SURGERY      OB History   No obstetric history on file.      Home Medications    Prior to Admission medications   Medication Sig Start Date End Date Taking? Authorizing Provider  carvedilol  (COREG) 6.25 MG tablet Take 6.25 mg by mouth 2 (two) times daily with a meal.   Yes [provider]  dicyclomine (BENTYL) 10 MG capsule Take 10 mg by mouth 2 (two) times daily as needed. 08/19/20  Yes [provider]  FLUoxetine (PROZAC) 10 MG capsule Take by mouth. 07/14/21 07/14/22 Yes [provider]  losartan (COZAAR) 50 MG tablet Take 1.5 tablets (75 mg total) by mouth daily. 04/23/21  Yes End, Harrell Gave, MD  pravastatin (PRAVACHOL) 40 MG tablet Take 1 tablet (40 mg total) by mouth every evening. 04/23/21 04/18/22 Yes End, Harrell Gave, MD  hydrochlorothiazide (HYDRODIURIL) 25 MG tablet Take 1 tablet (25 mg total) by mouth daily. 04/25/20   Loel Dubonnet, NP  HYDROcodone-homatropine [facility]) 5-1.5 MG/5ML syrup 5 ml qhs if tessalon perless is not helping 10/24/20   Rodriguez-Southworth, Sunday Spillers, PA-C  methylPREDNISolone (MEDROL DOSEPAK) 4 MG TBPK tablet 6 day dose pack - take as directed 04/24/21   Hyatt, Max T, DPM  nitroGLYCERIN (NITROSTAT) 0.4 MG SL tablet Place 1 tablet (0.4 mg total) under the tongue every 5 (five) minutes as needed for chest pain (maximum of 3 doses.). 01/17/20   End, Harrell Gave, MD    Family History Family History  Problem Relation Age of Onset   Breast cancer Maternal Aunt 26   Heart attack Mother    Heart disease Mother        pacemaker/ICD   Heart attack Father    Stroke Father    Lung cancer Father    Heart disease Brother        stents in early 51's    Social History Social History   Tobacco Use   Smoking status: Never   Smokeless tobacco: Never  Vaping Use   Vaping Use: Never used  Substance Use Topics   Alcohol use: No   Drug use: No     Allergies   Chlorpromazine, Metoclopramide, Sulfamethizole, Amlodipine, Other, Celecoxib, and Meloxicam   Review of Systems Review of Systems  As stated above in HPI Physical Exam Triage Vital Signs ED Triage Vitals  Enc Vitals Group     BP 08/09/21 1220 (!) 163/83      Pulse Rate 08/09/21 1220 72     Resp 08/09/21 1220 14     Temp 08/09/21 1220 97.9 F (36.6 C)     Temp Source 08/09/21 1220 Oral     SpO2 08/09/21 1220 99 %     Weight 08/09/21 1216 230 lb (104.3 kg)     Height 08/09/21 1216 5\' 3"  (1.6 m)     Head Circumference --      Peak Flow --      Pain  Score 08/09/21 1216 5     Pain Loc --      Pain Edu? --      Excl. in [place]? --    No data found.  Updated Vital Signs BP (!) 163/83 (BP Location: Left Arm)    Pulse 72    Temp 97.9 F (36.6 C) (Oral)    Resp 14    Ht 5\' 3"  (1.6 m)    Wt 230 lb (104.3 kg)    LMP  (LMP Unknown)    SpO2 99%    BMI 40.74 kg/m   Physical Exam Vitals and nursing note reviewed.  Constitutional:      General: She is not in acute distress.    Appearance: Normal appearance. She is not ill-appearing, toxic-appearing or diaphoretic.  HENT:     Head: Normocephalic and atraumatic.     Ears:     Comments: TMS with erythema and edema bilaterally     Nose: Congestion and rhinorrhea present.     Mouth/Throat:     Mouth: Mucous membranes are moist.     Pharynx: Oropharynx is clear. No oropharyngeal exudate or posterior oropharyngeal erythema.  Eyes:     General:        Right eye: No discharge.        Left eye: No discharge.     Extraocular Movements: Extraocular movements intact.     Conjunctiva/sclera: Conjunctivae normal.     Pupils: Pupils are equal, round, and reactive to light.  Cardiovascular:     Rate and Rhythm: Normal rate and regular rhythm.     Heart sounds: Normal heart sounds.  Pulmonary:     Effort: Pulmonary effort is normal. No respiratory distress.     Breath sounds: Normal breath sounds. No stridor. No wheezing or rhonchi.  Abdominal:     Palpations: Abdomen is soft.  Musculoskeletal:     Cervical back: Normal range of motion and neck supple.  Lymphadenopathy:     Cervical: No cervical adenopathy.  Skin:    General: Skin is warm.     Coloration: Skin is not jaundiced.     Findings: No  erythema or rash.  Neurological:     Mental Status: She is alert and oriented to person, place, and time.     UC Treatments / Results  Labs (all labs ordered are listed, but only abnormal results are displayed) Labs Reviewed - No data to display  EKG   Radiology No results found.  Procedures Procedures (including critical care time)  Medications Ordered in UC Medications - No data to display  Initial Impression / Assessment and Plan / UC Course  I have reviewed the triage vital signs and the nursing notes.  Pertinent labs & imaging results that were available during my care of the patient were reviewed by me and considered in my medical decision making (see chart for details).     New. Treating with Augmentin, flonase and tessalon. Discussed. Sinus rinses would be helpful. Follow up should symptoms worsen or fail to improve.    Final Clinical Impressions(s) / UC Diagnoses   Final diagnoses:  None   Discharge Instructions   None    ED Prescriptions   None    PDMP not reviewed this encounter.   Hughie Closs, [place] 08/09/21 1413

## 2021-08-09 NOTE — ED Triage Notes (Signed)
Patient c/o cough, chest congestion and nasal congestion that started 3 weeks.  Patient reports some discomfort in both ears.  Patient denies fevers.

## 2021-08-20 ENCOUNTER — Encounter: Payer: Self-pay | Admitting: Podiatry

## 2021-08-20 ENCOUNTER — Other Ambulatory Visit: Payer: Self-pay

## 2021-08-20 ENCOUNTER — Ambulatory Visit: Payer: Medicare HMO | Admitting: Podiatry

## 2021-08-20 DIAGNOSIS — M722 Plantar fascial fibromatosis: Secondary | ICD-10-CM

## 2021-08-20 DIAGNOSIS — M7662 Achilles tendinitis, left leg: Secondary | ICD-10-CM | POA: Diagnosis not present

## 2021-08-20 MED ORDER — TRIAMCINOLONE ACETONIDE 40 MG/ML IJ SUSP
20.0000 mg | Freq: Once | INTRAMUSCULAR | Status: AC
  Administered 2021-08-20: 20 mg

## 2021-08-20 MED ORDER — DEXAMETHASONE SODIUM PHOSPHATE 120 MG/30ML IJ SOLN
2.0000 mg | Freq: Once | INTRAMUSCULAR | Status: AC
  Administered 2021-08-20: 2 mg via INTRA_ARTICULAR

## 2021-08-20 NOTE — Progress Notes (Signed)
She presents today for follow-up of her Achilles tendinitis and plantar fasciitis of her left foot.  She states that it started cramping up again last week but they have been fixing a lot of sausage so she has been on her feet a lot.  She states I am ready for shot at this point.  Objective: Vital signs stable oriented x3 there is no erythema edema cellulitis drainage odor that she does have pain to palpation MucoClear typical of the left heel and a painful spot on palpation of the Achilles on its medial fibers of its insertion of the calcaneus.  There is an area of fluctuance here.  Assessment: Most likely a retrocalcaneal or a retro-Achilles bursitis possible Achilles tendinitis and plantar fasciitis.  Plan: I injected the plantar fascia today 10 mg, 5 mg Marcaine point maximal tenderness.  Injected the area of fluctuance make sure not to inject into the tendon with 2 mg of dexamethasone and local anesthetic she tolerated procedure well and I will follow-up with her on an as-needed basis reminded her about shoe gear today.

## 2021-10-29 ENCOUNTER — Ambulatory Visit: Payer: Medicare HMO | Admitting: Podiatry

## 2021-10-29 DIAGNOSIS — M722 Plantar fascial fibromatosis: Secondary | ICD-10-CM

## 2021-11-03 ENCOUNTER — Ambulatory Visit: Payer: Medicare HMO | Admitting: Podiatry

## 2021-11-03 DIAGNOSIS — M722 Plantar fascial fibromatosis: Secondary | ICD-10-CM

## 2021-11-03 DIAGNOSIS — M7662 Achilles tendinitis, left leg: Secondary | ICD-10-CM | POA: Diagnosis not present

## 2021-11-03 MED ORDER — METHYLPREDNISOLONE 4 MG PO TBPK: ORAL_TABLET | ORAL | 0 refills | Status: DC

## 2021-11-03 NOTE — Progress Notes (Signed)
?  Subjective:  ?Patient ID: Misty Hebert, female    DOB: [birth year],  MRN: [id number] ? ?Chief Complaint  ?Patient presents with  ? Tendonitis  ?   left foot pain/ needs injection  ? ? ?71 y.o. female presents with the above complaint. History confirmed with patient.  She presents today with ongoing left heel pain.  This is continue to wax and wanes.  She recently had an injection by Dr. Milinda Pointer which helped some but the pain is returned.  She is going to [place] for a trip on Saturday. ? ?Objective:  ?Physical Exam: ?warm, good capillary refill, no trophic changes or ulcerative lesions, normal DP and PT pulses, and normal sensory exam. ?Left Foot: point tenderness over the heel pad and tenderness at Achilles tendon insertion ? ?Assessment:  ? ?1. Plantar fasciitis, left   ?2. Achilles tendinitis of left lower extremity   ? ? ? ?Plan:  ?Patient was evaluated and treated and all questions answered. ? ?We can discuss treatment for her plantar fasciitis and Achilles tendinitis.  I discussed with her it be okay to repeat a planter fasciitis injection which we did today.  I cautioned her against doing a Achilles injection so close to the other 1 as well as the fact that she is about to be on her feet for quite a bit with her upcoming trip.  We discussed the risk of weakening the tendon with repeated injections in this area.  I recommended treating with a oral corticosteroid instead and sent a methylprednisolone taper to her pharmacy.  Advised to continue stretching and home therapy plan for both issues.  We also discussed further treatment if this does not improve including an MRI to evaluate further to update this compared to her last MRI last year.  She will return as needed after her trip. ? ?Return if symptoms worsen or fail to improve.  ? ?

## 2021-11-10 ENCOUNTER — Other Ambulatory Visit: Payer: Self-pay | Admitting: Nurse Practitioner

## 2021-11-10 DIAGNOSIS — R1310 Dysphagia, unspecified: Secondary | ICD-10-CM

## 2021-11-24 ENCOUNTER — Other Ambulatory Visit: Payer: Self-pay | Admitting: Surgery

## 2021-11-24 DIAGNOSIS — K432 Incisional hernia without obstruction or gangrene: Secondary | ICD-10-CM

## 2021-11-25 ENCOUNTER — Telehealth: Payer: Self-pay | Admitting: Internal Medicine

## 2021-11-25 NOTE — Telephone Encounter (Signed)
Received note from Dr Gayland Curry to be seen for essential hypertension Mailbox full

## 2022-01-07 ENCOUNTER — Telehealth: Payer: Self-pay | Admitting: *Deleted

## 2022-01-07 NOTE — Telephone Encounter (Signed)
Patient left message on nurse line stating she is in pain and needs foot checked-[facility] patient

## 2022-01-07 NOTE — Telephone Encounter (Signed)
Patient is scheduled with Dr Amalia Hailey on 01/13/2022 for an appointment

## 2022-01-09 ENCOUNTER — Encounter: Payer: Self-pay | Admitting: Podiatry

## 2022-01-09 ENCOUNTER — Ambulatory Visit: Payer: Medicare HMO | Admitting: Podiatry

## 2022-01-09 DIAGNOSIS — M722 Plantar fascial fibromatosis: Secondary | ICD-10-CM | POA: Diagnosis not present

## 2022-01-09 MED ORDER — TRIAMCINOLONE ACETONIDE 10 MG/ML IJ SUSP
10.0000 mg | Freq: Once | INTRAMUSCULAR | Status: AC
  Administered 2022-01-09: 10 mg

## 2022-01-10 NOTE — Progress Notes (Signed)
Subjective:   Patient ID: Misty Hebert, female   DOB: 71 y.o.   MRN: [id number]   HPI Patient presents stating she has been having a lot of pain again in her left heel after being active   ROS      Objective:  Physical Exam  Neurovascular status intact with inflammation pain of the left plantar fascia at the insertion of the tendon into the calcaneus      Assessment:  Acute plantar fasciitis of the left heel at the insertion of the tendon into the calcaneus     Plan:  Reviewed condition with patient sterile prep injected the fascia at insertion 3 mg Kenalog 5 mg Xylocaine sterile dressing applied

## 2022-01-13 ENCOUNTER — Ambulatory Visit: Payer: Medicare HMO | Admitting: Podiatry

## 2022-02-06 ENCOUNTER — Ambulatory Visit: Payer: Medicare HMO | Admitting: Anesthesiology

## 2022-02-06 ENCOUNTER — Encounter
Admission: RE | Disposition: A | Discharge status: Home / Self Care | Payer: Self-pay | Source: Home / Self Care | Attending: Gastroenterology

## 2022-02-06 ENCOUNTER — Ambulatory Visit
Admission: RE | Admit: 2022-02-06 | Discharge: 2022-02-06 | Disposition: A | Discharge status: Home / Self Care | Payer: Medicare HMO | Attending: Gastroenterology | Admitting: Gastroenterology

## 2022-02-06 DIAGNOSIS — I10 Essential (primary) hypertension: Secondary | ICD-10-CM | POA: Diagnosis not present

## 2022-02-06 DIAGNOSIS — R1310 Dysphagia, unspecified: Secondary | ICD-10-CM | POA: Insufficient documentation

## 2022-02-06 DIAGNOSIS — F32A Depression, unspecified: Secondary | ICD-10-CM | POA: Insufficient documentation

## 2022-02-06 DIAGNOSIS — R053 Chronic cough: Secondary | ICD-10-CM | POA: Diagnosis not present

## 2022-02-06 DIAGNOSIS — K449 Diaphragmatic hernia without obstruction or gangrene: Secondary | ICD-10-CM | POA: Insufficient documentation

## 2022-02-06 DIAGNOSIS — E669 Obesity, unspecified: Secondary | ICD-10-CM | POA: Insufficient documentation

## 2022-02-06 DIAGNOSIS — K219 Gastro-esophageal reflux disease without esophagitis: Secondary | ICD-10-CM | POA: Insufficient documentation

## 2022-02-06 HISTORY — PX: ESOPHAGOGASTRODUODENOSCOPY (EGD) WITH PROPOFOL: SHX5813

## 2022-02-06 SURGERY — ESOPHAGOGASTRODUODENOSCOPY (EGD) WITH PROPOFOL
Anesthesia: General

## 2022-02-06 MED ORDER — SODIUM CHLORIDE 0.9 % IV SOLN
INTRAVENOUS | Status: DC
Start: 2022-02-06 — End: 2022-02-06
  Administered 2022-02-06: 1000 mL via INTRAVENOUS

## 2022-02-06 MED ORDER — PROPOFOL 10 MG/ML IV BOLUS
INTRAVENOUS | Status: DC | PRN
  Administered 2022-02-06: 70 mg via INTRAVENOUS

## 2022-02-06 MED ORDER — LIDOCAINE HCL (CARDIAC) PF 100 MG/5ML IV SOSY
PREFILLED_SYRINGE | INTRAVENOUS | Status: DC | PRN
  Administered 2022-02-06: 50 mg via INTRAVENOUS

## 2022-02-06 MED ORDER — PROPOFOL 500 MG/50ML IV EMUL
INTRAVENOUS | Status: DC | PRN
  Administered 2022-02-06: 150 ug/kg/min via INTRAVENOUS

## 2022-02-06 NOTE — Anesthesia Preprocedure Evaluation (Signed)
Anesthesia Evaluation  Patient identified by MRN, date of birth, ID band Patient awake    Reviewed: Allergy & Precautions, NPO status , Patient's Chart, lab work & pertinent test results  Airway Mallampati: III  TM Distance: >3 FB Neck ROM: Full    Dental  (+) Caps,    Pulmonary neg pulmonary ROS, shortness of breath,    Pulmonary exam normal  + decreased breath sounds      Cardiovascular Exercise Tolerance: Good hypertension, Pt. on medications negative cardio ROS Normal cardiovascular exam Rhythm:Regular     Neuro/Psych  Headaches, Depression negative neurological ROS  negative psych ROS   GI/Hepatic negative GI ROS, Neg liver ROS, GERD  Medicated,  Endo/Other  negative endocrine ROSMorbid obesity  Renal/GU negative Renal ROS  negative genitourinary   Musculoskeletal   Abdominal (+) + obese,   Peds negative pediatric ROS (+)  Hematology negative hematology ROS (+)   Anesthesia Other Findings Past Medical History: No date: Actinic keratosis No date: Arthritis of knee No date: Benign essential HTN No date: Chest pain     Comment:  a. she reports 4 normal heart caths; b. 12/2013 St Echo:               nl Jefm Bryant); c. 04/2016 MV: apical and ant defect @               rest consistent w/ breast attenuation. No ischemia. EF               55-65%. No date: Depression No date: Diverticulitis of intestine w/o perforation or abscess w/o  bleeding No date: DVT (deep venous thrombosis) (HCC) No date: Dysphagia No date: Fibromyalgia No date: High cholesterol No date: History of colon resection No date: History of echocardiogram     Comment:  a. 01/2017 Echo ([facility]): nl LV syst fxn. No date: Hyperlipidemia No date: Medial meniscus tear No date: Migraine No date: Neuromuscular disorder (HCC) No date: Obesity No date: Overactive bladder No date: Reflux No date: Subchondral insufficiency fracture of condyle of femur  (HCC) No date: Tension headache  Past Surgical History: No date: ABDOMINAL HYSTERECTOMY No date: BREAST BIOPSY; Right     Comment:  CYST REMOVED-16-48YRS AGO-NEG 03/24/2021: BREAST BIOPSY; Left     Comment:  stereo bx of calcs UOQ anterior #1, coil marker, path               pending 03/24/2021: BREAST BIOPSY; Left     Comment:  stereo bx calcs UOQ posterior #2, x marker, path pending 1995: CARDIAC CATHETERIZATION     Comment:  [facility] 2001: CARDIAC CATHETERIZATION     Comment:  [facility] 2006: CARDIAC CATHETERIZATION     Comment:  [facility] No date: CARDIAC CATHETERIZATION     Comment:  [facility] with Dr. Nehemiah Massed  No date: COLON RESECTION 01/10/2016: FOOT SURGERY; Left     Comment:  03/06/16 second foot surgery No date: KNEE SURGERY No date: REVISION TOTAL HIP ARTHROPLASTY     Comment:  June 2019 No date: SHOULDER SURGERY  BMI    Body Mass Index: 40.64 kg/m      Reproductive/Obstetrics negative OB ROS                             Anesthesia Physical Anesthesia Plan  ASA: 3  Anesthesia Plan: General   Post-op Pain Management:    Induction: Intravenous  PONV Risk Score and Plan: Propofol infusion  and TIVA  Airway Management Planned: Natural Airway  Additional Equipment:   Intra-op Plan:   Post-operative Plan:   Informed Consent: I have reviewed the patients History and Physical, chart, labs and discussed the procedure including the risks, benefits and alternatives for the proposed anesthesia with the patient or authorized representative who has indicated his/her understanding and acceptance.     Dental Advisory Given  Plan Discussed with: CRNA and Surgeon  Anesthesia Plan Comments:         Anesthesia Quick Evaluation

## 2022-02-06 NOTE — Interval H&P Note (Signed)
History and Physical Interval Note:  02/06/2022 10:14 AM  Misty Hebert  has presented today for surgery, with the diagnosis of Dysphagia GERD.  The various methods of treatment have been discussed with the patient and family. After consideration of risks, benefits and other options for treatment, the patient has consented to  Procedure(s): ESOPHAGOGASTRODUODENOSCOPY (EGD) WITH PROPOFOL (N/A) as a surgical intervention.  The patient's history has been reviewed, patient examined, no change in status, stable for surgery.  I have reviewed the patient's chart and labs.  Questions were answered to the patient's satisfaction.     Lesly Rubenstein  Ok to proceed with EGD

## 2022-02-06 NOTE — H&P (Signed)
Outpatient short stay form Pre-procedure 02/06/2022  Lesly Rubenstein, MD  Primary Physician: Gayland Curry, MD  Reason for visit:  Cough/Dysphagia  History of present illness:    71 y/o lady with history of obesity, hypertension, and depression here for EGD for chronic cough and occasional dysphagia. No blood thinners. No family history of GI malignancies. Has history of cervical neck surgeries.    Current Facility-Administered Medications:    0.9 %  sodium chloride infusion, , Intravenous, Continuous, Gwen Sarvis, Hilton Cork, MD, Last Rate: 20 mL/hr at 02/06/22 0916, Restarted at 02/06/22 1004  Medications Prior to Admission  Medication Sig Dispense Refill Last Dose   amoxicillin-clavulanate (AUGMENTIN) 875-125 MG tablet Take 1 tablet by mouth every 12 (twelve) hours. 14 tablet 0 02/05/2022   benzonatate (TESSALON) 200 MG capsule Take 1 capsule (200 mg total) by mouth 3 (three) times daily as needed for cough. 20 capsule 0 02/05/2022   carvedilol (COREG) 6.25 MG tablet Take 6.25 mg by mouth 2 (two) times daily with a meal.   02/05/2022   dicyclomine (BENTYL) 10 MG capsule Take 10 mg by mouth 2 (two) times daily as needed.   02/05/2022   FLUoxetine (PROZAC) 10 MG capsule Take by mouth.   02/05/2022   fluticasone (FLONASE) 50 MCG/ACT nasal spray Place 2 sprays into both nostrils daily. 16 mL 0 02/05/2022   hydrochlorothiazide (HYDRODIURIL) 25 MG tablet Take 1 tablet (25 mg total) by mouth daily. 90 tablet 2 02/05/2022   HYDROcodone-homatropine (HYCODAN) 5-1.5 MG/5ML syrup 5 ml qhs if tessalon perless is not helping 120 mL 0 02/05/2022   losartan (COZAAR) 50 MG tablet Take 1.5 tablets (75 mg total) by mouth daily. 90 tablet 2 02/05/2022   methylPREDNISolone (MEDROL DOSEPAK) 4 MG TBPK tablet 6 day dose pack - take as directed 21 tablet 0 02/05/2022   nitroGLYCERIN (NITROSTAT) 0.4 MG SL tablet Place 1 tablet (0.4 mg total) under the tongue every 5 (five) minutes as needed for chest pain (maximum of 3 doses.).  25 tablet 4 02/05/2022   pravastatin (PRAVACHOL) 40 MG tablet Take 1 tablet (40 mg total) by mouth every evening. 90 tablet 3 02/05/2022     Allergies  Allergen Reactions   Chlorpromazine Anaphylaxis and Other (See Comments)    Other Reaction: LOC Other Reaction: LOC Other Reaction: LOC Other Reaction: LOC    Metoclopramide Other (See Comments) and Anaphylaxis    Other Reaction: RESTLESS Twitching , cant sit still  Other Reaction: RESTLESS Other Reaction: RESTLESS Other Reaction: RESTLESS    Sulfamethizole Nausea And Vomiting and Other (See Comments)   Amlodipine     BLLE edema   Other Other (See Comments)    Torazine gave her syncope Upset stomach Upset stomach    Celecoxib Nausea Only and Anxiety   Meloxicam Other (See Comments) and Diarrhea     Past Medical History:  Diagnosis Date   Actinic keratosis    Arthritis of knee    Benign essential HTN    Chest pain    a. she reports 4 normal heart caths; b. 12/2013 St Echo: nl Jefm Bryant); c. 04/2016 MV: apical and ant defect @ rest consistent w/ breast attenuation. No ischemia. EF 55-65%.   Depression    Diverticulitis of intestine w/o perforation or abscess w/o bleeding    DVT (deep venous thrombosis) (HCC)    Dysphagia    Fibromyalgia    High cholesterol    History of colon resection    History of echocardiogram    a.  01/2017 Echo ([facility]): nl LV syst fxn.   Hyperlipidemia    Medial meniscus tear    Migraine    Neuromuscular disorder (HCC)    Obesity    Overactive bladder    Reflux    Subchondral insufficiency fracture of condyle of femur (HCC)    Tension headache     Review of systems:  Otherwise negative.    Physical Exam  Gen: Alert, oriented. Appears stated age.  HEENT:PERRLA. Lungs: No respiratory distress CV: RRR Abd: soft, benign, no masses Ext: No edema    Planned procedures: Proceed with EGD. The patient understands the nature of the planned procedure, indications, risks, alternatives and  potential complications including but not limited to bleeding, infection, perforation, damage to internal organs and possible oversedation/side effects from anesthesia. The patient agrees and gives consent to proceed.  Please refer to procedure notes for findings, recommendations and patient disposition/instructions.     Lesly Rubenstein, MD [facility]

## 2022-02-06 NOTE — Anesthesia Procedure Notes (Signed)
Date/Time: 02/06/2022 10:18 AM  Performed by: Johnna Acosta, CRNAPre-anesthesia Checklist: Patient identified, Emergency Drugs available, Suction available, Patient being monitored and Timeout performed Patient Re-evaluated:Patient Re-evaluated prior to induction Oxygen Delivery Method: Nasal cannula Preoxygenation: Pre-oxygenation with 100% oxygen Induction Type: IV induction

## 2022-02-06 NOTE — Transfer of Care (Signed)
Immediate Anesthesia Transfer of Care Note  Patient: Misty Hebert  Procedure(s) Performed: ESOPHAGOGASTRODUODENOSCOPY (EGD) WITH PROPOFOL  Patient Location: PACU  Anesthesia Type:General  Level of Consciousness: awake, alert  and oriented  Airway & Oxygen Therapy: Patient Spontanous Breathing  Post-op Assessment: Report given to RN and Post -op Vital signs reviewed and stable  Post vital signs: Reviewed and stable  Last Vitals:  Vitals Value Taken Time  BP 128/57 02/06/22 1035  Temp 35.7 C 02/06/22 1033  Pulse 74 02/06/22 1035  Resp 15 02/06/22 1035  SpO2 98 % 02/06/22 1035    Last Pain:  Vitals:   02/06/22 1033  TempSrc: Temporal  PainSc: 0-No pain         Complications: No notable events documented.

## 2022-02-06 NOTE — Op Note (Signed)
[facility] Patient Name: Misty Hebert Procedure Date: 02/06/2022 10:15 AM MRN: [id number] Account #: [id number] Date of Birth: 11-Oct-1950 Admit Type: Outpatient Age: 71 Room: [facility] ENDO ROOM 3 Gender: Female Note Status: Finalized Instrument Name: Altamese Cabal Endoscope [id number] Procedure:             Upper GI endoscopy Indications:           Dysphagia, Chronic cough Providers:             Andrey Farmer MD, MD Referring MD:          Gayland Curry MD, MD (Referring MD) Medicines:             Monitored Anesthesia Care Complications:         No immediate complications. Estimated blood loss:                         Minimal. Procedure:             Pre-Anesthesia Assessment:                        - Prior to the procedure, a History and Physical was                         performed, and patient medications and allergies were                         reviewed. The patient is competent. The risks and                         benefits of the procedure and the sedation options and                         risks were discussed with the patient. All questions                         were answered and informed consent was obtained.                         Patient identification and proposed procedure were                         verified by the physician, the nurse, the                         anesthesiologist, the anesthetist and the technician                         in the endoscopy suite. Mental Status Examination:                         alert and oriented. Airway Examination: normal                         oropharyngeal airway and neck mobility. Respiratory                         Examination: clear to auscultation. CV Examination:  normal. Prophylactic Antibiotics: The patient does not                         require prophylactic antibiotics. Prior                         Anticoagulants: The patient has taken no previous                          anticoagulant or antiplatelet agents. ASA Grade                         Assessment: III - A patient with severe systemic                         disease. After reviewing the risks and benefits, the                         patient was deemed in satisfactory condition to                         undergo the procedure. The anesthesia plan was to use                         monitored anesthesia care (MAC). Immediately prior to                         administration of medications, the patient was                         re-assessed for adequacy to receive sedatives. The                         heart rate, respiratory rate, oxygen saturations,                         blood pressure, adequacy of pulmonary ventilation, and                         response to care were monitored throughout the                         procedure. The physical status of the patient was                         re-assessed after the procedure.                        After obtaining informed consent, the endoscope was                         passed under direct vision. Throughout the procedure,                         the patient's blood pressure, pulse, and oxygen                         saturations were monitored continuously. The Endoscope  was introduced through the mouth, and advanced to the                         second part of duodenum. The upper GI endoscopy was                         accomplished without difficulty. The patient tolerated                         the procedure well. Findings:      A small hiatal hernia was present.      Normal mucosa was found in the entire esophagus. Biopsies were taken       with a cold forceps for histology. Estimated blood loss was minimal.      The entire examined stomach was normal.      The examined duodenum was normal. Impression:            - Small hiatal hernia.                        - Normal mucosa was found in the entire esophagus.                          Biopsied.                        - Normal stomach.                        - Normal examined duodenum. Recommendation:        - Discharge patient to home.                        - Resume previous diet.                        - Continue present medications.                        - Await pathology results.                        - Return to referring physician as previously                         scheduled. Procedure Code(s):     --- Professional ---                        [phone], Esophagogastroduodenoscopy, flexible,                         transoral; with biopsy, single or multiple Diagnosis Code(s):     --- Professional ---                        K44.9, Diaphragmatic hernia without obstruction or                         gangrene                        R13.10, Dysphagia, unspecified  R05, Cough CPT copyright 2019 American Medical Association. All rights reserved. The codes documented in this report are preliminary and upon coder review may  be revised to meet current compliance requirements. Andrey Farmer MD, MD 02/06/2022 10:31:48 AM Number of Addenda: 0 Note Initiated On: 02/06/2022 10:15 AM Estimated Blood Loss:  Estimated blood loss was minimal.      [facility]

## 2022-02-06 NOTE — Anesthesia Postprocedure Evaluation (Signed)
Anesthesia Post Note  Patient: Misty Hebert  Procedure(s) Performed: ESOPHAGOGASTRODUODENOSCOPY (EGD) WITH PROPOFOL  Anesthesia Type: General Anesthetic complications: no   No notable events documented.   Last Vitals:  Vitals:   02/06/22 1033 02/06/22 1035  BP: (!) 128/57 (!) 128/57  Pulse:  74  Resp:  15  Temp: (!) 35.7 C   SpO2:  98%    Last Pain:  Vitals:   02/06/22 1053  TempSrc:   PainSc: 0-No pain                 VAN STAVEREN,Francois Elk

## 2022-02-09 ENCOUNTER — Encounter: Payer: Self-pay | Admitting: Gastroenterology

## 2022-02-10 LAB — SURGICAL PATHOLOGY

## 2022-03-04 ENCOUNTER — Other Ambulatory Visit: Payer: Self-pay | Admitting: Family Medicine

## 2022-03-04 DIAGNOSIS — Z1231 Encounter for screening mammogram for malignant neoplasm of breast: Secondary | ICD-10-CM

## 2022-03-10 ENCOUNTER — Other Ambulatory Visit: Payer: Self-pay | Admitting: Internal Medicine

## 2022-03-25 ENCOUNTER — Ambulatory Visit: Payer: Medicare HMO

## 2022-03-26 ENCOUNTER — Ambulatory Visit: Payer: Medicare HMO

## 2022-03-30 ENCOUNTER — Ambulatory Visit
Admission: RE | Admit: 2022-03-30 | Discharge: 2022-03-30 | Disposition: A | Discharge status: Home / Self Care | Payer: Medicare HMO | Source: Ambulatory Visit | Attending: Family Medicine | Admitting: Family Medicine

## 2022-03-30 DIAGNOSIS — Z1231 Encounter for screening mammogram for malignant neoplasm of breast: Secondary | ICD-10-CM | POA: Diagnosis not present

## 2022-04-29 ENCOUNTER — Encounter: Payer: Self-pay | Admitting: Internal Medicine

## 2022-04-29 ENCOUNTER — Ambulatory Visit: Payer: Medicare HMO | Attending: Internal Medicine | Admitting: Internal Medicine

## 2022-04-29 VITALS — BP 142/82 | HR 78 | Ht 63.0 in | Wt 219.0 lb

## 2022-04-29 DIAGNOSIS — E1169 Type 2 diabetes mellitus with other specified complication: Secondary | ICD-10-CM

## 2022-04-29 DIAGNOSIS — I25119 Atherosclerotic heart disease of native coronary artery with unspecified angina pectoris: Secondary | ICD-10-CM | POA: Diagnosis not present

## 2022-04-29 DIAGNOSIS — I10 Essential (primary) hypertension: Secondary | ICD-10-CM | POA: Diagnosis not present

## 2022-04-29 DIAGNOSIS — I5032 Chronic diastolic (congestive) heart failure: Secondary | ICD-10-CM

## 2022-04-29 DIAGNOSIS — E785 Hyperlipidemia, unspecified: Secondary | ICD-10-CM

## 2022-04-29 MED ORDER — PRAVASTATIN SODIUM 80 MG PO TABS: 80.0000 mg | ORAL_TABLET | Freq: Every evening | ORAL | 0 refills | Status: DC

## 2022-04-29 MED ORDER — PRAVASTATIN SODIUM 80 MG PO TABS: 80.0000 mg | ORAL_TABLET | Freq: Every evening | ORAL | 1 refills | Status: DC

## 2022-04-29 MED ORDER — HYDROCHLOROTHIAZIDE 25 MG PO TABS: 25.0000 mg | ORAL_TABLET | Freq: Every day | ORAL | 1 refills | Status: DC

## 2022-04-29 NOTE — Patient Instructions (Signed)
Medication Instructions:   Your physician has recommended you make the following change in your medication:    START taking Hydrochlorothiazide 25 MG once a day.  2.    INCREASE your Pravastatin to 80 MG once a day.  *If you need a refill on your cardiac medications before your next appointment, please call your pharmacy*    Follow-Up: At [facility], you and your health needs are our priority.  As part of our continuing mission to provide you with exceptional heart care, we have created designated Provider Care Teams.  These Care Teams include your primary Cardiologist (physician) and Advanced Practice Providers (APPs -  Physician Assistants and Nurse Practitioners) who all work together to provide you with the care you need, when you need it.  We recommend signing up for the patient portal called "MyChart".  Sign up information is provided on this After Visit Summary.  MyChart is used to connect with patients for Virtual Visits (Telemedicine).  Patients are able to view lab/test results, encounter notes, upcoming appointments, etc.  Non-urgent messages can be sent to your provider as well.   To learn more about what you can do with MyChart, go to [email or web address].    Your next appointment:   6 month(s)  The format for your next appointment:   In Person  Provider:   You may see Nelva Bush, MD or one of the following Advanced Practice Providers on your designated Care Team:   Murray Hodgkins, NP Christell Faith, PA-C Cadence Kathlen Mody, PA-C Gerrie Nordmann, NP    Other Instructions   Important Information About Sugar

## 2022-04-29 NOTE — Progress Notes (Unsigned)
Follow-up Outpatient Visit Date: 04/29/2022  Primary Care Provider: Gayland Curry, MD [place] [address]  Chief Complaint: Follow-up chest pain  HPI:  Misty Hebert is a 71 y.o. female with history of  chronic intermittent chest pain, hypertension, hyperlipidemia, type 2 diabetes mellitus, DVT, GERD, and hiatal hernia, who presents for follow-up of chest pain and hypertension.  I last saw her a year ago, at which time she was feeling well with only a few minor episodes of chest discomfort over the preceding 6 months.  Her mobility was fairly limited due to hop pain.  She complained of myalgias with rosuvastatin, prompting [place] to switch to pravastatin (she tolerated this better in the past).  Due to elevated BP, we agreed to increase losartan to 75 mg daily.  One month follow-up for BP check was recommended.  Today, Misty Hebert reports that she is feeling fairly well.  She still has occasional "spasms" in her chest.  They have happened 4-5 times over the last year and happen randomly.  They can last anywhere from 2 to 30 minutes.  She has not tried sublingual nitroglycerin for the pain.  There are no associated symptoms such as dyspnea, palpitations, and lightheadedness.  She has stable exertional dyspnea when walking uphill.  She has not had any exertional chest pain nor orthopnea or edema.  She reports having undergone an EGD this summer and states there were no significant abnormalities identified.  She tries to walk when possible but is dealing with ongoing pain in the right hip that limits the duration of her walking.  She is only taking HCTZ on a as needed basis if she has leg swelling.  She is using semaglutide for glycemic control and weight loss.  --------------------------------------------------------------------------------------------------  Cardiovascular History & Procedures: Cardiovascular Problems: Atypical chest pain   Risk Factors: Hypertension,  hyperlipidemia, diabetes mellitus, family history, and obesity   Cath/PCI: None   CV Surgery: None   EP Procedures and Devices: None   Non-Invasive Evaluation(s): Cardiac CTA (12/28/2019): LMCA normal.  LAD with less than 25% noncalcified stenosis in the proximal vessel.  LCx without significant disease.  Dominant RCA without significant disease, though misregistration in the mid RCA limited evaluation over a short segment.  Aortic atherosclerosis noted. Echocardiogram (01/06/2017, [facility]): LVEF greater than 55% with grade 1 diastolic dysfunction.  Normal RV size and function.  No significant valvular abnormality. Myocardial perfusion stress test (05/05/16): Low risk study with no significant ischemia. Moderate in size, mild in severity, apical and anterior defect at rest is consistent with shifting breast attenuation. LVEF normal (55-65%). Exercise stress echo (01/01/14, [facility]): Normal  Recent CV Pertinent Labs: Lab Results  Component Value Date   INR 1.1 07/14/2012   K 5.1 05/07/2021   K 4.0 10/26/2014   BUN 11 05/07/2021   BUN 13 10/26/2014   CREATININE 0.83 05/07/2021   CREATININE 1.02 (H) 10/26/2014    Past medical and surgical history were reviewed and updated in EPIC.  Current Meds  Medication Sig   carvedilol (COREG) 6.25 MG tablet Take 6.25 mg by mouth 2 (two) times daily with a meal.   dicyclomine (BENTYL) 10 MG capsule Take 10 mg by mouth 2 (two) times daily as needed.   fluticasone (FLONASE) 50 MCG/ACT nasal spray Place 2 sprays into both nostrils daily.   hydrochlorothiazide (HYDRODIURIL) 25 MG tablet Take 1 tablet (25 mg total) by mouth daily.   losartan (COZAAR) 100 MG tablet Take 100 mg by  mouth daily.   nitroGLYCERIN (NITROSTAT) 0.4 MG SL tablet Place 1 tablet (0.4 mg total) under the tongue every 5 (five) minutes as needed for chest pain (maximum of 3 doses.).   pravastatin (PRAVACHOL) 40 MG tablet TAKE 1 TABLET (40 MG TOTAL) BY MOUTH EVERY  EVENING.   Semaglutide,0.25 or 0.'5MG'$ /DOS, (OZEMPIC, 0.25 OR 0.5 MG/DOSE,) 2 MG/1.5ML SOPN Inject 0.5 mg into the skin once a week.   [DISCONTINUED] losartan (COZAAR) 50 MG tablet Take 1.5 tablets (75 mg total) by mouth daily.    Allergies: Chlorpromazine, Metoclopramide, Sulfamethizole, Amlodipine, Other, Celecoxib, and Meloxicam  Social History   Tobacco Use   Smoking status: Never   Smokeless tobacco: Never  Vaping Use   Vaping Use: Never used  Substance Use Topics   Alcohol use: No   Drug use: No    Family History  Problem Relation Age of Onset   Breast cancer Maternal Aunt 20   Heart attack Mother    Heart disease Mother        pacemaker/ICD   Heart attack Father    Stroke Father    Lung cancer Father    Heart disease Brother        stents in early 61's    Review of Systems: A 12-system review of systems was performed and was negative except as noted in the HPI.  --------------------------------------------------------------------------------------------------  Physical Exam: BP (!) 142/82 (BP Location: Left Arm)   Pulse 78   Ht '5\' 3"'$  (1.6 m)   Wt 219 lb (99.3 kg)   LMP  (LMP Unknown)   SpO2 98%   BMI 38.79 kg/m  Repeat BP: 142/82  General:  NAD. Neck: No JVD or HJR. Lungs: Clear to auscultation bilaterally without wheezes or crackles. Heart: Regular rate and rhythm without murmurs, rubs, or gallops. Abdomen: Soft, nontender, nondistended. Extremities: No lower extremity edema.  EKG: Normal sinus rhythm with left axis deviation, borderline LVH, and poor R wave progression likely representing lead placement.  No significant change since 04/23/2021.  Outside labs: BMP (04/08/2022): Sodium 142, potassium 4.9, chloride 107, CO2 25, BUN 12, creatinine 1.0, calcium 9.4  Hepatic panel (03/27/2022): AST 42, ALT 33, alkaline phosphatase 100, total bilirubin 1.1, total protein 6.7, BUN 3.9  CBC (03/27/2022): WBC 8.0, Hgb 15.4, HCT 45.8, PLT 237  TSH (11/24/2021):  1.41  Lipid panel (08/18/2021): Total cholesterol 231, triglycerides 89, HDL 43, LDL 170  --------------------------------------------------------------------------------------------------  ASSESSMENT AND PLAN: Coronary artery disease with chronic chest pain: Mr. Wyke continues to have sporadic nonexertional chest pain that she describes as a "spasm."  We have postulated in the past that this could represent coronary vasospasm or esophageal spasm.  She has not tried taking sublingual nitroglycerin; I have encouraged her to try this if she has further episodes.  Given relatively infrequent occurrence, we will defer adding standing calcium channel blocker or long-acting nitrate.  Continue current dose of carvedilol.  Hypertension: Blood pressure mildly elevated today.  I have advised Ms. Sheppard to take HCTZ 25 mg daily rather than on a as needed basis.  Continue current doses of carvedilol and losartan.  She is due for labs and clinical follow-up with Dr. Emelia Loron next month.  Hyperlipidemia associated with type 2 diabetes mellitus: Most recent lipid panel in 08/2021 was notable for a significantly elevated LDL of 170.  Given that Ms. Soffer has been intolerant of rosuvastatin in the past, we have agreed to increase pravastatin to 80 mg daily and to continue working on lifestyle modifications.  I am hopeful that weight loss associated with semaglutide use will also improve her lipid profile.  She is scheduled for follow-up labs with Dr. Emelia Loron next month.  Morbid obesity: Weight is improving though BMI remains above 35 in the setting of multiple comorbidities including type 2 diabetes mellitus, hypertension, hyperlipidemia, and coronary artery disease.  I encouraged her to keep working on weight loss through diet and exercise and ongoing semaglutide use.  Follow-up: Return to clinic in 6 months.  Nelva Bush, MD 04/29/2022 10:34 AM

## 2022-04-30 ENCOUNTER — Encounter: Payer: Self-pay | Admitting: Internal Medicine

## 2022-04-30 DIAGNOSIS — I25119 Atherosclerotic heart disease of native coronary artery with unspecified angina pectoris: Secondary | ICD-10-CM | POA: Insufficient documentation

## 2022-06-16 ENCOUNTER — Ambulatory Visit: Payer: Medicare HMO | Admitting: Dermatology

## 2022-06-17 ENCOUNTER — Ambulatory Visit: Payer: Medicare HMO | Admitting: Dermatology

## 2022-07-28 ENCOUNTER — Encounter: Payer: Self-pay | Admitting: Podiatry

## 2022-07-28 ENCOUNTER — Ambulatory Visit: Payer: Medicare HMO | Admitting: Podiatry

## 2022-07-28 VITALS — BP 168/86 | HR 80

## 2022-07-28 DIAGNOSIS — M722 Plantar fascial fibromatosis: Secondary | ICD-10-CM

## 2022-07-28 DIAGNOSIS — M7662 Achilles tendinitis, left leg: Secondary | ICD-10-CM | POA: Diagnosis not present

## 2022-07-28 MED ORDER — METHYLPREDNISOLONE 4 MG PO TBPK: ORAL_TABLET | ORAL | 0 refills | Status: DC

## 2022-07-28 MED ORDER — TRIAMCINOLONE ACETONIDE 40 MG/ML IJ SUSP
20.0000 mg | Freq: Once | INTRAMUSCULAR | Status: AC
  Administered 2022-07-28: 20 mg

## 2022-07-28 MED ORDER — DEXAMETHASONE SODIUM PHOSPHATE 120 MG/30ML IJ SOLN
2.0000 mg | Freq: Once | INTRAMUSCULAR | Status: AC
  Administered 2022-07-28: 2 mg via INTRA_ARTICULAR

## 2022-07-28 NOTE — Progress Notes (Signed)
Arnett presents today for follow-up of her Planter fasciitis left heel.  States that is flared up again.  Objective: Vital signs are stable she is alert and oriented x 3.  She has pain on palpation medial calcaneal tubercle of her left heel.  No pain on lateral palpation.  Assessment: Planter fasciitis.  Plan: I injected 20 mg Kenalog 5 mg Marcaine to the point maximal tenderness.  Tolerated procedure well without complications.  I also started her on a Medrol Dosepak I will follow-up with her on an as-needed basis.

## 2022-09-21 ENCOUNTER — Ambulatory Visit: Payer: Medicare HMO | Admitting: Dermatology

## 2022-10-22 ENCOUNTER — Other Ambulatory Visit: Payer: Self-pay | Admitting: Orthopedic Surgery

## 2022-10-22 DIAGNOSIS — Z96641 Presence of right artificial hip joint: Secondary | ICD-10-CM

## 2022-10-29 ENCOUNTER — Ambulatory Visit: Payer: Medicare HMO | Admitting: Internal Medicine

## 2022-11-04 ENCOUNTER — Encounter
Admission: RE | Admit: 2022-11-04 | Discharge: 2022-11-04 | Disposition: A | Discharge status: Home / Self Care | Payer: Medicare HMO | Source: Ambulatory Visit | Attending: Orthopedic Surgery | Admitting: Orthopedic Surgery

## 2022-11-04 DIAGNOSIS — Z96641 Presence of right artificial hip joint: Secondary | ICD-10-CM | POA: Insufficient documentation

## 2022-11-04 MED ORDER — TECHNETIUM TC 99M MEDRONATE IV KIT
20.0000 | PACK | Freq: Once | INTRAVENOUS | Status: AC | PRN
  Administered 2022-11-04: 21.5 via INTRAVENOUS

## 2022-11-12 ENCOUNTER — Ambulatory Visit: Payer: Medicare HMO | Admitting: Dermatology

## 2022-11-12 DIAGNOSIS — X32XXXA Exposure to sunlight, initial encounter: Secondary | ICD-10-CM

## 2022-11-12 DIAGNOSIS — L918 Other hypertrophic disorders of the skin: Secondary | ICD-10-CM | POA: Diagnosis not present

## 2022-11-12 DIAGNOSIS — L720 Epidermal cyst: Secondary | ICD-10-CM | POA: Diagnosis not present

## 2022-11-12 DIAGNOSIS — Z872 Personal history of diseases of the skin and subcutaneous tissue: Secondary | ICD-10-CM

## 2022-11-12 DIAGNOSIS — L578 Other skin changes due to chronic exposure to nonionizing radiation: Secondary | ICD-10-CM

## 2022-11-12 DIAGNOSIS — L729 Follicular cyst of the skin and subcutaneous tissue, unspecified: Secondary | ICD-10-CM

## 2022-11-12 DIAGNOSIS — L738 Other specified follicular disorders: Secondary | ICD-10-CM

## 2022-11-12 DIAGNOSIS — W908XXA Exposure to other nonionizing radiation, initial encounter: Secondary | ICD-10-CM

## 2022-11-12 DIAGNOSIS — L814 Other melanin hyperpigmentation: Secondary | ICD-10-CM

## 2022-11-12 DIAGNOSIS — D229 Melanocytic nevi, unspecified: Secondary | ICD-10-CM

## 2022-11-12 DIAGNOSIS — Z1283 Encounter for screening for malignant neoplasm of skin: Secondary | ICD-10-CM

## 2022-11-12 DIAGNOSIS — D692 Other nonthrombocytopenic purpura: Secondary | ICD-10-CM

## 2022-11-12 DIAGNOSIS — D1801 Hemangioma of skin and subcutaneous tissue: Secondary | ICD-10-CM

## 2022-11-12 DIAGNOSIS — I781 Nevus, non-neoplastic: Secondary | ICD-10-CM

## 2022-11-12 DIAGNOSIS — L821 Other seborrheic keratosis: Secondary | ICD-10-CM

## 2022-11-12 NOTE — Progress Notes (Signed)
Follow-Up Visit   Subjective  Misty Hebert is a 72 y.o. female who presents for the following: Skin Cancer Screening and Full Body Skin Exam - history of [place] The patient presents for Total-Body Skin Exam (TBSE) for skin cancer screening and mole check. The patient has spots, moles and lesions to be evaluated, some may be new or changing and the patient has concerns that these could be cancer.  The following portions of the chart were reviewed this encounter and updated as appropriate: medications, allergies, medical history  Review of Systems:  No other skin or systemic complaints except as noted in HPI or Assessment and Plan.  Objective  Well appearing patient in no apparent distress; mood and affect are within normal limits.  A full examination was performed including scalp, head, eyes, ears, nose, lips, neck, chest, axillae, abdomen, back, buttocks, bilateral upper extremities, bilateral lower extremities, hands, feet, fingers, toes, fingernails, and toenails. All findings within normal limits unless otherwise noted below.   Relevant physical exam findings are noted in the Assessment and Plan.  Anterior neck x 1, posterior neck x 1 (2) Fleshy, skin-colored pedunculated papules.     Assessment & Plan   Sebaceous Hyperplasia - Small yellow papules with a central dell - Benign-appearing - Observe. Call for changes.  Milia - tiny firm white papules - type of cyst - benign - may be extracted if symptomatic - observe  Telangiectasia - Dilated blood vessel - Benign appearing on exam - Call for changes  Purpura - Chronic; persistent and recurrent.  Treatable, but not curable. - Violaceous macules and patches - Benign - Related to trauma, age, sun damage and/or use of blood thinners, chronic use of topical and/or oral steroids - Observe - Can use OTC arnica containing moisturizer such as Dermend Bruise Formula if desired - Call for worsening or other  concerns  EPIDERMAL INCLUSION CYST Exam: Subcutaneous nodule at right post shoulder 0.5 cm  Benign-appearing. Exam most consistent with an epidermal inclusion cyst. Discussed that a cyst is a benign growth that can grow over time and sometimes get irritated or inflamed. Recommend observation if it is not bothersome. Discussed option of surgical excision to remove it if it is growing, symptomatic, or other changes noted. Please call for new or changing lesions so they can be evaluated.  Acrochordons (Skin Tags) - Fleshy, skin-colored pedunculated papules - Benign appearing.  - Observe. - If desired, they can be removed with an in office procedure that is not covered by insurance. - Please call the clinic if you notice any new or changing lesions.   LENTIGINES, SEBORRHEIC KERATOSES, HEMANGIOMAS - Benign normal skin lesions - Benign-appearing - Call for any changes  MELANOCYTIC NEVI - Tan-brown and/or pink-flesh-colored symmetric macules and papules - Benign appearing on exam today - Observation - Call clinic for new or changing moles - Recommend daily use of broad spectrum spf 30+ sunscreen to sun-exposed areas.   ACTINIC DAMAGE - Chronic condition, secondary to cumulative UV/sun exposure - diffuse scaly erythematous macules with underlying dyspigmentation - Recommend daily broad spectrum sunscreen SPF 30+ to sun-exposed areas, reapply every 2 hours as needed.  - Staying in the shade or wearing long sleeves, sun glasses (UVA+UVB protection) and wide brim hats (4-inch brim around the entire circumference of the hat) are also recommended for sun protection.  - Call for new or changing lesions.  SKIN CANCER SCREENING PERFORMED TODAY.  Skin tag (2) Anterior neck x 1, posterior neck x 1  IRRTIATED  Destruction of lesion - Anterior neck x 1, posterior neck x 1 Complexity: simple   Destruction method: cryotherapy   Informed consent: discussed and consent obtained   Timeout:  patient  name, date of birth, surgical site, and procedure verified Lesion destroyed using liquid nitrogen: Yes   Region frozen until ice ball extended beyond lesion: Yes   Outcome: patient tolerated procedure well with no complications   Post-procedure details: wound care instructions given    Skin cancer screening  Seborrheic keratosis  Actinic skin damage  Cyst of skin  Lentigo  Melanocytic nevus, unspecified location  Purpura (HCC)   No follow-ups on file.  I, Joanie Coddington, CMA, am acting as scribe for Armida Sans, MD .  Documentation: I have reviewed the above documentation for accuracy and completeness, and I agree with the above.  Armida Sans, MD

## 2022-11-17 ENCOUNTER — Encounter: Payer: Self-pay | Admitting: Dermatology

## 2022-12-28 ENCOUNTER — Other Ambulatory Visit: Payer: Self-pay | Admitting: Family Medicine

## 2022-12-28 DIAGNOSIS — R600 Localized edema: Secondary | ICD-10-CM

## 2022-12-30 ENCOUNTER — Ambulatory Visit
Admission: RE | Admit: 2022-12-30 | Discharge: 2022-12-30 | Disposition: A | Discharge status: Home / Self Care | Payer: Medicare HMO | Source: Ambulatory Visit | Attending: Family Medicine | Admitting: Family Medicine

## 2022-12-30 DIAGNOSIS — R600 Localized edema: Secondary | ICD-10-CM | POA: Insufficient documentation

## 2023-02-01 ENCOUNTER — Encounter: Payer: Self-pay | Admitting: Podiatry

## 2023-02-01 ENCOUNTER — Ambulatory Visit: Payer: Medicare HMO | Admitting: Podiatry

## 2023-02-01 DIAGNOSIS — M722 Plantar fascial fibromatosis: Secondary | ICD-10-CM

## 2023-02-01 DIAGNOSIS — M7662 Achilles tendinitis, left leg: Secondary | ICD-10-CM

## 2023-02-01 MED ORDER — TRIAMCINOLONE ACETONIDE 40 MG/ML IJ SUSP
20.0000 mg | Freq: Once | INTRAMUSCULAR | Status: AC
Start: 2023-02-01 — End: 2023-02-01
  Administered 2023-02-01: 20 mg via INTRAMUSCULAR

## 2023-02-01 MED ORDER — DEXAMETHASONE SODIUM PHOSPHATE 120 MG/30ML IJ SOLN
2.0000 mg | Freq: Once | INTRAMUSCULAR | Status: AC
Start: 2023-02-01 — End: 2023-02-01
  Administered 2023-02-01: 2 mg via INTRA_ARTICULAR

## 2023-02-01 NOTE — Progress Notes (Signed)
Misty Hebert presents today complaining of pain to her left heel.  States that she did 2 camps over the summer and her left foot was swollen and painful and could hardly walk on it.  She states that it hurts on the posterior heel as well as on the plantar heel left.  Objective: Vital signs are stable alert and oriented x 3.  Pulses are palpable.  Neurologic sensorium is intact.  She has pain on palpation of the posterior aspect of her heel and heel where her Achilles attaches to the calcaneus.  She is also having pain on palpation medial calcaneal tubercle of the left heel.  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  And plantar fasciitis left.  Plan: Discussed etiology pathology and surgical therapies discussed appropriate shoe gear stretching exercise ice therapy sugar modifications.  Injected the Achilles area with 2 mg of dexamethasone local anesthetic making sure not to inject into the tendon itself.  I also provided her with a Kenalog injection to her left plantar fascia.  She tolerated these procedures well and I will follow-up with her i on an as-needed basis.

## 2023-02-15 ENCOUNTER — Telehealth: Payer: Self-pay

## 2023-02-15 NOTE — Telephone Encounter (Signed)
Patient contacted office complaining of the cyst (R post shoulder) that was evaluated May 2024. Can she go ahead and schedule excision?

## 2023-02-16 NOTE — Telephone Encounter (Signed)
Patient scheduled. aw 

## 2023-02-16 NOTE — Telephone Encounter (Signed)
Left message for patient to return my call. aw 

## 2023-02-23 ENCOUNTER — Ambulatory Visit: Payer: Medicare HMO | Admitting: Dermatology

## 2023-02-23 VITALS — BP 145/85

## 2023-02-23 DIAGNOSIS — L720 Epidermal cyst: Secondary | ICD-10-CM | POA: Diagnosis not present

## 2023-02-23 DIAGNOSIS — D492 Neoplasm of unspecified behavior of bone, soft tissue, and skin: Secondary | ICD-10-CM

## 2023-02-23 MED ORDER — MUPIROCIN 2 % EX OINT
1.0000 | TOPICAL_OINTMENT | Freq: Every day | CUTANEOUS | 1 refills | Status: DC
Start: 2023-02-23 — End: 2023-10-22

## 2023-02-23 NOTE — Progress Notes (Signed)
   Follow-Up Visit   Subjective  Misty Hebert is a 72 y.o. female who presents for the following: Cyst vs other R post shoulder, pt presents for excision The patient has spots, moles and lesions to be evaluated, some may be new or changing and the patient may have concern these could be cancer.   The following portions of the chart were reviewed this encounter and updated as appropriate: medications, allergies, medical history  Review of Systems:  No other skin or systemic complaints except as noted in HPI or Assessment and Plan.  Objective  Well appearing patient in no apparent distress; mood and affect are within normal limits.   A focused examination was performed of the following areas: R post shoulder  Relevant exam findings are noted in the Assessment and Plan.  R post shoulder Cystic pap 1.5cm    Assessment & Plan     Neoplasm of skin R post shoulder  Skin excision  Lesion length (cm):  1.5 Lesion width (cm):  1.5 Margin per side (cm):  0 Total excision diameter (cm):  1.5 Informed consent: discussed and consent obtained   Timeout: patient name, date of birth, surgical site, and procedure verified   Procedure prep:  Patient was prepped and draped in usual sterile fashion Prep type:  Isopropyl alcohol and povidone-iodine Anesthesia: the lesion was anesthetized in a standard fashion   Anesthetic:  1% lidocaine w/ epinephrine 1-100,000 buffered w/ 8.4% NaHCO3 (6cc lido w/ epi, 3cc bupicicaine, Total of 9cc) Instrument used: #15 blade   Hemostasis achieved with: pressure   Hemostasis achieved with comment:  Electrocautery Outcome: patient tolerated procedure well with no complications   Post-procedure details: sterile dressing applied and wound care instructions given   Dressing type: bandage, pressure dressing and bacitracin (Mupirocin)    Skin repair Complexity:  Complex Final length (cm):  3 Reason for type of repair: reduce tension to allow closure,  reduce the risk of dehiscence, infection, and necrosis, reduce subcutaneous dead space and avoid a hematoma, allow closure of the large defect, preserve normal anatomy, preserve normal anatomical and functional relationships and enhance both functionality and cosmetic results   Undermining: area extensively undermined   Undermining comment:  Undermining Defect 1.5cm Subcutaneous layers (deep stitches):  Suture size:  3-0 Suture type: Vicryl (polyglactin 910)   Subcutaneous suture technique: Inverted Dermal. Fine/surface layer approximation (top stitches):  Suture size:  3-0 Suture type: nylon   Stitches: horizontal mattress   Suture removal (days):  7 Hemostasis achieved with: pressure Outcome: patient tolerated procedure well with no complications   Post-procedure details: sterile dressing applied and wound care instructions given   Dressing type: bandage, pressure dressing and bacitracin (Mupirocin)    mupirocin ointment (BACTROBAN) 2 % Apply 1 Application topically daily. Qd to excision site  Specimen 1 - Surgical pathology Differential Diagnosis: D48.5 Cyst vs other  Check Margins: No Cystic pap 1.5cm  Cyst vs other excised today Start Mupirocin ont qd to excision site    Return in about 1 week (around 03/02/2023) for suture removal.  I, Ardis Rowan, RMA, am acting as scribe for Armida Sans, MD .    Documentation: I have reviewed the above documentation for accuracy and completeness, and I agree with the above.  Armida Sans, MD

## 2023-02-23 NOTE — Patient Instructions (Addendum)
 Wound Care Instructions  On the day following your surgery, you should begin doing daily dressing changes: Remove the old dressing and discard it. Cleanse the wound gently with tap water. This may be done in the shower or by placing a wet gauze pad directly on the wound and letting it soak for several minutes. It is important to gently remove any dried blood from the wound in order to encourage healing. This may be done by gently rolling a moistened Q-tip on the dried blood. Do not pick at the wound. If the wound should start to bleed, continue cleaning the wound, then place a moist gauze pad on the wound and hold pressure for a few minutes.  Make sure you then dry the skin surrounding the wound completely or the tape will not stick to the skin. Do not use cotton balls on the wound. After the wound is clean and dry, apply the ointment gently with a Q-tip. Cut a non-stick pad to fit the size of the wound. Lay the pad flush to the wound. If the wound is draining, you may want to reinforce it with a small amount of gauze on top of the non-stick pad for a little added compression to the area. Use the tape to seal the area completely. Select from the following with respect to your individual situation: If your wound has been stitched closed: continue the above steps 1-8 at least daily until your sutures are removed. If your wound has been left open to heal: continue steps 1-8 at least daily for the first 3-4 weeks. We would like for you to take a few extra precautions for at least the next week. Sleep with your head elevated on pillows if our wound is on your head. Do not bend over or lift heavy items to reduce the chance of elevated blood pressure to the wound Do not participate in particularly strenuous activities.   Below is a list of dressing supplies you might need.  Cotton-tipped applicators - Q-tips Gauze pads (2x2 and/or 4x4) - All-Purpose Sponges Non-stick dressing material - Telfa Tape -  Paper or Hypafix New and clean tube of petroleum jelly - Vaseline    Comments on Post-Operative Period Slight swelling and redness often appear around the wound. This is normal and will disappear within several days following the surgery. The healing wound will drain a brownish-red-yellow discharge during healing. This is a normal phase of wound healing. As the wound begins to heal, the drainage may increase in amount. Again, this drainage is normal. Notify us if the drainage becomes persistently bloody, excessively swollen, or intensely painful or develops a foul odor or red streaks.  If you should experience mild discomfort during the healing phase, you may take an aspirin-free medication such as Tylenol (acetaminophen). Notify us if the discomfort is severe or persistent. Avoid alcoholic beverages when taking pain medicine.  In Case of Wound Hemorrhage A wound hemorrhage is when the bandage suddenly becomes soaked with bright red blood and flows profusely. If this happens, sit down or lie down with your head elevated. If the wound has a dressing on it, do not remove the dressing. Apply pressure to the existing gauze. If the wound is not covered, use a gauze pad to apply pressure and continue applying the pressure for 20 minutes without peeking. DO NOT COVER THE WOUND WITH A LARGE TOWEL OR WASH CLOTH. Release your hand from the wound site but do not remove the dressing. If the bleeding has stopped,  gently clean around the wound. Leave the dressing in place for 24 hours if possible. This wait time allows the blood vessels to close off so that you do not spark a new round of bleeding by disrupting the newly clotted blood vessels with an immediate dressing change. If the bleeding does not subside, continue to hold pressure. If matters are out of your control, contact an After Hours clinic or go to the Emergency Room.    Due to recent changes in healthcare laws, you may see results of your pathology  and/or laboratory studies on MyChart before the doctors have had a chance to review them. We understand that in some cases there may be results that are confusing or concerning to you. Please understand that not all results are received at the same time and often the doctors may need to interpret multiple results in order to provide you with the best plan of care or course of treatment. Therefore, we ask that you please give [place] 2 business days to thoroughly review all your results before contacting the office for clarification. Should we see a critical lab result, you will be contacted sooner.   If You Need Anything After Your Visit  If you have any questions or concerns for your doctor, please call our main line at [phone] and press option 4 to reach your doctor's medical assistant. If no one answers, please leave a voicemail as directed and we will return your call as soon as possible. Messages left after 4 pm will be answered the following business day.   You may also send [place] a message via MyChart. We typically respond to MyChart messages within 1-2 business days.  For prescription refills, please ask your pharmacy to contact our office. Our fax number is [phone].  If you have an urgent issue when the clinic is closed that cannot wait until the next business day, you can page your doctor at the number below.    Please note that while we do our best to be available for urgent issues outside of office hours, we are not available 24/7.   If you have an urgent issue and are unable to reach [place], you may choose to seek medical care at your doctor's office, retail clinic, urgent care center, or emergency room.  If you have a medical emergency, please immediately call 911 or go to the emergency department.  Pager Numbers  - Dr. Gwen Pounds: [phone]  - Dr. Roseanne Reno: [phone]  - Dr. Katrinka Blazing: [phone]   In the event of inclement weather, please call our main line at [phone]  for an update on the status of any delays or closures.  Dermatology Medication Tips: Please keep the boxes that topical medications come in in order to help keep track of the instructions about where and how to use these. Pharmacies typically print the medication instructions only on the boxes and not directly on the medication tubes.   If your medication is too expensive, please contact our office at [phone] option 4 or send [place] a message through MyChart.   We are unable to tell what your co-pay for medications will be in advance as this is different depending on your insurance coverage. However, we may be able to find a substitute medication at lower cost or fill out paperwork to get insurance to cover a needed medication.   If a prior authorization is required to get your medication covered by your insurance company, please allow [place] 1-2 business days to complete this  process.  Drug prices often vary depending on where the prescription is filled and some pharmacies may offer cheaper prices.  The website www.goodrx.com contains coupons for medications through different pharmacies. The prices here do not account for what the cost may be with help from insurance (it may be cheaper with your insurance), but the website can give you the price if you did not use any insurance.  - You can print the associated coupon and take it with your prescription to the pharmacy.  - You may also stop by our office during regular business hours and pick up a GoodRx coupon card.  - If you need your prescription sent electronically to a different pharmacy, notify our office through [facility] or by phone at [phone] option 4.     Si Usted Necesita Algo Despus de Su Visita  Tambin puede enviarnos un mensaje a travs de Clinical cytogeneticist. Por lo general respondemos a los mensajes de MyChart en el transcurso de 1 a 2 das hbiles.  Para renovar recetas, por favor pida a su farmacia que se ponga en contacto  con nuestra oficina. Annie Sable de fax es [place] [phone].  Si tiene un asunto urgente cuando la clnica est cerrada y que no puede esperar hasta el siguiente da hbil, puede llamar/localizar a su doctor(a) al nmero que aparece a continuacin.   Por favor, tenga en cuenta que aunque hacemos todo lo posible para estar disponibles para asuntos urgentes fuera del horario de [place], no estamos disponibles las 24 horas del da, los 7 [address]  [address] de la [place].   Si tiene un problema urgente y no puede comunicarse con nosotros, puede optar por buscar atencin mdica  en el consultorio de su doctor(a), en una clnica privada, en un centro de atencin urgente o en una sala de emergencias.  Si tiene Engineer, drilling, por favor llame inmediatamente al 911 o vaya a la sala de emergencias.  Nmeros de bper  - Dr. Gwen Pounds: [phone]  - Dra. Roseanne Reno: [phone]  - Dr. Katrinka Blazing: [phone]   En caso de inclemencias del tiempo, por favor llame a Lacy Duverney principal al [phone] para una actualizacin sobre el [place] de cualquier retraso o cierre.  Consejos para la medicacin en dermatologa: Por favor, guarde las cajas en las que vienen los medicamentos de uso tpico para ayudarle a seguir las instrucciones sobre dnde y cmo usarlos. Las farmacias generalmente imprimen las instrucciones del medicamento slo en las cajas y no directamente en los tubos del [place].   Si su medicamento es muy caro, por favor, pngase en contacto con Rolm Gala llamando al [phone] y presione la opcin 4 o envenos un mensaje a travs de Clinical cytogeneticist.   No podemos decirle cul ser su copago por los medicamentos por adelantado ya que esto es diferente dependiendo de la cobertura de su seguro. Sin embargo, es posible que podamos encontrar un medicamento sustituto a Audiological scientist un formulario para que el seguro cubra el medicamento que se considera necesario.   Si se requiere una autorizacin  previa para que su compaa de seguros [place] su medicamento, por favor permtanos de 1 a 2 das hbiles para completar [address]  Los precios de los medicamentos varan con frecuencia dependiendo del Environmental consultant de dnde se surte la receta y alguna farmacias pueden ofrecer precios ms baratos.  El sitio web www.goodrx.com tiene cupones para medicamentos de Health and safety inspector. Los precios aqu no tienen en cuenta lo que podra costar con la ayuda del seguro (  puede ser ms barato con su seguro), pero el sitio web puede darle el precio si no Visual merchandiser.  - Puede imprimir el cupn correspondiente y llevarlo con su receta a la farmacia.  - Tambin puede pasar por nuestra oficina durante el horario de atencin regular y Education officer, museum una tarjeta de cupones de GoodRx.  - Si necesita que su receta se enve electrnicamente a una farmacia diferente, informe a nuestra oficina a travs de MyChart de [place] o por telfono llamando al [phone] y presione la opcin 4.

## 2023-02-24 ENCOUNTER — Telehealth: Payer: Self-pay

## 2023-02-24 NOTE — Telephone Encounter (Signed)
Patient doing fine after yesterdays surgery.  She has had a little discomfort but not enough to take any tylenol/ibuprofen.  Discussed pain regimen.  Advised pt to call if any problems./sh

## 2023-03-01 ENCOUNTER — Encounter: Payer: Self-pay | Admitting: Dermatology

## 2023-03-02 ENCOUNTER — Encounter: Payer: Self-pay | Admitting: Dermatology

## 2023-03-02 ENCOUNTER — Ambulatory Visit: Payer: Medicare HMO | Admitting: Dermatology

## 2023-03-02 DIAGNOSIS — Z9889 Other specified postprocedural states: Secondary | ICD-10-CM

## 2023-03-02 DIAGNOSIS — L729 Follicular cyst of the skin and subcutaneous tissue, unspecified: Secondary | ICD-10-CM

## 2023-03-02 MED ORDER — DOXYCYCLINE MONOHYDRATE 100 MG PO CAPS: 100.0000 mg | ORAL_CAPSULE | Freq: Two times a day (BID) | ORAL | 0 refills | Status: AC

## 2023-03-02 NOTE — Patient Instructions (Addendum)
Start Doxycycline 100 mg 1 capsule twice daily with food for 10 days.   Doxycycline should be taken with food to prevent nausea. Do not lay down for 30 minutes after taking. Be cautious with sun exposure and use good sun protection while on this medication. Pregnant women should not take this medication.    Due to recent changes in healthcare laws, you may see results of your pathology and/or laboratory studies on MyChart before the doctors have had a chance to review them. We understand that in some cases there may be results that are confusing or concerning to you. Please understand that not all results are received at the same time and often the doctors may need to interpret multiple results in order to provide you with the best plan of care or course of treatment. Therefore, we ask that you please give [place] 2 business days to thoroughly review all your results before contacting the office for clarification. Should we see a critical lab result, you will be contacted sooner.   If You Need Anything After Your Visit  If you have any questions or concerns for your doctor, please call our main line at [phone] and press option 4 to reach your doctor's medical assistant. If no one answers, please leave a voicemail as directed and we will return your call as soon as possible. Messages left after 4 pm will be answered the following business day.   You may also send [place] a message via MyChart. We typically respond to MyChart messages within 1-2 business days.  For prescription refills, please ask your pharmacy to contact our office. Our fax number is [phone].  If you have an urgent issue when the clinic is closed that cannot wait until the next business day, you can page your doctor at the number below.    Please note that while we do our best to be available for urgent issues outside of office hours, we are not available 24/7.   If you have an urgent issue and are unable to reach [place], you may  choose to seek medical care at your doctor's office, retail clinic, urgent care center, or emergency room.  If you have a medical emergency, please immediately call 911 or go to the emergency department.  Pager Numbers  - Dr. Gwen Pounds: [phone]  - Dr. Roseanne Reno: [phone]  - Dr. Katrinka Blazing: [phone]   In the event of inclement weather, please call our main line at [phone] for an update on the status of any delays or closures.  Dermatology Medication Tips: Please keep the boxes that topical medications come in in order to help keep track of the instructions about where and how to use these. Pharmacies typically print the medication instructions only on the boxes and not directly on the medication tubes.   If your medication is too expensive, please contact our office at [phone] option 4 or send [place] a message through MyChart.   We are unable to tell what your co-pay for medications will be in advance as this is different depending on your insurance coverage. However, we may be able to find a substitute medication at lower cost or fill out paperwork to get insurance to cover a needed medication.   If a prior authorization is required to get your medication covered by your insurance company, please allow [place] 1-2 business days to complete this process.  Drug prices often vary depending on where the prescription is filled and some pharmacies may offer cheaper prices.  The website  www.goodrx.com contains coupons for medications through different pharmacies. The prices here do not account for what the cost may be with help from insurance (it may be cheaper with your insurance), but the website can give you the price if you did not use any insurance.  - You can print the associated coupon and take it with your prescription to the pharmacy.  - You may also stop by our office during regular business hours and pick up a GoodRx coupon card.  - If you need your prescription sent  electronically to a different pharmacy, notify our office through [facility] or by phone at [phone] option 4.     Si Usted Necesita Algo Despus de Su Visita  Tambin puede enviarnos un mensaje a travs de Clinical cytogeneticist. Por lo general respondemos a los mensajes de MyChart en el transcurso de 1 a 2 das hbiles.  Para renovar recetas, por favor pida a su farmacia que se ponga en contacto con nuestra oficina. Annie Sable de fax es [place] [phone].  Si tiene un asunto urgente cuando la clnica est cerrada y que no puede esperar hasta el siguiente da hbil, puede llamar/localizar a su doctor(a) al nmero que aparece a continuacin.   Por favor, tenga en cuenta que aunque hacemos todo lo posible para estar disponibles para asuntos urgentes fuera del horario de [place], no estamos disponibles las 24 horas del da, los 7 [address]  [address] de la [place].   Si tiene un problema urgente y no puede comunicarse con nosotros, puede optar por buscar atencin mdica  en el consultorio de su doctor(a), en una clnica privada, en un centro de atencin urgente o en una sala de emergencias.  Si tiene Engineer, drilling, por favor llame inmediatamente al 911 o vaya a la sala de emergencias.  Nmeros de bper  - Dr. Gwen Pounds: [phone]  - Dra. Roseanne Reno: [phone]  - Dr. Katrinka Blazing: [phone]   En caso de inclemencias del tiempo, por favor llame a Lacy Duverney principal al [phone] para una actualizacin sobre el [place] de cualquier retraso o cierre.  Consejos para la medicacin en dermatologa: Por favor, guarde las cajas en las que vienen los medicamentos de uso tpico para ayudarle a seguir las instrucciones sobre dnde y cmo usarlos. Las farmacias generalmente imprimen las instrucciones del medicamento slo en las cajas y no directamente en los tubos del [place].   Si su medicamento es muy caro, por favor, pngase en contacto con Rolm Gala llamando al [phone] y presione la  opcin 4 o envenos un mensaje a travs de Clinical cytogeneticist.   No podemos decirle cul ser su copago por los medicamentos por adelantado ya que esto es diferente dependiendo de la cobertura de su seguro. Sin embargo, es posible que podamos encontrar un medicamento sustituto a Audiological scientist un formulario para que el seguro cubra el medicamento que se considera necesario.   Si se requiere una autorizacin previa para que su compaa de seguros [place] su medicamento, por favor permtanos de 1 a 2 das hbiles para completar [address]  Los precios de los medicamentos varan con frecuencia dependiendo del Environmental consultant de dnde se surte la receta y alguna farmacias pueden ofrecer precios ms baratos.  El sitio web www.goodrx.com tiene cupones para medicamentos de Health and safety inspector. Los precios aqu no tienen en cuenta lo que podra costar con la ayuda del seguro (puede ser ms barato con su seguro), pero el sitio web puede darle el precio si no utiliz Tourist information centre manager.  - Puede  imprimir el cupn correspondiente y llevarlo con su receta a la farmacia.  - Tambin puede pasar por nuestra oficina durante el horario de atencin regular y Education officer, museum una tarjeta de cupones de GoodRx.  - Si necesita que su receta se enve electrnicamente a una farmacia diferente, informe a nuestra oficina a travs de MyChart de [place] o por telfono llamando al [phone] y presione la opcin 4.

## 2023-03-02 NOTE — Progress Notes (Signed)
   Follow-Up Visit   Subjective  Misty Hebert is a 72 y.o. female who presents for the following: Suture removal. Patient reports area has been sore the entire time. Has been using Mupirocin.   Pathology showed Epidermal inclusion cyst  The following portions of the chart were reviewed this encounter and updated as appropriate: medications, allergies, medical history  Review of Systems:  No other skin or systemic complaints except as noted in HPI or Assessment and Plan.  Objective  Well appearing patient in no apparent distress; mood and affect are within normal limits.  Areas Examined: Right posterior shoulder Relevant physical exam findings are noted in the Assessment and Plan.    Assessment & Plan    Encounter for Removal of Sutures - Incision site is clean, dry and intact with surrounding inflammation.  - Wound cleansed, sutures removed, wound cleansed and steri strips applied.  - Discussed pathology results showing EIC - Patient advised to keep steri-strips dry until they fall off. - Scars remodel for a full year. - Once steri-strips fall off, patient can apply over-the-counter silicone scar cream once to twice a day to help with scar remodeling if desired. - Patient advised to call with any concerns or if they notice any new or changing lesions.  Start Doxycycline 100 mg 1 capsule twice daily with food for 10 days.   Doxycycline should be taken with food to prevent nausea. Do not lay down for 30 minutes after taking. Be cautious with sun exposure and use good sun protection while on this medication. Pregnant women should not take this medication.    Return for TBSE As Scheduled.  I, Lawson Radar, CMA, am acting as scribe for Armida Sans, MD.   Documentation: I have reviewed the above documentation for accuracy and completeness, and I agree with the above.  Armida Sans, MD

## 2023-03-26 ENCOUNTER — Other Ambulatory Visit: Payer: Self-pay | Admitting: Internal Medicine

## 2023-03-26 DIAGNOSIS — I10 Essential (primary) hypertension: Secondary | ICD-10-CM

## 2023-03-26 NOTE — Telephone Encounter (Signed)
Please contact pt for future appointment. Pt overdue for f/u. Pt needing refills.

## 2023-03-26 NOTE — Telephone Encounter (Signed)
Left voicemail to schedule follow up appt

## 2023-03-29 ENCOUNTER — Other Ambulatory Visit: Payer: Self-pay | Admitting: Family Medicine

## 2023-03-29 DIAGNOSIS — Z1231 Encounter for screening mammogram for malignant neoplasm of breast: Secondary | ICD-10-CM

## 2023-04-07 ENCOUNTER — Ambulatory Visit
Admission: RE | Admit: 2023-04-07 | Discharge: 2023-04-07 | Disposition: A | Discharge status: Home / Self Care | Payer: Medicare HMO | Source: Ambulatory Visit | Attending: Family Medicine | Admitting: Family Medicine

## 2023-04-07 DIAGNOSIS — Z1231 Encounter for screening mammogram for malignant neoplasm of breast: Secondary | ICD-10-CM

## 2023-04-18 NOTE — Progress Notes (Unsigned)
Cardiology Clinic Note   Date: 04/19/2023 ID: JOSSLIN DALKE, DOB June 24, 1951, MRN [id number]  Primary Cardiologist:  Yvonne Kendall, MD  Patient Profile    Misty Hebert is a 72 y.o. female who presents to the clinic today for routine follow up.     Past medical history significant for: Nonobstructive CAD. Coronary CTA 12/28/2019: Coronary calcium score 0.  Minimal nonobstructive CAD. Chronic HFpEF. Hypertension. Hyperlipidemia. Lipid panel 12/28/2022: LDL 166, HDL 39, TG 201, total 245. CVA. DVT. Venous ultrasound 07/13/2012: Positive for DVT distal superficial femoral vein and popliteal vein right leg. Venous ultrasound 12/30/2022: No evidence of femoral-popliteal DVT or superficial thrombophlebitis left lower extremity. Asthma. GERD. T2DM. Fibromyalgia.     History of Present Illness    Misty Hebert is followed by Dr. Okey Dupre for the above outlined history.  In summary, patient was first evaluated by Dr. Okey Dupre on 04/28/2016 for chest pain at the request of Dr. Dayna Barker.  Patient had been evaluated in the ED with negative troponin and EKG showing poor R wave progression across precordial leads.  Vascular evaluations including 4 catheterizations.  Prior records reviewed demonstrating no significant coronary disease in 1994 and 2000.  Normal exercise stress test in 2015.  History of at least 3 lower extremity DVTs with last episode 4 years prior treated with 6 months of Xarelto.  She did have a negative hypercoagulable workup.  In June 2021 she continued to complain of the same atypical chest pain symptoms.  She reported some improvement in symptoms with pantoprazole but was concerned about the impact on her kidneys so she stopped it.  She underwent coronary CTA which showed coronary calcium score of 0 with minimal nonobstructive CAD.  Patient was last seen in the office by Dr. Okey Dupre on 04/29/2022 for routine follow-up.  She was doing well at that time and no changes were  made.  Discussed the use of AI scribe software for clinical note transcription with the patient, who gave verbal consent to proceed.  The patient presents for routine follow-up. She continues with chronic sharp central chest pain that sometimes radiates to the back. The pain has been consistent for years and can last from a few minutes to longer periods. Despite extensive cardiac workup, including four catheterizations, two nuclear stress tests, and a coronary CTA, no cardiac cause has been identified. The patient manages the pain by trying to relax until pain resolves. She has prn NTG but has not used it. 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 Her activity is limited secondary to right hip pain. She had her hip replaced but continued to have pain. She then underwent IT band release and bursa removal but pain continues. She gets occasional hip injections with temporary relief.          ROS: All other systems reviewed and are otherwise negative except as noted in History of Present Illness.  Studies Reviewed    EKG Interpretation Date/Time:  Monday April 19 2023 11:15:54 EDT Ventricular Rate:  65 PR Interval:  140 QRS Duration:  76 QT Interval:  410 QTC Calculation: 426 R Axis:   -33  Text Interpretation: Normal sinus rhythm Left axis deviation Minimal voltage criteria for LVH, may be normal variant ( R in aVL ) When compared with ECG of 04/29/2022 (not in [facility]) No significant change was found Confirmed by Carlos Levering [phone]) on  04/19/2023 11:28:49 AM           Physical Exam    VS:  BP 124/86   Pulse 65   Ht 5\' 3"  (1.6 m)   Wt 222 lb (100.7 kg)   LMP  (LMP Unknown)   SpO2 98%   BMI 39.33 kg/m  , BMI Body mass index is 39.33 kg/m.  GEN: Well nourished, well developed, in no acute distress. Neck: No JVD or carotid bruits. Cardiac:  RRR. No murmurs. No rubs or gallops.    Respiratory:  Respirations regular and unlabored. Clear to auscultation without rales, wheezing or rhonchi. GI: Soft, nontender, nondistended. Extremities: Radials/DP/PT 2+ and equal bilaterally. No clubbing or cyanosis. No edema.  Skin: Warm and dry, no rash. Neuro: Strength intact.  Assessment & Plan      Chronic Chest Pain Recurrent sharp chest pain, unchanged in quality for years. Coronary CTA in 2021 showed no calcium. Pain is suspected to be due to spasms. Patient manages pain by relaxation and has not required nitroglycerin. -Renew nitroglycerin prescription and advise patient to consider using during prolonged episodes of chest pain to assess response.  Deep Vein Thrombosis (DVT) History of multiple DVTs. No hypercoagulability identified on workup. -Continue Eliquis  Chronic HFpEF.  Recurrent edema, particularly in the heat and with prolonged standing. Patient manages with elevation. Euvolemic and well compensated today. -Consider use of compression socks during long car rides and prolonged standing.  Hypertension BP today 126/84.  -Continue carvedilol, hydrochlorothiazide.   Hyperlipidemia LDL June 2024 166. -Continue pravastatin.  -Managed by PCP.      Disposition: Return in 1 year or sooner as needed.          Signed, Etta Grandchild. Jarrah Babich, DNP, NP-C

## 2023-04-19 ENCOUNTER — Ambulatory Visit: Payer: Medicare HMO | Attending: Medical | Admitting: Student

## 2023-04-19 ENCOUNTER — Other Ambulatory Visit: Payer: Self-pay | Admitting: Internal Medicine

## 2023-04-19 ENCOUNTER — Encounter: Payer: Self-pay | Admitting: Student

## 2023-04-19 VITALS — BP 124/86 | HR 65 | Ht 63.0 in | Wt 222.0 lb

## 2023-04-19 DIAGNOSIS — G8929 Other chronic pain: Secondary | ICD-10-CM

## 2023-04-19 DIAGNOSIS — R079 Chest pain, unspecified: Secondary | ICD-10-CM | POA: Diagnosis not present

## 2023-04-19 DIAGNOSIS — I10 Essential (primary) hypertension: Secondary | ICD-10-CM

## 2023-04-19 DIAGNOSIS — Z86718 Personal history of other venous thrombosis and embolism: Secondary | ICD-10-CM | POA: Diagnosis not present

## 2023-04-19 DIAGNOSIS — I5032 Chronic diastolic (congestive) heart failure: Secondary | ICD-10-CM

## 2023-04-19 DIAGNOSIS — E782 Mixed hyperlipidemia: Secondary | ICD-10-CM

## 2023-04-19 MED ORDER — NITROGLYCERIN 0.4 MG SL SUBL: 0.4000 mg | SUBLINGUAL_TABLET | SUBLINGUAL | 2 refills | Status: AC | PRN

## 2023-04-19 NOTE — Patient Instructions (Signed)
Medication Instructions:  Your Physician recommend you continue on your current medication as directed.    *If you need a refill on your cardiac medications before your next appointment, please call your pharmacy*   Follow-Up: At [facility], you and your health needs are our priority.  As part of our continuing mission to provide you with exceptional heart care, we have created designated Provider Care Teams.  These Care Teams include your primary Cardiologist (physician) and Advanced Practice Providers (APPs -  Physician Assistants and Nurse Practitioners) who all work together to provide you with the care you need, when you need it.  We recommend signing up for the patient portal called "MyChart".  Sign up information is provided on this After Visit Summary.  MyChart is used to connect with patients for Virtual Visits (Telemedicine).  Patients are able to view lab/test results, encounter notes, upcoming appointments, etc.  Non-urgent messages can be sent to your provider as well.   To learn more about what you can do with MyChart, go to [email or web address].    Your next appointment:   1 year(s)  Provider:   You may see Yvonne Kendall, MD or one of the following Advanced Practice Providers on your designated Care Team:   Nicolasa Ducking, NP Eula Listen, PA-C Cadence Fransico Michael, PA-C Charlsie Quest, NP

## 2023-04-26 ENCOUNTER — Ambulatory Visit: Payer: Medicare HMO | Admitting: Medical

## 2023-04-29 ENCOUNTER — Encounter (INDEPENDENT_AMBULATORY_CARE_PROVIDER_SITE_OTHER): Payer: Medicare HMO | Admitting: Nurse Practitioner

## 2023-06-25 ENCOUNTER — Encounter (INDEPENDENT_AMBULATORY_CARE_PROVIDER_SITE_OTHER): Payer: Self-pay | Admitting: Nurse Practitioner

## 2023-08-05 IMAGING — MG MM DIGITAL SCREENING BILAT W/ TOMO AND CAD
6 of 12 series · 6 of 36 positions shown · non-contrast
Comparison: Previous exam(s).

CLINICAL DATA: Screening.

EXAM:
DIGITAL SCREENING BILATERAL MAMMOGRAM WITH TOMOSYNTHESIS AND CAD
TECHNIQUE: Bilateral screening digital craniocaudal and mediolateral oblique
mammograms were obtained. Bilateral screening digital breast
tomosynthesis was performed. The images were evaluated with
computer-aided detection.

[L CC synth-2D]
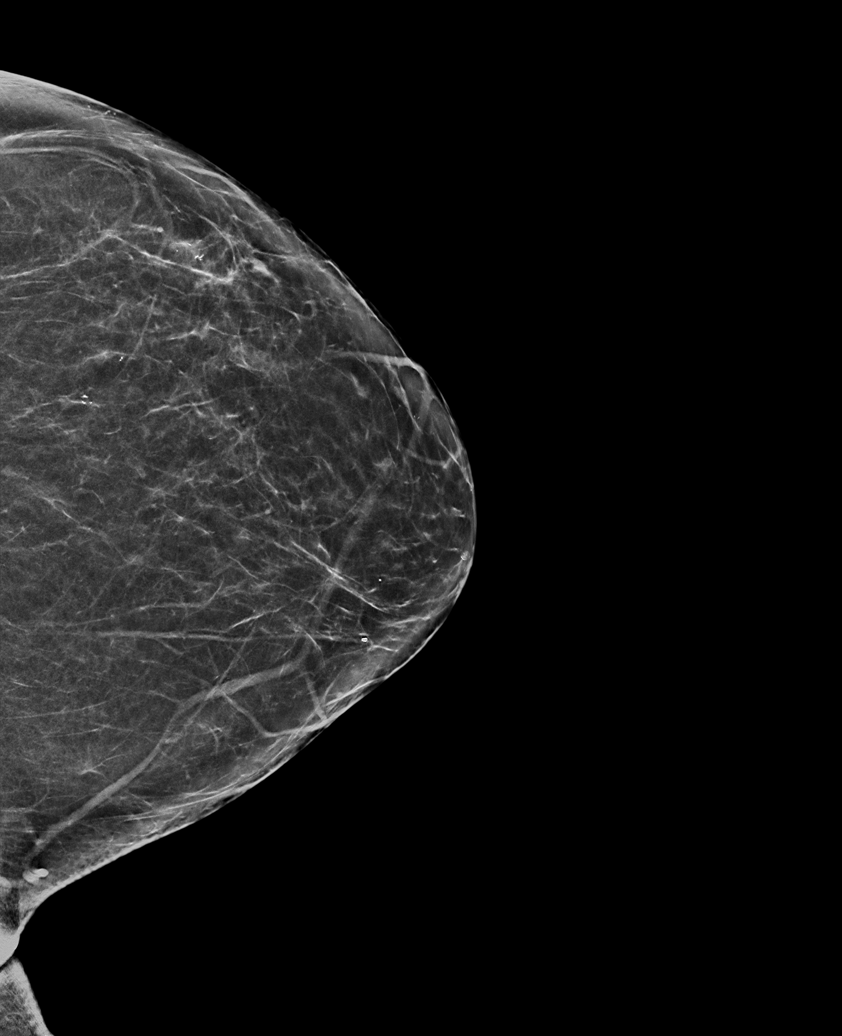

[R MLO synth-2D (1 of 2)]
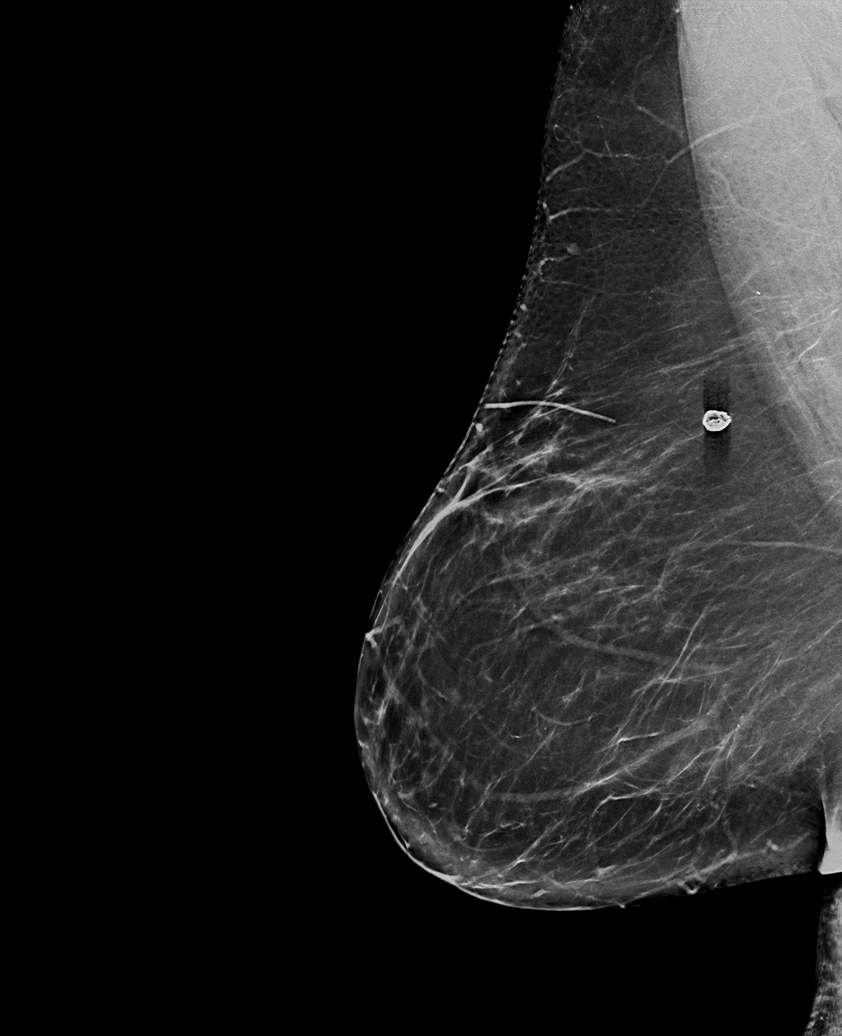

[R CC synth-2D]
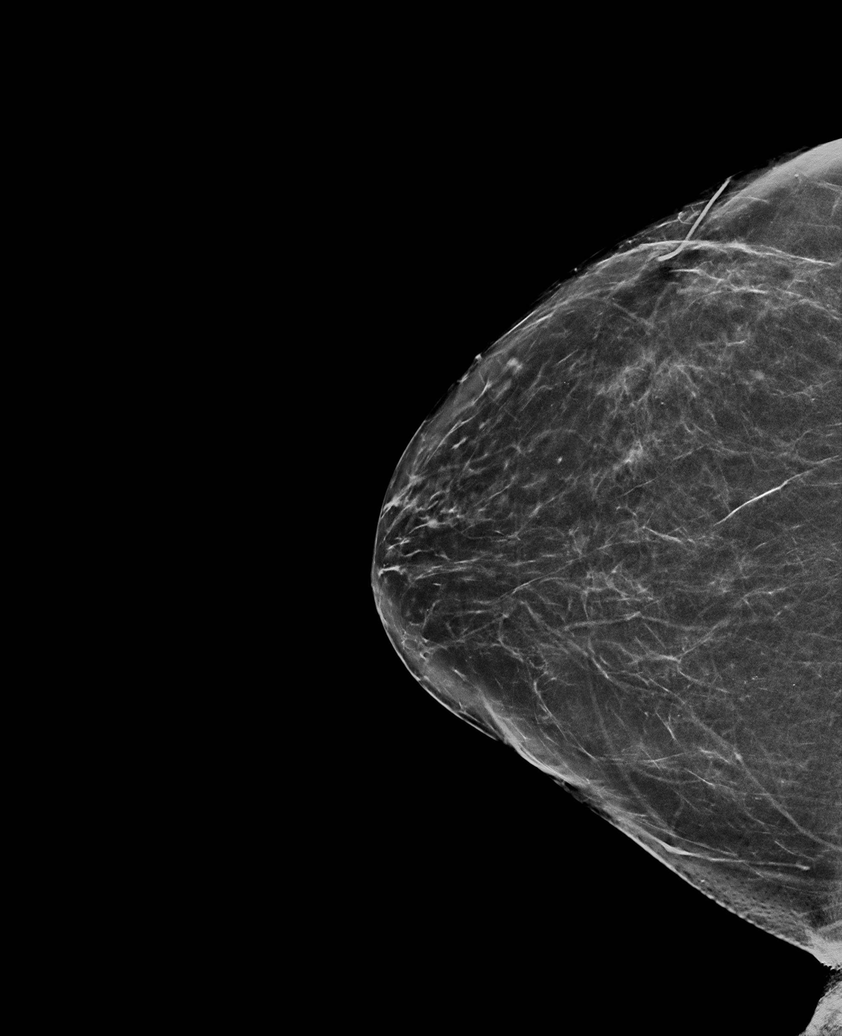

[L MLO synth-2D (1 of 2)]
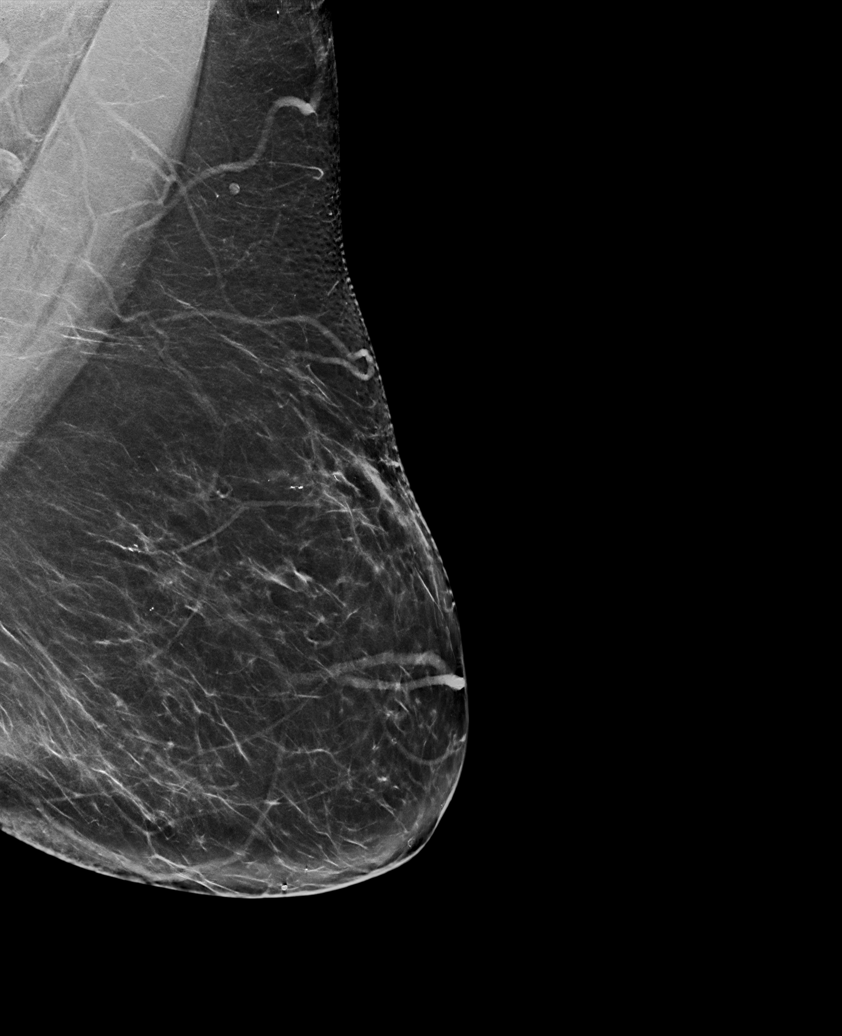

[R MLO synth-2D (2 of 2)]
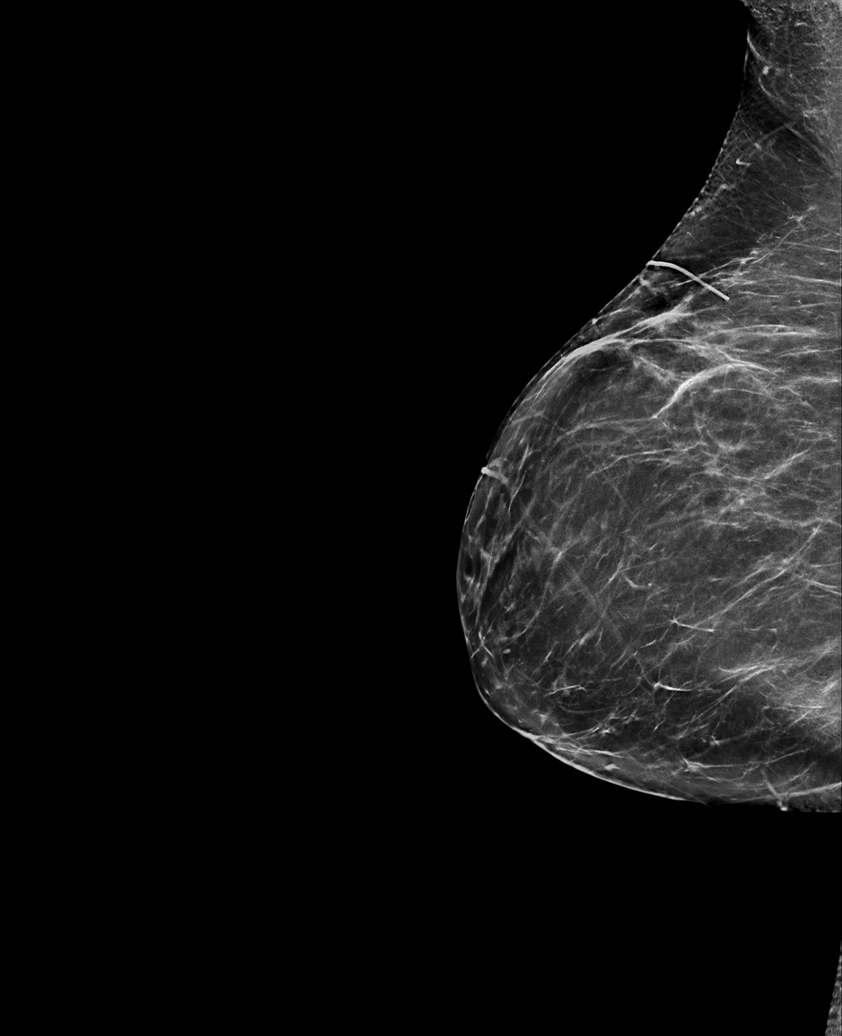

[L MLO synth-2D (2 of 2)]
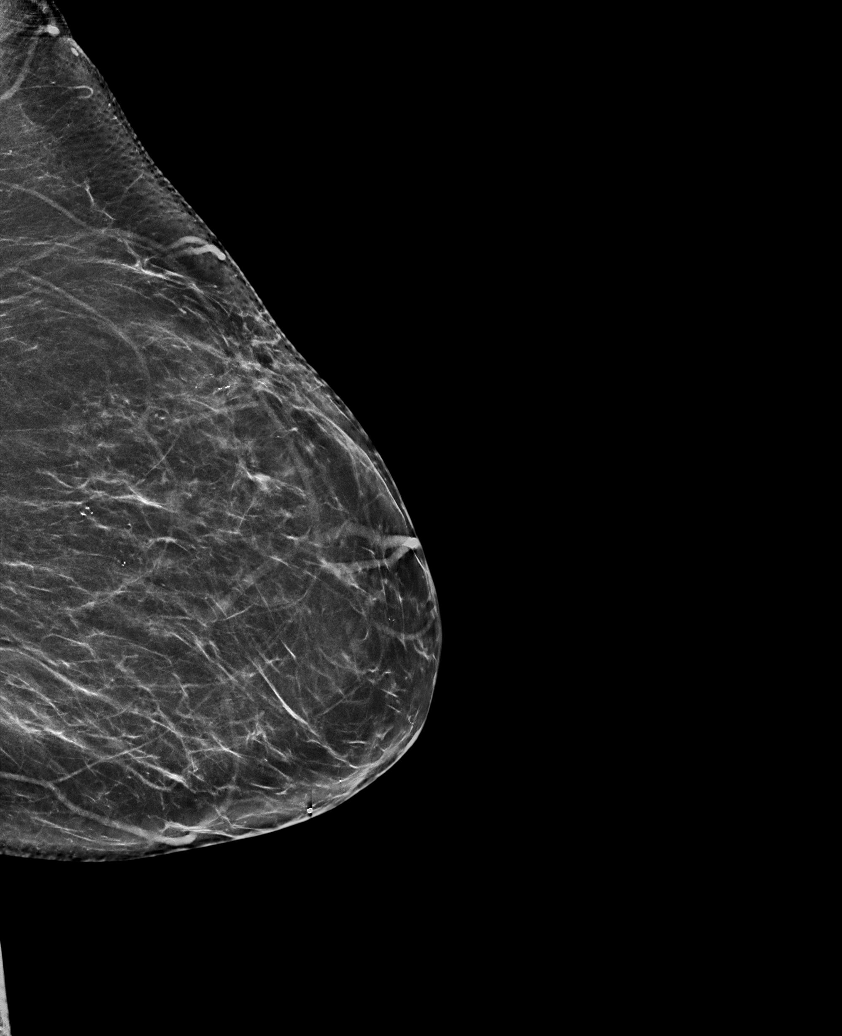

[6 of 36 positions shown; findings below may reference images not displayed]

ACR Breast Density Category b: There are scattered areas of
fibroglandular density.
FINDINGS: In the left breast, calcifications in the upper outer breast at
middle depth warrant further evaluation. In the right breast, no
findings suspicious for malignancy.
IMPRESSION: Further evaluation is suggested for calcifications in the left
breast.

RECOMMENDATION:
Diagnostic mammogram of the left breast. (Code:[id number])

The patient will be contacted regarding the findings, and additional
imaging will be scheduled.

BI-RADS CATEGORY  0: Incomplete. Need additional imaging evaluation
and/or prior mammograms for comparison.

## 2023-10-18 ENCOUNTER — Emergency Department

## 2023-10-18 ENCOUNTER — Other Ambulatory Visit: Payer: Self-pay

## 2023-10-18 ENCOUNTER — Inpatient Hospital Stay
Admission: EM | Admit: 2023-10-18 | Discharge: 2023-10-22 | DRG: 164 | Disposition: A | Discharge status: Home Health | Attending: Internal Medicine | Admitting: Internal Medicine

## 2023-10-18 DIAGNOSIS — E66813 Obesity, class 3: Secondary | ICD-10-CM | POA: Diagnosis present

## 2023-10-18 DIAGNOSIS — Z888 Allergy status to other drugs, medicaments and biological substances status: Secondary | ICD-10-CM

## 2023-10-18 DIAGNOSIS — E785 Hyperlipidemia, unspecified: Secondary | ICD-10-CM | POA: Diagnosis present

## 2023-10-18 DIAGNOSIS — N3281 Overactive bladder: Secondary | ICD-10-CM | POA: Diagnosis present

## 2023-10-18 DIAGNOSIS — Z6841 Body Mass Index (BMI) 40.0 and over, adult: Secondary | ICD-10-CM | POA: Diagnosis not present

## 2023-10-18 DIAGNOSIS — M797 Fibromyalgia: Secondary | ICD-10-CM | POA: Diagnosis present

## 2023-10-18 DIAGNOSIS — Z7985 Long-term (current) use of injectable non-insulin antidiabetic drugs: Secondary | ICD-10-CM | POA: Diagnosis not present

## 2023-10-18 DIAGNOSIS — I10 Essential (primary) hypertension: Secondary | ICD-10-CM | POA: Diagnosis not present

## 2023-10-18 DIAGNOSIS — I82431 Acute embolism and thrombosis of right popliteal vein: Secondary | ICD-10-CM | POA: Diagnosis present

## 2023-10-18 DIAGNOSIS — Z882 Allergy status to sulfonamides status: Secondary | ICD-10-CM | POA: Diagnosis not present

## 2023-10-18 DIAGNOSIS — I2699 Other pulmonary embolism without acute cor pulmonale: Principal | ICD-10-CM | POA: Diagnosis present

## 2023-10-18 DIAGNOSIS — I5032 Chronic diastolic (congestive) heart failure: Secondary | ICD-10-CM | POA: Diagnosis present

## 2023-10-18 DIAGNOSIS — K439 Ventral hernia without obstruction or gangrene: Secondary | ICD-10-CM | POA: Diagnosis present

## 2023-10-18 DIAGNOSIS — Z886 Allergy status to analgesic agent status: Secondary | ICD-10-CM | POA: Diagnosis not present

## 2023-10-18 DIAGNOSIS — Z9049 Acquired absence of other specified parts of digestive tract: Secondary | ICD-10-CM

## 2023-10-18 DIAGNOSIS — E7800 Pure hypercholesterolemia, unspecified: Secondary | ICD-10-CM | POA: Diagnosis present

## 2023-10-18 DIAGNOSIS — Z86718 Personal history of other venous thrombosis and embolism: Secondary | ICD-10-CM

## 2023-10-18 DIAGNOSIS — I110 Hypertensive heart disease with heart failure: Secondary | ICD-10-CM | POA: Diagnosis present

## 2023-10-18 DIAGNOSIS — Z96649 Presence of unspecified artificial hip joint: Secondary | ICD-10-CM | POA: Diagnosis present

## 2023-10-18 DIAGNOSIS — K219 Gastro-esophageal reflux disease without esophagitis: Secondary | ICD-10-CM | POA: Diagnosis present

## 2023-10-18 DIAGNOSIS — I82411 Acute embolism and thrombosis of right femoral vein: Secondary | ICD-10-CM | POA: Diagnosis present

## 2023-10-18 DIAGNOSIS — Z7901 Long term (current) use of anticoagulants: Secondary | ICD-10-CM

## 2023-10-18 DIAGNOSIS — R079 Chest pain, unspecified: Secondary | ICD-10-CM | POA: Diagnosis not present

## 2023-10-18 DIAGNOSIS — Z79899 Other long term (current) drug therapy: Secondary | ICD-10-CM

## 2023-10-18 DIAGNOSIS — Z803 Family history of malignant neoplasm of breast: Secondary | ICD-10-CM

## 2023-10-18 DIAGNOSIS — Z801 Family history of malignant neoplasm of trachea, bronchus and lung: Secondary | ICD-10-CM

## 2023-10-18 DIAGNOSIS — Z8249 Family history of ischemic heart disease and other diseases of the circulatory system: Secondary | ICD-10-CM

## 2023-10-18 DIAGNOSIS — I82441 Acute embolism and thrombosis of right tibial vein: Secondary | ICD-10-CM | POA: Diagnosis present

## 2023-10-18 DIAGNOSIS — I2510 Atherosclerotic heart disease of native coronary artery without angina pectoris: Secondary | ICD-10-CM | POA: Diagnosis present

## 2023-10-18 DIAGNOSIS — Z823 Family history of stroke: Secondary | ICD-10-CM

## 2023-10-18 LAB — LIPASE, BLOOD: Lipase: 30 U/L (ref 11–51)

## 2023-10-18 LAB — CBC
HCT: 44.8 % (ref 36.0–46.0)
Hemoglobin: 15.5 g/dL — ABNORMAL HIGH (ref 12.0–15.0)
MCH: 31.9 pg (ref 26.0–34.0)
MCHC: 34.6 g/dL (ref 30.0–36.0)
MCV: 92.2 fL (ref 80.0–100.0)
Platelets: 182 10*3/uL (ref 150–400)
RBC: 4.86 MIL/uL (ref 3.87–5.11)
RDW: 13.7 % (ref 11.5–15.5)
WBC: 10.4 10*3/uL (ref 4.0–10.5)
nRBC: 0 % (ref 0.0–0.2)

## 2023-10-18 LAB — TROPONIN I (HIGH SENSITIVITY)
Troponin I (High Sensitivity): 15 ng/L (ref <18)
Troponin I (High Sensitivity): 15 ng/L (ref <18)

## 2023-10-18 LAB — HEPATIC FUNCTION PANEL
ALT: 27 U/L (ref 0–44)
AST: 23 U/L (ref 15–41)
Albumin: 3.6 g/dL (ref 3.5–5.0)
Alkaline Phosphatase: 77 U/L (ref 38–126)
Bilirubin, Direct: 0.2 mg/dL (ref 0.0–0.2)
Indirect Bilirubin: 0.9 mg/dL (ref 0.3–0.9)
Total Bilirubin: 1.1 mg/dL (ref 0.0–1.2)
Total Protein: 6.4 g/dL — ABNORMAL LOW (ref 6.5–8.1)

## 2023-10-18 LAB — BASIC METABOLIC PANEL WITH GFR
Anion gap: 8 (ref 5–15)
BUN: 16 mg/dL (ref 8–23)
CO2: 28 mmol/L (ref 22–32)
Calcium: 9.2 mg/dL (ref 8.9–10.3)
Chloride: 104 mmol/L (ref 98–111)
Creatinine, Ser: 1.09 mg/dL — ABNORMAL HIGH (ref 0.44–1.00)
GFR, Estimated: 54 mL/min — ABNORMAL LOW (ref >60)
Glucose, Bld: 155 mg/dL — ABNORMAL HIGH (ref 70–99)
Potassium: 3.6 mmol/L (ref 3.5–5.1)
Sodium: 140 mmol/L (ref 135–145)

## 2023-10-18 MED ORDER — DM-GUAIFENESIN ER 30-600 MG PO TB12: 1.0000 | ORAL_TABLET | Freq: Two times a day (BID) | ORAL | Status: DC | PRN

## 2023-10-18 MED ORDER — IOHEXOL 350 MG/ML SOLN
100.0000 mL | Freq: Once | INTRAVENOUS | Status: AC | PRN
  Administered 2023-10-18: 100 mL via INTRAVENOUS

## 2023-10-18 MED ORDER — HEPARIN (PORCINE) 25000 UT/250ML-% IV SOLN
1250.0000 [IU]/h | INTRAVENOUS | Status: DC
  Administered 2023-10-18: 1250 [IU]/h via INTRAVENOUS
  Filled 2023-10-18: qty 250

## 2023-10-18 MED ORDER — MORPHINE SULFATE (PF) 2 MG/ML IV SOLN: 2.0000 mg | INTRAVENOUS | Status: DC | PRN

## 2023-10-18 MED ORDER — ALUM & MAG HYDROXIDE-SIMETH 200-200-20 MG/5ML PO SUSP
30.0000 mL | Freq: Once | ORAL | Status: AC
  Administered 2023-10-18: 30 mL via ORAL
  Filled 2023-10-18: qty 30

## 2023-10-18 MED ORDER — ACETAMINOPHEN 325 MG PO TABS: 650.0000 mg | ORAL_TABLET | Freq: Four times a day (QID) | ORAL | Status: DC | PRN

## 2023-10-18 MED ORDER — ALBUTEROL SULFATE HFA 108 (90 BASE) MCG/ACT IN AERS: 2.0000 | INHALATION_SPRAY | RESPIRATORY_TRACT | Status: DC | PRN

## 2023-10-18 MED ORDER — HYDRALAZINE HCL 20 MG/ML IJ SOLN: 5.0000 mg | INTRAMUSCULAR | Status: DC | PRN

## 2023-10-18 MED ORDER — INSULIN ASPART 100 UNIT/ML IJ SOLN: 0.0000 [IU] | Freq: Every day | INTRAMUSCULAR | Status: DC

## 2023-10-18 MED ORDER — LIDOCAINE VISCOUS HCL 2 % MT SOLN
15.0000 mL | Freq: Once | OROMUCOSAL | Status: AC
  Administered 2023-10-18: 15 mL via ORAL
  Filled 2023-10-18: qty 15

## 2023-10-18 MED ORDER — ALBUTEROL SULFATE (2.5 MG/3ML) 0.083% IN NEBU: 2.5000 mg | INHALATION_SOLUTION | RESPIRATORY_TRACT | Status: DC | PRN

## 2023-10-18 MED ORDER — HEPARIN BOLUS VIA INFUSION
5000.0000 [IU] | Freq: Once | INTRAVENOUS | Status: AC
  Administered 2023-10-18: 5000 [IU] via INTRAVENOUS
  Filled 2023-10-18: qty 5000

## 2023-10-18 MED ORDER — INSULIN ASPART 100 UNIT/ML IJ SOLN
0.0000 [IU] | Freq: Three times a day (TID) | INTRAMUSCULAR | Status: DC
  Administered 2023-10-19: 2 [IU] via SUBCUTANEOUS
  Administered 2023-10-22: 1 [IU] via SUBCUTANEOUS
  Filled 2023-10-18 (×3): qty 1

## 2023-10-18 MED ORDER — ONDANSETRON HCL 4 MG/2ML IJ SOLN
4.0000 mg | Freq: Three times a day (TID) | INTRAMUSCULAR | Status: DC | PRN
  Administered 2023-10-19: 4 mg via INTRAVENOUS
  Filled 2023-10-18: qty 2

## 2023-10-18 MED ORDER — ONDANSETRON HCL 4 MG/2ML IJ SOLN
4.0000 mg | Freq: Once | INTRAMUSCULAR | Status: AC
  Administered 2023-10-18: 4 mg via INTRAVENOUS
  Filled 2023-10-18: qty 2

## 2023-10-18 MED ORDER — OXYCODONE-ACETAMINOPHEN 5-325 MG PO TABS
1.0000 | ORAL_TABLET | ORAL | Status: DC | PRN
  Administered 2023-10-19 – 2023-10-21 (×4): 1 via ORAL
  Filled 2023-10-18 (×4): qty 1

## 2023-10-18 NOTE — ED Triage Notes (Signed)
 Pt BIB [facility] for CP that started around 1800. Pt reports pain 4/10. Denies SOB and abd pain. Pt a/o x 4. Pt took an aspirin PTA  EMS: BG 137  129/83  HR 77  97%

## 2023-10-18 NOTE — Progress Notes (Signed)
 ANTICOAGULATION CONSULT NOTE  Pharmacy Consult for heparin infusion Indication: pulmonary embolus  Allergies  Allergen Reactions   Chlorpromazine Anaphylaxis and Other (See Comments)    Other Reaction: LOC Other Reaction: LOC Other Reaction: LOC Other Reaction: LOC    Metoclopramide Other (See Comments) and Anaphylaxis    Other Reaction: RESTLESS Twitching , cant sit still  Other Reaction: RESTLESS Other Reaction: RESTLESS Other Reaction: RESTLESS    Sulfamethizole Nausea And Vomiting and Other (See Comments)   Amlodipine     BLLE edema   Other Other (See Comments)    Torazine gave her syncope Upset stomach Upset stomach    Celecoxib Nausea Only and Anxiety   Meloxicam Other (See Comments) and Diarrhea    Patient Measurements: Height: 5\' 3"  (160 cm) Weight: 106.2 kg (234 lb 2.1 oz) IBW/kg (Calculated) : 52.4 Heparin Dosing Weight: 77.7 kg  Vital Signs: Temp: 98.1 F (36.7 C) (04/14 2001) Temp Source: Oral (04/14 2001) BP: 109/66 (04/14 2200) Pulse Rate: 76 (04/14 2230)  Labs: Recent Labs    10/18/23 2003 10/18/23 2219  HGB 15.5*  --   HCT 44.8  --   PLT 182  --   CREATININE 1.09*  --   TROPONINIHS 15 15    Estimated Creatinine Clearance: 54.4 mL/min (A) (by C-G formula based on SCr of 1.09 mg/dL (H)).   Medical History: Past Medical History:  Diagnosis Date   Actinic keratosis    Arthritis of knee    Benign essential HTN    Chest pain    a. she reports 4 normal heart caths; b. 12/2013 St Echo: nl Ivette Marks); c. 04/2016 MV: apical and ant defect @ rest consistent w/ breast attenuation. No ischemia. EF 55-65%.   Depression    Diverticulitis of intestine w/o perforation or abscess w/o bleeding    DVT (deep venous thrombosis) (HCC)    Dysphagia    Fibromyalgia    High cholesterol    History of colon resection    History of echocardiogram    a. 01/2017 Echo ([facility]): nl LV syst fxn.   Hyperlipidemia    Medial meniscus tear    Migraine     Neuromuscular disorder (HCC)    Obesity    Overactive bladder    Reflux    Subchondral insufficiency fracture of condyle of femur (HCC)    Tension headache     Assessment: Pt is a 73 yo female presenting to ED for CP, found with "extensive pulmonary emboli worse on the right than the left without evidence of right heart strain."  Goal of Therapy:  Heparin level 0.3-0.7 units/ml Monitor platelets by anticoagulation protocol: Yes   Plan:  Bolus 5000 units x 1 Start heparin infusion at 1250 units/hr Will check HL in 8 hr after start of infusion CBC daily while on heparin  Dhruvi Crenshaw S. Channon Ambrosini, PharmD, [facility] 10/18/2023 11:51 PM

## 2023-10-18 NOTE — H&P (Signed)
 History and Physical    Misty Hebert:[id number] DOB: 31-Aug-1950 DOA: 10/18/2023  Referring MD/NP/PA:   PCP: Leim Fabry, MD   Patient coming from:  The patient is coming from home.     Chief Complaint: Chest pain  HPI: Misty Hebert is a 73 y.o. female with medical history significant of DVT not on anticoagulants, HTN, HLD, DM, CAD, dCHF, morbid obesity, diverticulitis, s/p of colon resection, overactive bladder, migraine, who presents with chest pain.   Patient states that her chest pain started in the afternoon at 6:15 PM.  It is located in the front chest, constant, sharp, severe, radiating to the back.  Not aggravated or alleviated by any known factors.  Denies SOB, cough, fever or chills.  Patient has nausea, no vomiting, diarrhea or abdominal pain.  No symptoms of UTI.  No rectal bleeding or dark stool.  Patient states that she had 4 times of DVT in the past, currently not taking any anticoagulants.  Denies tenderness in the calf areas.  Data reviewed independently and ED Course: pt was found to have WBC 10.4, creatinine 1.09, BUN 16, GFR 54, troponin 15 --> 15.  Temperature normal, blood pressure 109/66, heart rate 80, oxygen saturation 96% on room air.  Chest x-ray negative.  Patient is admitted to PCU as inpatient.  Dr. Gilda Crease of [facility] is consulted.   CTA: Extensive pulmonary emboli worse on the right than the left without evidence of right heart strain.   Anterior abdominal wall hernia on the left containing a loop of transverse colon without obstructive change.   Chronic changes as described above.     EKG: I have personally reviewed.  Sinus rhythm, QTc 477, LAD, poor R wave progression.   Review of Systems:   General: no fevers, chills, no body weight gain, has poor appetite, has fatigue HEENT: no blurry vision, hearing changes or sore throat Respiratory: no dyspnea, coughing, wheezing CV: has chest pain, no palpitations GI: no nausea, vomiting,  abdominal pain, diarrhea, constipation GU: no dysuria, burning on urination, increased urinary frequency, hematuria  Ext: no leg edema Neuro: no unilateral weakness, numbness, or tingling, no vision change or hearing loss Skin: no rash, no skin tear. MSK: No muscle spasm, no deformity, no limitation of range of movement in spin Heme: No easy bruising.  Travel history: No recent long distant travel.   Allergy:  Allergies  Allergen Reactions   Chlorpromazine Anaphylaxis and Other (See Comments)    Other Reaction: LOC Other Reaction: LOC Other Reaction: LOC Other Reaction: LOC    Metoclopramide Other (See Comments) and Anaphylaxis    Other Reaction: RESTLESS Twitching , cant sit still  Other Reaction: RESTLESS Other Reaction: RESTLESS Other Reaction: RESTLESS    Sulfamethizole Nausea And Vomiting and Other (See Comments)   Amlodipine     BLLE edema   Other Other (See Comments)    Torazine gave her syncope Upset stomach Upset stomach    Celecoxib Nausea Only and Anxiety   Meloxicam Other (See Comments) and Diarrhea    Past Medical History:  Diagnosis Date   Actinic keratosis    Arthritis of knee    Benign essential HTN    Chest pain    a. she reports 4 normal heart caths; b. 12/2013 St Echo: nl Gavin Potters); c. 04/2016 MV: apical and ant defect @ rest consistent w/ breast attenuation. No ischemia. EF 55-65%.   Depression    Diverticulitis of intestine w/o perforation or abscess w/o bleeding  DVT (deep venous thrombosis) (HCC)    Dysphagia    Fibromyalgia    High cholesterol    History of colon resection    History of echocardiogram    a. 01/2017 Echo ([facility]): nl LV syst fxn.   Hyperlipidemia    Medial meniscus tear    Migraine    Neuromuscular disorder (HCC)    Obesity    Overactive bladder    Reflux    Subchondral insufficiency fracture of condyle of femur (HCC)    Tension headache     Past Surgical History:  Procedure Laterality Date   ABDOMINAL  HYSTERECTOMY     BREAST BIOPSY Right    CYST REMOVED-16-31YRS AGO-NEG   BREAST BIOPSY Left 03/24/2021   stereo bx of calcs UOQ anterior #1, coil marker, benign   BREAST BIOPSY Left 03/24/2021   stereo bx calcs UOQ posterior #2, x marker, benign   CARDIAC CATHETERIZATION  1995   [facility]   CARDIAC CATHE[facility]IZATION  2001   [facility]   CARDIAC CATHETERIZATION  2006   [facility]   CARDIAC CATHETERIZATION     [facility] with Dr. Bary Likes    COLON RESECTION     ESOPHAGOGASTRODUODENOSCOPY (EGD) WITH PROPOFOL N/A 02/06/2022   Procedure: ESOPHAGOGASTRODUODENOSCOPY (EGD) WITH PROPOFOL;  Surgeon: Shane Darling, MD;  Location: [facility] ENDOSCOPY;  Service: Endoscopy;  Laterality: N/A;   FOOT SURGERY Left 01/10/2016   03/06/16 second foot surgery   KNEE SURGERY     REVISION TOTAL HIP ARTHROPLASTY     June 2019   SHOULDER SURGERY      Social History:  reports that she has never smoked. She has never used smokeless tobacco. She reports that she does not drink alcohol and does not use drugs.  Family History:  Family History  Problem Relation Age of Onset   Breast cancer Maternal Aunt 40   Heart attack Mother    Heart disease Mother        pacemaker/ICD   Heart attack Father    Stroke Father    Lung cancer Father    Heart disease Brother        stents in early 50's     Prior to Admission medications   Medication Sig Start Date End Date Taking? Authorizing Provider  carvedilol (COREG) 6.25 MG tablet Take 6.25 mg by mouth 2 (two) times daily with a meal.    [provider]  dicyclomine (BENTYL) 10 MG capsule Take 10 mg by mouth 2 (two) times daily as needed. 08/19/20   [provider]  ezetimibe (ZETIA) 10 MG tablet Take 10 mg by mouth daily.    [provider]  fluticasone (FLONASE) 50 MCG/ACT nasal spray Place 2 sprays into both nostrils daily. Patient not taking: Reported on 04/19/2023 08/09/21   Sharla Davis, PA-C  hydrochlorothiazide  (HYDRODIURIL) 25 MG tablet Take 1 tablet (25 mg total) by mouth daily. 04/20/23   End, Veryl Gottron, MD  losartan (COZAAR) 100 MG tablet Take 100 mg by mouth daily.    [provider]  methylPREDNISolone (MEDROL DOSEPAK) 4 MG TBPK tablet 6 day dose pack - take as directed Patient not taking: Reported on 04/19/2023 07/28/22   Hyatt, Max T, DPM  mupirocin ointment (BACTROBAN) 2 % Apply 1 Application topically daily. Qd to excision site Patient not taking: Reported on 04/19/2023 02/23/23   Elta Halter, MD  nitroGLYCERIN (NITROSTAT) 0.4 MG SL tablet Place 1 tablet (0.4 mg total) under the tongue every 5 (five)  minutes as needed for chest pain (maximum of 3 doses.). 04/19/23   Carlos Levering, NP  pravastatin (PRAVACHOL) 80 MG tablet Take 1 tablet (80 mg total) by mouth daily. 04/20/23   End, Cristal Deer, MD  Semaglutide,0.25 or 0.5MG /DOS, (OZEMPIC, 0.25 OR 0.5 MG/DOSE,) 2 MG/1.5ML SOPN Inject 0.5 mg into the skin once a week.    [provider]    Physical Exam: Vitals:   10/18/23 2330 10/18/23 2333 10/19/23 0045 10/19/23 0100  BP: (!) 143/80  (!) 114/50 112/65  Pulse: 83  72 71  Temp:      TempSrc:      SpO2: 94%  96% 92%  Weight:  106.2 kg    Height:  5\' 3"  (1.6 m)     General: Not in acute distress HEENT:       Eyes: PERRL, EOMI, no jaundice       ENT: No discharge from the ears and nose, no pharynx injection, no tonsillar enlargement.        Neck: No JVD, no bruit, no mass felt. Heme: No neck lymph node enlargement. Cardiac: S1/S2, RRR, No murmurs, No gallops or rubs. Respiratory: No rales, wheezing, rhonchi or rubs. GI: Soft, nondistended, nontender, no rebound pain, no organomegaly, BS present. GU: No hematuria Ext: No pitting leg edema bilaterally. 1+DP/PT pulse bilaterally. Musculoskeletal: No joint deformities, No joint redness or warmth, no limitation of ROM in spin. Skin: No rashes.  Neuro: Alert, oriented X3, cranial nerves II-XII grossly intact,  moves all extremities normally. Psych: Patient is not psychotic, no suicidal or hemocidal ideation.  Labs on Admission: I have personally reviewed following labs and imaging studies  CBC: Recent Labs  Lab 10/18/23 2003  WBC 10.4  HGB 15.5*  HCT 44.8  MCV 92.2  PLT 182   Basic Metabolic Panel: Recent Labs  Lab 10/18/23 2003  NA 140  K 3.6  CL 104  CO2 28  GLUCOSE 155*  BUN 16  CREATININE 1.09*  CALCIUM 9.2   GFR: Estimated Creatinine Clearance: 54.4 mL/min (A) (by C-G formula based on SCr of 1.09 mg/dL (H)). Liver Function Tests: Recent Labs  Lab 10/18/23 2219  AST 23  ALT 27  ALKPHOS 77  BILITOT 1.1  PROT 6.4*  ALBUMIN 3.6   Recent Labs  Lab 10/18/23 2219  LIPASE 30   No results for input(s): "AMMONIA" in the last 168 hours. Coagulation Profile: Recent Labs  Lab 10/18/23 2215  INR 1.1   Cardiac Enzymes: No results for input(s): "CKTOTAL", "CKMB", "CKMBINDEX", "TROPONINI" in the last 168 hours. BNP (last 3 results) No results for input(s): "PROBNP" in the last 8760 hours. HbA1C: No results for input(s): "HGBA1C" in the last 72 hours. CBG: No results for input(s): "GLUCAP" in the last 168 hours. Lipid Profile: No results for input(s): "CHOL", "HDL", "LDLCALC", "TRIG", "CHOLHDL", "LDLDIRECT" in the last 72 hours. Thyroid Function Tests: No results for input(s): "TSH", "T4TOTAL", "FREET4", "T3FREE", "THYROIDAB" in the last 72 hours. Anemia Panel: No results for input(s): "VITAMINB12", "FOLATE", "FERRITIN", "TIBC", "IRON", "RETICCTPCT" in the last 72 hours. Urine analysis:    Component Value Date/Time   COLORURINE Straw 10/27/2014 0405   APPEARANCEUR Clear 10/27/2014 0405   LABSPEC 1.046 10/27/2014 0405   PHURINE 5.0 10/27/2014 0405   GLUCOSEU Negative 10/27/2014 0405   HGBUR Negative 10/27/2014 0405   BILIRUBINUR Negative 10/27/2014 0405   KETONESUR Negative 10/27/2014 0405   PROTEINUR Negative 10/27/2014 0405   NITRITE Negative 10/27/2014  0405   LEUKOCYTESUR Negative 10/27/2014 0405  Sepsis Labs: @LABRCNTIP (procalcitonin:4,lacticidven:4) )No results found for this or any previous visit (from the past 240 hours).   Radiological Exams on Admission:   Assessment/Plan Principal Problem:   Acute pulmonary embolism (HCC) Active Problems:   Essential hypertension   CAD (coronary artery disease)   HLD (hyperlipidemia)   Chronic heart failure with preserved ejection fraction (HFpEF) (HCC)   Morbid obesity with BMI of 40.0-44.9, adult (HCC)   Assessment and Plan:   Acute pulmonary embolism (HCC): CTA showed  extensive pulmonary emboli worse on the right than the left without CT evidence of right heart strain.  Currently hemodynamically stable.  No oxygen desaturation.  Consulted Dr. Gilda Crease for [facility]. -admit to PCU as pt -heparin drip initiated -2D echocardiogram ordered -LE dopplers ordered to evaluate for DVT -pain control: When necessary Percocet and Dilaudid -prn albuterol nebs and mucinex   Essential hypertension: Blood pressure 109/66 -Continue home Coreg - Hold HCTZ, Cozaar, oral hydralazine since patient is at risk of developing hypotension due to extensive PE. - IV hydralazine as needed  CAD (coronary artery disease): Troponin negative x 2. -As needed nitroglycerin - Continue pravastatin, Zetia  HLD (hyperlipidemia) -Pravastatin, Zetia - Patient is also on Repatha  Chronic heart failure with preserved ejection fraction (HFpEF) (HCC): 2D echo on 01/05/2017 showed EF> 55% with grade 1 diastolic dysfunction.  Patient does not have leg edema.  CHF is compensated. - Check BNP  Morbid obesity with BMI of 40.0-44.9, adult [facility]): Body weight 106.2 kg, BMI 41.47 - Encourage losing weight - Exercise healthy diet    DVT ppx: on IV Heparin      Code Status: Full code    Family Communication:     not done, no family member is at bed side.     Disposition Plan:  Anticipate discharge back to previous  environment  Consults called:  Dr. Gilda Crease of [facility] is consulted.  Admission status and Level of care: Progressive as inpt        Dispo: The patient is from: Home              Anticipated d/c is to: Home              Anticipated d/c date is: 2 days              Patient currently is not medically stable to d/c.    Severity of Illness:  The appropriate patient status for this patient is INPATIENT. Inpatient status is judged to be reasonable and necessary in order to provide the required intensity of service to ensure the patient's safety. The patient's presenting symptoms, physical exam findings, and initial radiographic and laboratory data in the context of their chronic comorbidities is felt to place them at high risk for further clinical deterioration. Furthermore, it is not anticipated that the patient will be medically stable for discharge from the hospital within 2 midnights of admission.   * I certify that at the point of admission it is my clinical judgment that the patient will require inpatient hospital care spanning beyond 2 midnights from the point of admission due to high intensity of service, high risk for further deterioration and high frequency of surveillance required.*       Date of Service 10/19/2023    Lorretta Harp [facility]   If 7PM-7AM, please contact night-coverage [email or web address] 10/19/2023, 1:55 AM

## 2023-10-18 NOTE — ED Provider Notes (Signed)
 [facility] Provider Note    Event Date/Time   First MD Initiated Contact with Patient 10/18/23 2150     (approximate)   History   Chest Pain  Pt BIB [facility] for CP that started around 1800. Pt reports pain 4/10. Denies SOB and abd pain. Pt a/o x 4. Pt took an aspirin PTA  EMS: BG 137  129/83  HR 77  97%     HPI Misty Hebert is a 73 y.o. female PMH CHF, hyperlipidemia, DM2, CAD, prior DVT presents for evaluation of chest pain - Patient states around 6:15 PM while she was at home she developed a sharp stabbing chest pain radiating from her mid chest to her back.  Felt very diaphoretic and as if she was going to pass out.  Had to sit down.  Family called an ambulance.  Continue to have chest discomfort with ambulance, brought to emergency department for eval.  Has had ongoing chest discomfort though significantly improved from prior, now only 4/10.  Does have some ongoing nausea. -No black or bloody stools, no vomiting.  Had a bowel movement today is unremarkable.  No abdominal pain. 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  Also notes she had a 23 year old daughter that died from a pulmonary embolism.     Physical Exam   Triage Vital Signs: ED Triage Vitals  Encounter Vitals Group     BP 10/18/23 2001 (!) 148/118     Systolic BP Percentile --      Diastolic BP Percentile --      Pulse Rate 10/18/23 2001 80     Resp --      Temp 10/18/23 2001 98.1 F (36.7 C)     Temp Source 10/18/23 2001 Oral     SpO2 10/18/23 2001 98 %     Weight 10/18/23 1959 230 lb (104.3 kg)     Height 10/18/23 1959 5\' 3"  (1.6 m)     Head Circumference --      Peak Flow --      Pain Score 10/18/23 1959 4     Pain Loc --      Pain Education --      Exclude from Growth Chart --     Most recent vital signs: Vitals:   10/18/23 2215 10/18/23 2230  BP:    Pulse: 71 76  Temp:    SpO2: 98% 96%      General: Awake, no distress.  CV:  Good peripheral perfusion. RRR, RP 2+.  Strong pulses in all extremities, left upper extremity somewhat weak pulse than right though patient states this is chronic and well-known to her. Resp:  Normal effort. CTAB Abd:  No distention. Nontender to deep palpation throughout Other:     ED Results / Procedures / Treatments   Labs (all labs ordered are listed, but only abnormal results are displayed) Labs Reviewed  BASIC METABOLIC PANEL WITH GFR - Abnormal; Notable for the following components:      Result Value   Glucose, Bld 155 (*)    Creatinine, Ser 1.09 (*)    GFR, Estimated 54 (*)    All other components within normal limits  CBC - Abnormal; Notable for the following components:   Hemoglobin 15.5 (*)    All other components within normal limits  HEPATIC FUNCTION PANEL - Abnormal; Notable for the following components:   Total Protein 6.4 (*)  All other components within normal limits  LIPASE, BLOOD  APTT  PROTIME-INR  BRAIN NATRIURETIC PEPTIDE  CBC  BASIC METABOLIC PANEL WITH GFR  TROPONIN I (HIGH SENSITIVITY)  TROPONIN I (HIGH SENSITIVITY)     EKG  Ecg = sinus rhythm, rate 76, no ST elevation or depression, no significant repolarization abnormality, left axis deviation present.  No clear evidence of ischemia.  Similar to prior from 04/2023.   RADIOLOGY Chest x-ray with no wide mediastinum, no pneumothorax, no pneumonia on my interpretation.  Formal radiology read reviewed.  CT chest interpreted by myself and radiology report reviewed.  Large bilateral pulmonary emboli.  PROCEDURES:  Critical Care performed: Yes  .Critical Care  Performed by: Marinell Blight, MD Authorized by: Marinell Blight, MD   Critical care provider statement:    Critical care time (minutes):  30   Critical care time was exclusive of:  Separately billable procedures and treating other patients   Critical care was necessary to treat or prevent  imminent or life-threatening deterioration of the following conditions:  Circulatory failure (large bilateral PE requiring heparin)   Critical care was time spent personally by me on the following activities:  Development of treatment plan with patient or surrogate, discussions with consultants, evaluation of patient's response to treatment, examination of patient, ordering and review of laboratory studies, ordering and review of radiographic studies, ordering and performing treatments and interventions, pulse oximetry, re-evaluation of patient's condition and review of old charts   I assumed direction of critical care for this patient from another provider in my specialty: no     Care discussed with: admitting provider      MEDICATIONS ORDERED IN ED: Medications  heparin ADULT infusion 100 units/mL (25000 units/284mL) (1,250 Units/hr Intravenous New Bag/Given 10/18/23 2358)  dextromethorphan-guaiFENesin (MUCINEX DM) 30-600 MG per 12 hr tablet 1 tablet (has no administration in time range)  ondansetron (ZOFRAN) injection 4 mg (has no administration in time range)  hydrALAZINE (APRESOLINE) injection 5 mg (has no administration in time range)  acetaminophen (TYLENOL) tablet 650 mg (has no administration in time range)  albuterol (PROVENTIL) (2.5 MG/3ML) 0.083% nebulizer solution 2.5 mg (has no administration in time range)  insulin aspart (novoLOG) injection 0-9 Units (has no administration in time range)  insulin aspart (novoLOG) injection 0-5 Units (has no administration in time range)  morphine (PF) 2 MG/ML injection 2 mg (has no administration in time range)  oxyCODONE-acetaminophen (PERCOCET/ROXICET) 5-325 MG per tablet 1 tablet (has no administration in time range)  ondansetron (ZOFRAN) injection 4 mg (4 mg Intravenous Given 10/18/23 2334)  alum & mag hydroxide-simeth (MAALOX/MYLANTA) 200-200-20 MG/5ML suspension 30 mL (30 mLs Oral Given 10/18/23 2334)    And  lidocaine (XYLOCAINE) 2 % viscous  mouth solution 15 mL (15 mLs Oral Given 10/18/23 2334)  iohexol (OMNIPAQUE) 350 MG/ML injection 100 mL (100 mLs Intravenous Contrast Given 10/18/23 2306)  heparin bolus via infusion 5,000 Units (5,000 Units Intravenous Bolus from Bag 10/18/23 2357)     IMPRESSION / MDM / ASSESSMENT AND PLAN / ED COURSE  I reviewed the triage vital signs and the nursing notes.                              DDX/MDM/AP: Differential diagnosis includes, but is not limited to, recurrent pulmonary embolism, ACS, aortic dissection, consider dyspepsia/gastritis.  Plan: - Labs - EKG - Chest x-ray -  CT PE - Zofran, Maalox - Reassess  Patient's presentation is most consistent with acute presentation with potential threat to life or bodily function.  The patient is on the cardiac monitor to evaluate for evidence of arrhythmia and/or significant heart rate changes.  ED course below.  Workup notable for large bilateral pulmonary emboli, right greater than left, no evidence of right heart strain.  Hemodynamically stable, no oxygen requirement.  Started on heparin, admitted to hospitalist service.  Clinical Course as of 10/19/23 0000  Mon Oct 18, 2023  2255 Rpt trop stable [MM]  2331 Call from radiology, large bilateral PE, right greater than left.  No evidence of right heart strain.  Patient remains hemodynamically stable.  Heparin ordered.  Hospitalist consult order placed. [MM]    Clinical Course User Index [MM] Collis Deaner, MD     FINAL CLINICAL IMPRESSION(S) / ED DIAGNOSES   Final diagnoses:  Bilateral pulmonary embolism (HCC)     Rx / DC Orders   ED Discharge Orders     None        Note:  This document was prepared using Dragon voice recognition software and may include unintentional dictation errors.   Collis Deaner, MD 10/19/23 0000

## 2023-10-19 ENCOUNTER — Inpatient Hospital Stay (HOSPITAL_COMMUNITY)
Admit: 2023-10-19 | Discharge: 2023-10-19 | Disposition: A | Discharge status: Home / Self Care | Attending: Internal Medicine | Admitting: Internal Medicine

## 2023-10-19 ENCOUNTER — Inpatient Hospital Stay

## 2023-10-19 ENCOUNTER — Encounter: Payer: Self-pay | Admitting: Internal Medicine

## 2023-10-19 DIAGNOSIS — I2699 Other pulmonary embolism without acute cor pulmonale: Secondary | ICD-10-CM

## 2023-10-19 DIAGNOSIS — R079 Chest pain, unspecified: Secondary | ICD-10-CM

## 2023-10-19 LAB — ECHOCARDIOGRAM COMPLETE
AR max vel: 1.8 cm2
AV Area VTI: 1.96 cm2
AV Area mean vel: 1.47 cm2
AV Mean grad: 5 mmHg
AV Peak grad: 11.2 mmHg
Ao pk vel: 1.67 m/s
Area-P 1/2: 2.1 cm2
Height: 63 in
MV VTI: 1.99 cm2
S' Lateral: 2.8 cm
Weight: 3746.06 [oz_av]

## 2023-10-19 LAB — CBC
HCT: 40.7 % (ref 36.0–46.0)
Hemoglobin: 14.2 g/dL (ref 12.0–15.0)
MCH: 32.3 pg (ref 26.0–34.0)
MCHC: 34.9 g/dL (ref 30.0–36.0)
MCV: 92.5 fL (ref 80.0–100.0)
Platelets: 164 10*3/uL (ref 150–400)
RBC: 4.4 MIL/uL (ref 3.87–5.11)
RDW: 13.9 % (ref 11.5–15.5)
WBC: 8 10*3/uL (ref 4.0–10.5)
nRBC: 0 % (ref 0.0–0.2)

## 2023-10-19 LAB — BASIC METABOLIC PANEL WITH GFR
Anion gap: 8 (ref 5–15)
BUN: 19 mg/dL (ref 8–23)
CO2: 26 mmol/L (ref 22–32)
Calcium: 8.6 mg/dL — ABNORMAL LOW (ref 8.9–10.3)
Chloride: 102 mmol/L (ref 98–111)
Creatinine, Ser: 1.21 mg/dL — ABNORMAL HIGH (ref 0.44–1.00)
GFR, Estimated: 48 mL/min — ABNORMAL LOW (ref >60)
Glucose, Bld: 155 mg/dL — ABNORMAL HIGH (ref 70–99)
Potassium: 3.7 mmol/L (ref 3.5–5.1)
Sodium: 136 mmol/L (ref 135–145)

## 2023-10-19 LAB — CBG MONITORING, ED
Glucose-Capillary: 146 mg/dL — ABNORMAL HIGH (ref 70–99)
Glucose-Capillary: 159 mg/dL — ABNORMAL HIGH (ref 70–99)
Glucose-Capillary: 121 mg/dL — ABNORMAL HIGH (ref 70–99)

## 2023-10-19 LAB — PROTIME-INR
INR: 1.1 (ref 0.8–1.2)
Prothrombin Time: 14.3 s (ref 11.4–15.2)

## 2023-10-19 LAB — BRAIN NATRIURETIC PEPTIDE: B Natriuretic Peptide: 13 pg/mL (ref 0.0–100.0)

## 2023-10-19 LAB — GLUCOSE, CAPILLARY: Glucose-Capillary: 132 mg/dL — ABNORMAL HIGH (ref 70–99)

## 2023-10-19 LAB — APTT: aPTT: 22 s — ABNORMAL LOW (ref 24–36)

## 2023-10-19 LAB — HEPARIN LEVEL (UNFRACTIONATED)
Heparin Unfractionated: >1.1 [IU]/mL — ABNORMAL HIGH (ref 0.30–0.70)
Heparin Unfractionated: 0.66 [IU]/mL (ref 0.30–0.70)

## 2023-10-19 MED ORDER — HEPARIN (PORCINE) 25000 UT/250ML-% IV SOLN
1000.0000 [IU]/h | INTRAVENOUS | Status: AC
  Administered 2023-10-19 – 2023-10-20 (×3): 1100 [IU]/h via INTRAVENOUS
  Administered 2023-10-21: 1000 [IU]/h via INTRAVENOUS
  Filled 2023-10-19 (×3): qty 250

## 2023-10-19 MED ORDER — METHYLPREDNISOLONE SODIUM SUCC 125 MG IJ SOLR
125.0000 mg | Freq: Once | INTRAMUSCULAR | Status: DC | PRN
Start: 2023-10-19 — End: 2023-10-20

## 2023-10-19 MED ORDER — FENTANYL CITRATE PF 50 MCG/ML IJ SOSY: 12.5000 ug | PREFILLED_SYRINGE | Freq: Once | INTRAMUSCULAR | Status: DC | PRN

## 2023-10-19 MED ORDER — PANTOPRAZOLE SODIUM 40 MG PO TBEC
40.0000 mg | DELAYED_RELEASE_TABLET | Freq: Every day | ORAL | Status: DC
  Administered 2023-10-19 – 2023-10-22 (×4): 40 mg via ORAL
  Filled 2023-10-19 (×4): qty 1

## 2023-10-19 MED ORDER — NITROGLYCERIN 0.4 MG SL SUBL: 0.4000 mg | SUBLINGUAL_TABLET | SUBLINGUAL | Status: DC | PRN

## 2023-10-19 MED ORDER — PRAVASTATIN SODIUM 40 MG PO TABS
80.0000 mg | ORAL_TABLET | Freq: Every day | ORAL | Status: DC
  Administered 2023-10-19 – 2023-10-22 (×4): 80 mg via ORAL
  Filled 2023-10-19 (×2): qty 2
  Filled 2023-10-19: qty 4
  Filled 2023-10-19: qty 2

## 2023-10-19 MED ORDER — DIPHENHYDRAMINE HCL 50 MG/ML IJ SOLN: 50.0000 mg | Freq: Once | INTRAMUSCULAR | Status: DC | PRN

## 2023-10-19 MED ORDER — EZETIMIBE 10 MG PO TABS
10.0000 mg | ORAL_TABLET | Freq: Every day | ORAL | Status: DC
  Administered 2023-10-19 – 2023-10-22 (×4): 10 mg via ORAL
  Filled 2023-10-19 (×4): qty 1

## 2023-10-19 MED ORDER — FAMOTIDINE 20 MG PO TABS: 40.0000 mg | ORAL_TABLET | Freq: Once | ORAL | Status: DC | PRN

## 2023-10-19 MED ORDER — HYDROMORPHONE HCL 1 MG/ML IJ SOLN
1.0000 mg | INTRAMUSCULAR | Status: DC | PRN
  Administered 2023-10-19 – 2023-10-21 (×4): 1 mg via INTRAVENOUS
  Filled 2023-10-19 (×3): qty 1

## 2023-10-19 MED ORDER — MELATONIN 5 MG PO TABS
5.0000 mg | ORAL_TABLET | Freq: Every evening | ORAL | Status: DC | PRN
  Administered 2023-10-19 – 2023-10-21 (×3): 5 mg via ORAL
  Filled 2023-10-19 (×3): qty 1

## 2023-10-19 MED ORDER — CEFAZOLIN SODIUM-DEXTROSE 2-4 GM/100ML-% IV SOLN
2.0000 g | INTRAVENOUS | Status: AC
  Administered 2023-10-20: 2 g via INTRAVENOUS

## 2023-10-19 MED ORDER — MIDAZOLAM HCL 2 MG/ML PO SYRP: 8.0000 mg | ORAL_SOLUTION | Freq: Once | ORAL | Status: DC | PRN

## 2023-10-19 MED ORDER — CARVEDILOL 6.25 MG PO TABS
6.2500 mg | ORAL_TABLET | Freq: Two times a day (BID) | ORAL | Status: DC
  Administered 2023-10-19 – 2023-10-22 (×7): 6.25 mg via ORAL
  Filled 2023-10-19 (×7): qty 1

## 2023-10-19 MED ORDER — SODIUM CHLORIDE 0.9 % IV SOLN: INTRAVENOUS | Status: DC

## 2023-10-19 NOTE — Progress Notes (Signed)
 ANTICOAGULATION CONSULT NOTE  Pharmacy Consult for heparin infusion Indication: pulmonary embolus  Allergies  Allergen Reactions   Chlorpromazine Anaphylaxis and Other (See Comments)    Other Reaction: LOC Other Reaction: LOC Other Reaction: LOC Other Reaction: LOC    Metoclopramide Other (See Comments) and Anaphylaxis    Other Reaction: RESTLESS Twitching , cant sit still  Other Reaction: RESTLESS Other Reaction: RESTLESS Other Reaction: RESTLESS    Sulfamethizole Nausea And Vomiting and Other (See Comments)   Amlodipine     BLLE edema   Other Other (See Comments)    Torazine gave her syncope Upset stomach Upset stomach    Celecoxib Nausea Only and Anxiety   Meloxicam Other (See Comments) and Diarrhea    Patient Measurements: Height: 5\' 3"  (160 cm) Weight: 106.2 kg (234 lb 2.1 oz) IBW/kg (Calculated) : 52.4 Heparin Dosing Weight: 77.7 kg  Vital Signs: Temp: 98.1 F (36.7 C) (04/15 0558) Temp Source: Oral (04/15 0558) BP: 138/73 (04/15 0700) Pulse Rate: 66 (04/15 0700)  Labs: Recent Labs    10/18/23 2003 10/18/23 2215 10/18/23 2219 10/19/23 0758  HGB 15.5*  --   --  14.2  HCT 44.8  --   --  40.7  PLT 182  --   --  164  APTT  --  22*  --   --   LABPROT  --  14.3  --   --   INR  --  1.1  --   --   HEPARINUNFRC  --   --   --  >1.10*  CREATININE 1.09*  --   --  1.21*  TROPONINIHS 15  --  15  --     Estimated Creatinine Clearance: 49 mL/min (A) (by C-G formula based on SCr of 1.21 mg/dL (H)).   Medical History: Past Medical History:  Diagnosis Date   Actinic keratosis    Arthritis of knee    Benign essential HTN    Chest pain    a. she reports 4 normal heart caths; b. 12/2013 St Echo: nl Gavin Potters); c. 04/2016 MV: apical and ant defect @ rest consistent w/ breast attenuation. No ischemia. EF 55-65%.   Depression    Diverticulitis of intestine w/o perforation or abscess w/o bleeding    DVT (deep venous thrombosis) (HCC)    Dysphagia     Fibromyalgia    High cholesterol    History of colon resection    History of echocardiogram    a. 01/2017 Echo ([facility]): nl LV syst fxn.   Hyperlipidemia    Medial meniscus tear    Migraine    Neuromuscular disorder (HCC)    Obesity    Overactive bladder    Reflux    Subchondral insufficiency fracture of condyle of femur (HCC)    Tension headache     Assessment: Pt is a 73 yo female presenting to ED for CP, found with "extensive pulmonary emboli worse on the right than the left without evidence of right heart strain."  0415 0758 HL >1.10   SUPRAtherapeutic     Goal of Therapy:  Heparin level 0.3-0.7 units/ml Monitor platelets by anticoagulation protocol: Yes   Plan:  0415 0758  HL >1.10   SUPRAtherapeutic Hold heparin drip x 1 hour decrease heparin infusion to 1100 units/hr Will check HL in 8 hr after rate change CBC daily while on heparin  Bari Mantis PharmD Clinical Pharmacist 10/19/2023

## 2023-10-19 NOTE — ED Notes (Signed)
 Patient ordering food with dietary at this time.

## 2023-10-19 NOTE — Progress Notes (Signed)
*  PRELIMINARY RESULTS* Echocardiogram 2D Echocardiogram has been performed.  Silvana Drones 10/19/2023, 12:41 PM

## 2023-10-19 NOTE — Progress Notes (Signed)
 Progress Note   Patient: Misty Hebert XWR:[id number] DOB: 1950-09-19 DOA: 10/18/2023     1 DOS: the patient was seen and examined on 10/19/2023   Brief hospital course:  Misty Hebert is a 73 y.o. female with medical history significant of DVT not on anticoagulants, HTN, HLD, DM, CAD, dCHF, morbid obesity, diverticulitis, s/p of colon resection, overactive bladder, migraine, who presents with chest pain.    Patient states that her chest pain started in the afternoon at 6:15 PM.  It is located in the front chest, constant, sharp, severe, radiating to the back.  Not aggravated or alleviated by any known factors.  Denies SOB, cough, fever or chills.  Patient has nausea, no vomiting, diarrhea or abdominal pain.  No symptoms of UTI.  No rectal bleeding or dark stool.  Patient states that she had 4 times of DVT in the past, currently not taking any anticoagulants.  Denies tenderness in the calf areas.   Data reviewed independently and ED Course: pt was found to have WBC 10.4, creatinine 1.09, BUN 16, GFR 54, troponin 15 --> 15.  Temperature normal, blood pressure 109/66, heart rate 80, oxygen saturation 96% on room air.  Chest x-ray negative.  Patient is admitted to PCU as inpatient.  Dr. Gilda Crease of [facility] is consulted.    CTA: Extensive pulmonary emboli worse on the right than the left without evidence of right heart strain.   Anterior abdominal wall hernia on the left containing a loop of transverse colon without obstructive change.   Chronic changes as described above.         Assessment and Plan:  Principal Problem:   Acute pulmonary embolism (HCC) Active Problems:   Essential hypertension   CAD (coronary artery disease)   HLD (hyperlipidemia)   Chronic heart failure with preserved ejection fraction (HFpEF) (HCC)   Morbid obesity with BMI of 40.0-44.9, adult (HCC)     Assessment and Plan:     Acute pulmonary embolism [facility]):  Right lower extremity DVT Presented for  evaluation of chest pain and near syncope CTA showed  extensive pulmonary emboli worse on the right than the left without CT evidence of right heart strain.  Currently hemodynamically stable.  No hypoxia Appreciate vascular surgery input, plan is for thrombectomy in a.m. Continue heparin drip initiated 2D echocardiogram pending Lower extremity Doppler shows nonocclusive DVT in the right common femoral, popliteal, and calf veins. No evidence for DVT in the left lower extremity.     Essential hypertension: Continue carvedilol Hold HCTZ, Cozaar, oral hydralazine since patient is at risk of developing hypotension due to extensive PE.    CAD (coronary artery disease):  Continue carvedilol, Zetia, sublingual nitroglycerin and pravastatin      Chronic heart failure with preserved ejection fraction (HFpEF) (HCC):  2D echo on 01/05/2017 showed EF> 55% with grade 1 diastolic dysfunction.   CHF is compensated.   Morbid obesity with BMI of 40.0-44.9, adult [facility]): (Class 3)  Body weight 106.2 kg, BMI 41.47 Complicates overall prognosis and care. Lifestyle modification and exercise has been discussed with patient in detail.           Subjective: Patient is seen and examined at the bedside.  Very anxious and requesting transfer to either [facility] or [facility]  Physical Exam: Vitals:   10/19/23 0900 10/19/23 1042 10/19/23 1130 10/19/23 1200  BP: 128/74  (!) 126/59 (!) 114/54  Pulse: 68  (!) 58 67  Resp: 16  18 20   Temp:  97.9 F (  36.6 C)    TempSrc:  Oral    SpO2: 98%  98% 97%  Weight:      Height:       General: Not in acute distress HEENT:       Eyes: PERRL, EOMI, no jaundice       ENT: No discharge from the ears and nose, no pharynx injection, no tonsillar enlargement.        Neck: No JVD, no bruit, no mass felt. Heme: No neck lymph node enlargement. Cardiac: S1/S2, RRR, No murmurs, No gallops or rubs. Respiratory: No rales, wheezing, rhonchi or rubs. GI: Soft, nondistended,  nontender, no rebound pain, no organomegaly, BS present. GU: No hematuria Ext: No pitting leg edema bilaterally. 1+DP/PT pulse bilaterally. Musculoskeletal: No joint deformities, No joint redness or warmth, no limitation of ROM in spin. Skin: No rashes.  Neuro: Alert, oriented X3, cranial nerves II-XII grossly intact, moves all extremities normally. Psych: Patient is not psychotic, no suicidal or hemocidal ideation.   Data Reviewed: Creatinine 1.21 There are no new results to review at this time.  Family Communication: Plan of care discussed with patient and her husband at the bedside.  All questions and concerns have been addressed.  She verbalizes understanding and agrees with the plan. Informed by patient's RN that she no longer wishes to be transferred and is okay with being admitted here at [facility]  Disposition: Status is: Inpatient Remains inpatient appropriate because: For thrombectomy  Planned Discharge Destination: Home    Time spent: 38 minutes  Author: Read Camel, MD 10/19/2023 2:17 PM  For on call review [email or web address].

## 2023-10-19 NOTE — ED Notes (Signed)
 Staff at bedside completing ECHO at this time

## 2023-10-19 NOTE — Progress Notes (Signed)
 ANTICOAGULATION CONSULT NOTE  Pharmacy Consult for heparin infusion Indication: pulmonary embolus  Allergies  Allergen Reactions   Chlorpromazine Anaphylaxis and Other (See Comments)    Other Reaction: LOC Other Reaction: LOC Other Reaction: LOC Other Reaction: LOC    Metoclopramide Other (See Comments) and Anaphylaxis    Other Reaction: RESTLESS Twitching , cant sit still  Other Reaction: RESTLESS Other Reaction: RESTLESS Other Reaction: RESTLESS    Sulfamethizole Nausea And Vomiting and Other (See Comments)   Amlodipine     BLLE edema   Other Other (See Comments)    Torazine gave her syncope Upset stomach Upset stomach    Celecoxib Nausea Only and Anxiety   Meloxicam Other (See Comments) and Diarrhea    Patient Measurements: Height: 5\' 3"  (160 cm) Weight: 106.2 kg (234 lb 2.1 oz) IBW/kg (Calculated) : 52.4 Heparin Dosing Weight: 77.7 kg  Vital Signs: Temp: 98.1 F (36.7 C) (04/15 1800) Temp Source: Oral (04/15 1658) BP: 142/60 (04/15 1800) Pulse Rate: 80 (04/15 1800)  Labs: Recent Labs    10/18/23 2003 10/18/23 2215 10/18/23 2219 10/19/23 0758 10/19/23 1710  HGB 15.5*  --   --  14.2  --   HCT 44.8  --   --  40.7  --   PLT 182  --   --  164  --   APTT  --  22*  --   --   --   LABPROT  --  14.3  --   --   --   INR  --  1.1  --   --   --   HEPARINUNFRC  --   --   --  >1.10* 0.66  CREATININE 1.09*  --   --  1.21*  --   TROPONINIHS 15  --  15  --   --     Estimated Creatinine Clearance: 49 mL/min (A) (by C-G formula based on SCr of 1.21 mg/dL (H)).   Medical History: Past Medical History:  Diagnosis Date   Actinic keratosis    Arthritis of knee    Benign essential HTN    Chest pain    a. she reports 4 normal heart caths; b. 12/2013 St Echo: nl Ivette Marks); c. 04/2016 MV: apical and ant defect @ rest consistent w/ breast attenuation. No ischemia. EF 55-65%.   Depression    Diverticulitis of intestine w/o perforation or abscess w/o bleeding    DVT  (deep venous thrombosis) (HCC)    Dysphagia    Fibromyalgia    High cholesterol    History of colon resection    History of echocardiogram    a. 01/2017 Echo ([facility]): nl LV syst fxn.   Hyperlipidemia    Medial meniscus tear    Migraine    Neuromuscular disorder (HCC)    Obesity    Overactive bladder    Reflux    Subchondral insufficiency fracture of condyle of femur (HCC)    Tension headache     Assessment: Pt is a 73 yo female presenting to ED for CP, found with "extensive pulmonary emboli worse on the right than the left without evidence of right heart strain."  0415 0758 HL >1.10, SUPRAtherapeutic    0415 1710 HL 0.66, therapeutic x 1  Goal of Therapy:  Heparin level 0.3-0.7 units/ml Monitor platelets by anticoagulation protocol: Yes   Plan:  Continue heparin infusion at 1100 units/hr Will check confirmatory HL in 8 hr  CBC daily while on heparin  Dilana Mcphie A [place] Hanlon, PharmD Clinical Pharmacist  10/19/2023 6:15 PM

## 2023-10-19 NOTE — ED Notes (Signed)
 Per pharmacy, hold heparin for 1 hour. Heparin paused.

## 2023-10-19 NOTE — ED Notes (Signed)
 Daughter at bedside. Pt resting comfortably. Respirations unlabored.

## 2023-10-19 NOTE — ED Notes (Signed)
 Vascular surgeon spoke to pt at bedside, thrombectomy is tomorrow. Wrote to hospitalist to inquire about diet order.

## 2023-10-19 NOTE — ED Notes (Signed)
 Helped pt walk to toilet to void.

## 2023-10-19 NOTE — ED Notes (Signed)
Dr Agbata at bedside. 

## 2023-10-19 NOTE — Consult Note (Signed)
 Hospital Consult    Reason for Consult:  Bilateral Pulmonary Embolisms Requesting Physician:  Dr Lucile Shutters MD MRN #:  [id number]  History of Present Illness: This is a 73 y.o. female with medical history significant of DVT not on anticoagulants, HTN, HLD, DM, CAD, dCHF, morbid obesity, diverticulitis, s/p of colon resection, overactive bladder, migraine, who presents with chest pain. Patient states that her chest pain started in the afternoon at 6:15 PM. It is located in the front chest, constant, sharp, severe, radiating to the back.   Upon workup the patient was found to have a white blood cell count of 10.4 creatinine 1.09 BUN 16 GFR of 54 troponins were 15.  Blood pressure was 109/66, heart rate 80, oxygen saturation 96% on room air.  X-ray was negative.  Patient underwent a CTA of the chest for pulmonary embolisms.  She was found to be positive for an extensive pulmonary emboli worse on the right than on the left without evidence of right heart strain.  Patient also had an ultrasound of her bilateral lower extremities.  Patient is noted to have an extensive nonocclusive DVT in her right leg that extends from the common femoral all the way down through her tibial veins.  Vascular surgery was then consulted to evaluate.  Past Medical History:  Diagnosis Date   Actinic keratosis    Arthritis of knee    Benign essential HTN    Chest pain    a. she reports 4 normal heart caths; b. 12/2013 [facility]: nl Gavin Potters); c. 04/2016 MV: apical and ant defect @ rest consistent w/ breast attenuation. No ischemia. EF 55-65%.   Depression    Diverticulitis of intestine w/o perforation or abscess w/o bleeding    DVT (deep venous thrombosis) (HCC)    Dysphagia    Fibromyalgia    High cholesterol    History of colon resection    History of echocardiogram    a. 01/2017 Echo ([facility]): nl LV syst fxn.   Hyperlipidemia    Medial meniscus tear    Migraine    Neuromuscular disorder (HCC)    Obesity     Overactive bladder    Reflux    Subchondral insufficiency fracture of condyle of femur (HCC)    Tension headache     Past Surgical History:  Procedure Laterality Date   ABDOMINAL HYSTERECTOMY     BREAST BIOPSY Right    CYST REMOVED-16-48YRS AGO-NEG   BREAST BIOPSY Left 03/24/2021   stereo bx of calcs UOQ anterior #1, coil marker, benign   BREAST BIOPSY Left 03/24/2021   stereo bx calcs UOQ posterior #2, x marker, benign   CARDIAC CATHETERIZATION  1995   [facility]   CARDIAC CAT[facility]TERIZATION  2001   [facility]   CARDIAC CATHETERIZATION  2006   [facility]   CARDIAC CATHETERIZATION     [facility] with Dr. Gwen Pounds    COLON RESECTION     ESOPHAGOGASTRODUODENOSCOPY (EGD) WITH PROPOFOL N/A 02/06/2022   Procedure: ESOPHAGOGASTRODUODENOSCOPY (EGD) WITH PROPOFOL;  Surgeon: Regis Bill, MD;  Location: [facility] ENDOSCOPY;  Service: Endoscopy;  Laterality: N/A;   FOOT SURGERY Left 01/10/2016   03/06/16 second foot surgery   KNEE SURGERY     REVISION TOTAL HIP ARTHROPLASTY     June 2019   SHOULDER SURGERY      Allergies  Allergen Reactions   Chlorpromazine Anaphylaxis and Other (See Comments)    Other Reaction: LOC Other Reaction: LOC Other Reaction: LOC Other Reaction: LOC  Metoclopramide Other (See Comments) and Anaphylaxis    Other Reaction: RESTLESS Twitching , cant sit still  Other Reaction: RESTLESS Other Reaction: RESTLESS Other Reaction: RESTLESS    Sulfamethizole Nausea And Vomiting and Other (See Comments)   Amlodipine     BLLE edema   Other Other (See Comments)    Torazine gave her syncope Upset stomach Upset stomach    Celecoxib Nausea Only and Anxiety   Meloxicam Other (See Comments) and Diarrhea    Prior to Admission medications   Medication Sig Start Date End Date Taking? Authorizing Provider  carvedilol (COREG) 6.25 MG tablet Take 6.25 mg by mouth 2 (two) times daily with a meal.   Yes [provider]  cyanocobalamin (VITAMIN B12)  1000 MCG/ML injection Inject 1,000 mcg into the muscle every 30 (thirty) days. 06/01/22  Yes [provider]  dicyclomine (BENTYL) 10 MG capsule Take 10 mg by mouth 2 (two) times daily as needed. 08/19/20  Yes [provider]  ezetimibe (ZETIA) 10 MG tablet Take 10 mg by mouth daily.   Yes [provider]  hydrochlorothiazide (HYDRODIURIL) 25 MG tablet Take 1 tablet (25 mg total) by mouth daily. 04/20/23  Yes End, Cristal Deer, MD  losartan (COZAAR) 100 MG tablet Take 100 mg by mouth daily.   Yes [provider]  nitroGLYCERIN (NITROSTAT) 0.4 MG SL tablet Place 1 tablet (0.4 mg total) under the tongue every 5 (five) minutes as needed for chest pain (maximum of 3 doses.). 04/19/23  Yes Wittenborn, Gavin Pound, NP  omeprazole (PRILOSEC) 20 MG capsule Take 20 mg by mouth daily.   Yes [provider]  pravastatin (PRAVACHOL) 80 MG tablet Take 1 tablet (80 mg total) by mouth daily. 04/20/23  Yes End, Cristal Deer, MD  REPATHA SURECLICK 140 MG/ML SOAJ Inject 140 mg into the skin every 14 (fourteen) days.   Yes [provider]  Semaglutide,0.25 or 0.5MG /DOS, (OZEMPIC, 0.25 OR 0.5 MG/DOSE,) 2 MG/1.5ML SOPN Inject 0.5 mg into the skin once a week.   Yes [provider]  fluticasone (FLONASE) 50 MCG/ACT nasal spray Place 2 sprays into both nostrils daily. Patient not taking: Reported on 04/19/2023 08/09/21   Rushie Chestnut, PA-C  hydrALAZINE (APRESOLINE) 25 MG tablet Take 25 mg by mouth 2 (two) times daily.    [provider]  methylPREDNISolone (MEDROL DOSEPAK) 4 MG TBPK tablet 6 day dose pack - take as directed Patient not taking: Reported on 04/19/2023 07/28/22   Hyatt, Max T, DPM  mupirocin ointment (BACTROBAN) 2 % Apply 1 Application topically daily. Qd to excision site Patient not taking: Reported on 04/19/2023 02/23/23   Deirdre Evener, MD    Social History   Socioeconomic History   Marital status: Married    Spouse name: Not on  file   Number of children: Not on file   Years of education: Not on file   Highest education level: Not on file  Occupational History   Not on file  Tobacco Use   Smoking status: Never   Smokeless tobacco: Never  Vaping Use   Vaping status: Never Used  Substance and Sexual Activity   Alcohol use: No   Drug use: No   Sexual activity: Not on file  Other Topics Concern   Not on file  Social History Narrative   Not on file   Social Drivers of Health   Financial Resource Strain: Low Risk  (12/27/2022)   Received from [facility], [facility]  Overall Financial Resource Strain (CARDIA)    Difficulty of Paying Living Expenses: Not hard at all  Food Insecurity: No Food Insecurity (10/19/2023)   Hunger Vital Sign    Worried About Running Out of Food in the Last Year: Never true    Ran Out of Food in the Last Year: Never true  Transportation Needs: No Transportation Needs (10/19/2023)   PRAPARE - Administrator, Civil Service (Medical): No    Lack of Transportation (Non-Medical): No  Physical Activity: Not on file  Stress: Not on file  Social Connections: Moderately Isolated (10/19/2023)   Social Connection and Isolation Panel [NHANES]    Frequency of Communication with Friends and Family: More than three times a week    Frequency of Social Gatherings with Friends and Family: More than three times a week    Attends Religious Services: Never    Database administrator or Organizations: No    Attends Banker Meetings: Never    Marital Status: Married  Catering manager Violence: Not At Risk (10/19/2023)   Humiliation, Afraid, Rape, and Kick questionnaire    Fear of Current or Ex-Partner: No    Emotionally Abused: No    Physically Abused: No    Sexually Abused: No     Family History  Problem Relation Age of Onset   Breast cancer Maternal Aunt 40   Heart attack Mother    Heart disease Mother        pacemaker/ICD    Heart attack Father    Stroke Father    Lung cancer Father    Heart disease Brother        stents in early 51's    ROS: Otherwise negative unless mentioned in HPI  Physical Examination  Vitals:   10/19/23 0558 10/19/23 0700  BP: 139/67 138/73  Pulse: 74 66  Resp: 15   Temp: 98.1 F (36.7 C)   SpO2: 97% 94%   Body mass index is 41.47 kg/m.  General:  WDWN in NAD Gait: Not observed HENT: WNL, normocephalic Pulmonary: normal non-labored breathing, without Rales, rhonchi,  wheezing Cardiac: regular, without  Murmurs, rubs or gallops; without carotid bruits Abdomen: Positive bowel sounds throughout, soft, NT/ND, no masses Skin: without rashes Vascular Exam/Pulses: Palpable Pulses throughout.  Extremities: without ischemic changes, without Gangrene , without cellulitis; without open wounds;  Musculoskeletal: no muscle wasting or atrophy  Neurologic: A&O X 3;  No focal weakness or paresthesias are detected; speech is fluent/normal Psychiatric:  The pt has Normal affect. Lymph:  Unremarkable  CBC    Component Value Date/Time   WBC 8.0 10/19/2023 0758   RBC 4.40 10/19/2023 0758   HGB 14.2 10/19/2023 0758   HGB 16.2 (H) 09/09/2020 1513   HCT 40.7 10/19/2023 0758   HCT 47.7 (H) 09/09/2020 1513   PLT 164 10/19/2023 0758   PLT 188 09/09/2020 1513   MCV 92.5 10/19/2023 0758   MCV 93 09/09/2020 1513   MCV 93 10/26/2014 2240   MCH 32.3 10/19/2023 0758   MCHC 34.9 10/19/2023 0758   RDW 13.9 10/19/2023 0758   RDW 12.6 09/09/2020 1513   RDW 13.5 10/26/2014 2240   LYMPHSABS 2.5 10/26/2014 2240   MONOABS 0.7 10/26/2014 2240   EOSABS 0.4 10/26/2014 2240   BASOSABS 0.1 10/26/2014 2240    BMET    Component Value Date/Time   NA 136 10/19/2023 0758   NA 141 05/07/2021 1018   NA 137 10/26/2014 2240   K 3.7 10/19/2023  0758   K 4.0 10/26/2014 2240   CL 102 10/19/2023 0758   CL 102 10/26/2014 2240   CO2 26 10/19/2023 0758   CO2 25 10/26/2014 2240   GLUCOSE 155 (H)  10/19/2023 0758   GLUCOSE 174 (H) 10/26/2014 2240   BUN 19 10/19/2023 0758   BUN 11 05/07/2021 1018   BUN 13 10/26/2014 2240   CREATININE 1.21 (H) 10/19/2023 0758   CREATININE 1.02 (H) 10/26/2014 2240   CALCIUM 8.6 (L) 10/19/2023 0758   CALCIUM 9.1 10/26/2014 2240   GFRNONAA 48 (L) 10/19/2023 0758   GFRNONAA 58 (L) 10/26/2014 2240   GFRAA >60 12/27/2019 1210   GFRAA >60 10/26/2014 2240    COAGS: Lab Results  Component Value Date   INR 1.1 10/18/2023   INR 1.1 07/14/2012   INR 1.0 07/13/2012     Non-Invasive Vascular Imaging:   EXAM: BILATERAL LOWER EXTREMITY VENOUS DOPPLER ULTRASOUND   TECHNIQUE: Gray-scale sonography with graded compression, as well as color Doppler and duplex ultrasound were performed to evaluate the lower extremity deep venous systems from the level of the common femoral vein and including the common femoral, femoral, profunda femoral, popliteal and calf veins including the posterior tibial, peroneal and gastrocnemius veins when visible. The superficial great saphenous vein was also interrogated. Spectral Doppler was utilized to evaluate flow at rest and with distal augmentation maneuvers in the common femoral, femoral and popliteal veins.   COMPARISON:  12/30/2022   FINDINGS: RIGHT LOWER EXTREMITY   Common Femoral Vein: Nonocclusive thrombus.   Saphenofemoral Junction: No evidence of thrombus. Normal compressibility and flow on color Doppler imaging.   Profunda Femoral Vein: No evidence of thrombus. Normal compressibility and flow on color Doppler imaging.   Femoral Vein: Nonocclusive thrombus   Popliteal Vein: Nonocclusive thrombus   Calf Veins: Nonocclusive thrombus   Other Findings:  None.   LEFT LOWER EXTREMITY   Common Femoral Vein: No evidence of thrombus. Normal compressibility, respiratory phasicity and response to augmentation.   Saphenofemoral Junction: No evidence of thrombus. Normal compressibility and flow on color  Doppler imaging.   Profunda Femoral Vein: No evidence of thrombus. Normal compressibility and flow on color Doppler imaging.   Femoral Vein: No evidence of thrombus. Normal compressibility, respiratory phasicity and response to augmentation.   Popliteal Vein: No evidence of thrombus. Normal compressibility, respiratory phasicity and response to augmentation.   Calf Veins: No evidence of thrombus. Normal compressibility and flow on color Doppler imaging.   Superficial Great Saphenous Vein: No evidence of thrombus. Normal compressibility.   Venous Reflux:  None.   Other Findings:  None.   IMPRESSION: 1. Exam is positive for nonocclusive DVT in the right common femoral, femoral, popliteal, and calf veins in this patient with acute pulmonary embolus diagnosed by CTA chest 7 hours ago. 2. No evidence for DVT in the left lower extremity.  EXAM: CT ANGIOGRAPHY CHEST WITH CONTRAST   TECHNIQUE: Multidetector CT imaging of the chest was performed using the standard protocol during bolus administration of intravenous contrast. Multiplanar CT image reconstructions and MIPs were obtained to evaluate the vascular anatomy.   RADIATION DOSE REDUCTION: This exam was performed according to the departmental dose-optimization program which includes automated exposure control, adjustment of the mA and/or kV according to patient size and/or use of iterative reconstruction technique.   CONTRAST:  100mL OMNIPAQUE IOHEXOL 350 MG/ML SOLN   COMPARISON:  None Available.   FINDINGS: Cardiovascular: Thoracic aorta shows no aneurysmal dilatation or dissection. Mild coronary calcifications are  seen. Pulmonary artery is well visualized with extensive bilateral pulmonary emboli worse on the right than the left. The main right pulmonary artery is filled with thrombus. No definitive right heart strain is noted.   Mediastinum/Nodes: Thoracic inlet is within normal limits. No hilar or mediastinal  adenopathy is noted. The esophagus as visualized is within normal limits. Small sliding-type hiatal hernia is noted.   Lungs/Pleura: Lungs are well aerated bilaterally. No focal confluent infiltrate or effusion is seen. Minimal left basilar atelectasis is noted.   Upper Abdomen: Fatty infiltration of the liver is noted. An anterior abdominal wall hernia is noted containing a loop transverse colon. No obstructive changes are noted.   Musculoskeletal: No chest wall abnormality. No acute or significant osseous findings.   Review of the MIP images confirms the above findings.   IMPRESSION: Extensive pulmonary emboli worse on the right than the left without evidence of right heart strain.   Anterior abdominal wall hernia on the left containing a loop of transverse colon without obstructive change.   Chronic changes as described above.   Critical Value/emergent results were called by telephone at the time of interpretation on 10/18/2023 at 11:29 pm to Dr. MICHAEL MIAN , who verbally acknowledged these results.  Statin:  Yes.   Beta Blocker:  Yes.   Aspirin:  No. ACEI:  No. ARB:  Yes.   CCB use:  No Other antiplatelets/anticoagulants:  No.    ASSESSMENT/PLAN: This is a 73 y.o. female who presents to [facility] emergency department with chest pain.  Patient has a significant medical history of a DVT but was not on any anticoagulation.  She did not complain of any shortness of breath but she does endorse that she is not moving well after hip surgery.  She had seen someone in orthopedics repeatedly for sciatic pain.  Upon workup she was noted to have right lower extremity extensive nonocclusive DVT which starts at her right common femoral vein all the way through her tibial veins.  She is also noted to have bilateral pulmonary embolisms worse on the right than the left.  Vascular surgery plans on taking the patient to the vascular lab on 10/20/2023 for a pulmonary thrombectomy.  I discussed  at the bedside with the patient in detail this morning the procedure, benefits, risk, and complications.  Patient verbalizes her understanding and wishes to proceed.  I answered all the patient's questions.  Patient will be made n.p.o. after midnight tonight for procedure tomorrow.  She will continue to be on heparin infusion as prior ordered. - I discussed the case in detail with Dr. Mikki Alexander MD and he agrees with the plan   Annamaria [facility]rette [facility] 10/19/2023 9:04 AM

## 2023-10-19 NOTE — H&P (View-Only) (Signed)
 Hospital Consult    Reason for Consult:  Bilateral Pulmonary Embolisms Requesting Physician:  Dr Lucile Shutters MD MRN #:  [id number]  History of Present Illness: This is a 73 y.o. female with medical history significant of DVT not on anticoagulants, HTN, HLD, DM, CAD, dCHF, morbid obesity, diverticulitis, s/p of colon resection, overactive bladder, migraine, who presents with chest pain. Patient states that her chest pain started in the afternoon at 6:15 PM. It is located in the front chest, constant, sharp, severe, radiating to the back.   Upon workup the patient was found to have a white blood cell count of 10.4 creatinine 1.09 BUN 16 GFR of 54 troponins were 15.  Blood pressure was 109/66, heart rate 80, oxygen saturation 96% on room air.  X-ray was negative.  Patient underwent a CTA of the chest for pulmonary embolisms.  She was found to be positive for an extensive pulmonary emboli worse on the right than on the left without evidence of right heart strain.  Patient also had an ultrasound of her bilateral lower extremities.  Patient is noted to have an extensive nonocclusive DVT in her right leg that extends from the common femoral all the way down through her tibial veins.  Vascular surgery was then consulted to evaluate.  Past Medical History:  Diagnosis Date   Actinic keratosis    Arthritis of knee    Benign essential HTN    Chest pain    a. she reports 4 normal heart caths; b. 12/2013 St [facility]: nl Gavin Potters); c. 04/2016 MV: apical and ant defect @ rest consistent w/ breast attenuation. No ischemia. EF 55-65%.   Depression    Diverticulitis of intestine w/o perforation or abscess w/o bleeding    DVT (deep venous thrombosis) (HCC)    Dysphagia    Fibromyalgia    High cholesterol    History of colon resection    History of echocardiogram    a. 01/2017 Echo ([facility]): nl LV syst fxn.   Hyperlipidemia    Medial meniscus tear    Migraine    Neuromuscular disorder (HCC)    Obesity     Overactive bladder    Reflux    Subchondral insufficiency fracture of condyle of femur (HCC)    Tension headache     Past Surgical History:  Procedure Laterality Date   ABDOMINAL HYSTERECTOMY     BREAST BIOPSY Right    CYST REMOVED-16-48YRS AGO-NEG   BREAST BIOPSY Left 03/24/2021   stereo bx of calcs UOQ anterior #1, coil marker, benign   BREAST BIOPSY Left 03/24/2021   stereo bx calcs UOQ posterior #2, x marker, benign   CARDIAC CATHETERIZATION  1995   [facility]   CARDIAC CATHETERIZATION  2001   [facility]   CARDIAC CATHETERIZATION  2006   [facility]   CARDIAC CATHETERIZATION     [facility] with Dr. Gwen Pounds    COLON RESECTION     ESOPHAGOGASTRODUODENOSCOPY (EGD) WITH PROPOFOL N/A 02/06/2022   Procedure: ESOPHAGOGASTRODUODENOSCOPY (EGD) WITH PROPOFOL;  Surgeon: Regis Bill, MD;  Location: [facility] ENDOSCOPY;  Service: Endoscopy;  Laterality: N/A;   FOOT SURGERY Left 01/10/2016   03/06/16 second foot surgery   KNEE SURGERY     REVISION TOTAL HIP ARTHROPLASTY     June 2019   SHOULDER SURGERY      Allergies  Allergen Reactions   Chlorpromazine Anaphylaxis and Other (See Comments)    Other Reaction: LOC Other Reaction: LOC Other Reaction: LOC Other Reaction: LOC  Metoclopramide Other (See Comments) and Anaphylaxis    Other Reaction: RESTLESS Twitching , cant sit still  Other Reaction: RESTLESS Other Reaction: RESTLESS Other Reaction: RESTLESS    Sulfamethizole Nausea And Vomiting and Other (See Comments)   Amlodipine     BLLE edema   Other Other (See Comments)    Torazine gave her syncope Upset stomach Upset stomach    Celecoxib Nausea Only and Anxiety   Meloxicam Other (See Comments) and Diarrhea    Prior to Admission medications   Medication Sig Start Date End Date Taking? Authorizing Provider  carvedilol (COREG) 6.25 MG tablet Take 6.25 mg by mouth 2 (two) times daily with a meal.   Yes [provider]  cyanocobalamin (VITAMIN B12)  1000 MCG/ML injection Inject 1,000 mcg into the muscle every 30 (thirty) days. 06/01/22  Yes [provider]  dicyclomine (BENTYL) 10 MG capsule Take 10 mg by mouth 2 (two) times daily as needed. 08/19/20  Yes [provider]  ezetimibe (ZETIA) 10 MG tablet Take 10 mg by mouth daily.   Yes [provider]  hydrochlorothiazide (HYDRODIURIL) 25 MG tablet Take 1 tablet (25 mg total) by mouth daily. 04/20/23  Yes End, Cristal Deer, MD  losartan (COZAAR) 100 MG tablet Take 100 mg by mouth daily.   Yes [provider]  nitroGLYCERIN (NITROSTAT) 0.4 MG SL tablet Place 1 tablet (0.4 mg total) under the tongue every 5 (five) minutes as needed for chest pain (maximum of 3 doses.). 04/19/23  Yes Wittenborn, Gavin Pound, NP  omeprazole (PRILOSEC) 20 MG capsule Take 20 mg by mouth daily.   Yes [provider]  pravastatin (PRAVACHOL) 80 MG tablet Take 1 tablet (80 mg total) by mouth daily. 04/20/23  Yes End, Cristal Deer, MD  REPATHA SURECLICK 140 MG/ML SOAJ Inject 140 mg into the skin every 14 (fourteen) days.   Yes [provider]  Semaglutide,0.25 or 0.5MG /DOS, (OZEMPIC, 0.25 OR 0.5 MG/DOSE,) 2 MG/1.5ML SOPN Inject 0.5 mg into the skin once a week.   Yes [provider]  fluticasone (FLONASE) 50 MCG/ACT nasal spray Place 2 sprays into both nostrils daily. Patient not taking: Reported on 04/19/2023 08/09/21   Rushie Chestnut, PA-C  hydrALAZINE (APRESOLINE) 25 MG tablet Take 25 mg by mouth 2 (two) times daily.    [provider]  methylPREDNISolone (MEDROL DOSEPAK) 4 MG TBPK tablet 6 day dose pack - take as directed Patient not taking: Reported on 04/19/2023 07/28/22   Hyatt, Max T, DPM  mupirocin ointment (BACTROBAN) 2 % Apply 1 Application topically daily. Qd to excision site Patient not taking: Reported on 04/19/2023 02/23/23   Deirdre Evener, MD    Social History   Socioeconomic History   Marital status: Married    Spouse name: Not on  file   Number of children: Not on file   Years of education: Not on file   Highest education level: Not on file  Occupational History   Not on file  Tobacco Use   Smoking status: Never   Smokeless tobacco: Never  Vaping Use   Vaping status: Never Used  Substance and Sexual Activity   Alcohol use: No   Drug use: No   Sexual activity: Not on file  Other Topics Concern   Not on file  Social History Narrative   Not on file   Social Drivers of Health   Financial Resource Strain: Low Risk  (12/27/2022)   Received from [facility], [facility]  Overall Financial Resource Strain (CARDIA)    Difficulty of Paying Living Expenses: Not hard at all  Food Insecurity: No Food Insecurity (10/19/2023)   Hunger Vital Sign    Worried About Running Out of Food in the Last Year: Never true    Ran Out of Food in the Last Year: Never true  Transportation Needs: No Transportation Needs (10/19/2023)   PRAPARE - Administrator, Civil Service (Medical): No    Lack of Transportation (Non-Medical): No  Physical Activity: Not on file  Stress: Not on file  Social Connections: Moderately Isolated (10/19/2023)   Social Connection and Isolation Panel [NHANES]    Frequency of Communication with Friends and Family: More than three times a week    Frequency of Social Gatherings with Friends and Family: More than three times a week    Attends Religious Services: Never    Database administrator or Organizations: No    Attends Banker Meetings: Never    Marital Status: Married  Catering manager Violence: Not At Risk (10/19/2023)   Humiliation, Afraid, Rape, and Kick questionnaire    Fear of Current or Ex-Partner: No    Emotionally Abused: No    Physically Abused: No    Sexually Abused: No     Family History  Problem Relation Age of Onset   Breast cancer Maternal Aunt 40   Heart attack Mother    Heart disease Mother        pacemaker/ICD    Heart attack Father    Stroke Father    Lung cancer Father    Heart disease Brother        stents in early 51's    ROS: Otherwise negative unless mentioned in HPI  Physical Examination  Vitals:   10/19/23 0558 10/19/23 0700  BP: 139/67 138/73  Pulse: 74 66  Resp: 15   Temp: 98.1 F (36.7 C)   SpO2: 97% 94%   Body mass index is 41.47 kg/m.  General:  WDWN in NAD Gait: Not observed HENT: WNL, normocephalic Pulmonary: normal non-labored breathing, without Rales, rhonchi,  wheezing Cardiac: regular, without  Murmurs, rubs or gallops; without carotid bruits Abdomen: Positive bowel sounds throughout, soft, NT/ND, no masses Skin: without rashes Vascular Exam/Pulses: Palpable Pulses throughout.  Extremities: without ischemic changes, without Gangrene , without cellulitis; without open wounds;  Musculoskeletal: no muscle wasting or atrophy  Neurologic: A&O X 3;  No focal weakness or paresthesias are detected; speech is fluent/normal Psychiatric:  The pt has Normal affect. Lymph:  Unremarkable  CBC    Component Value Date/Time   WBC 8.0 10/19/2023 0758   RBC 4.40 10/19/2023 0758   HGB 14.2 10/19/2023 0758   HGB 16.2 (H) 09/09/2020 1513   HCT 40.7 10/19/2023 0758   HCT 47.7 (H) 09/09/2020 1513   PLT 164 10/19/2023 0758   PLT 188 09/09/2020 1513   MCV 92.5 10/19/2023 0758   MCV 93 09/09/2020 1513   MCV 93 10/26/2014 2240   MCH 32.3 10/19/2023 0758   MCHC 34.9 10/19/2023 0758   RDW 13.9 10/19/2023 0758   RDW 12.6 09/09/2020 1513   RDW 13.5 10/26/2014 2240   LYMPHSABS 2.5 10/26/2014 2240   MONOABS 0.7 10/26/2014 2240   EOSABS 0.4 10/26/2014 2240   BASOSABS 0.1 10/26/2014 2240    BMET    Component Value Date/Time   NA 136 10/19/2023 0758   NA 141 05/07/2021 1018   NA 137 10/26/2014 2240   K 3.7 10/19/2023  0758   K 4.0 10/26/2014 2240   CL 102 10/19/2023 0758   CL 102 10/26/2014 2240   CO2 26 10/19/2023 0758   CO2 25 10/26/2014 2240   GLUCOSE 155 (H)  10/19/2023 0758   GLUCOSE 174 (H) 10/26/2014 2240   BUN 19 10/19/2023 0758   BUN 11 05/07/2021 1018   BUN 13 10/26/2014 2240   CREATININE 1.21 (H) 10/19/2023 0758   CREATININE 1.02 (H) 10/26/2014 2240   CALCIUM 8.6 (L) 10/19/2023 0758   CALCIUM 9.1 10/26/2014 2240   GFRNONAA 48 (L) 10/19/2023 0758   GFRNONAA 58 (L) 10/26/2014 2240   GFRAA >60 12/27/2019 1210   GFRAA >60 10/26/2014 2240    COAGS: Lab Results  Component Value Date   INR 1.1 10/18/2023   INR 1.1 07/14/2012   INR 1.0 07/13/2012     Non-Invasive Vascular Imaging:   EXAM: BILATERAL LOWER EXTREMITY VENOUS DOPPLER ULTRASOUND   TECHNIQUE: Gray-scale sonography with graded compression, as well as color Doppler and duplex ultrasound were performed to evaluate the lower extremity deep venous systems from the level of the common femoral vein and including the common femoral, femoral, profunda femoral, popliteal and calf veins including the posterior tibial, peroneal and gastrocnemius veins when visible. The superficial great saphenous vein was also interrogated. Spectral Doppler was utilized to evaluate flow at rest and with distal augmentation maneuvers in the common femoral, femoral and popliteal veins.   COMPARISON:  12/30/2022   FINDINGS: RIGHT LOWER EXTREMITY   Common Femoral Vein: Nonocclusive thrombus.   Saphenofemoral Junction: No evidence of thrombus. Normal compressibility and flow on color Doppler imaging.   Profunda Femoral Vein: No evidence of thrombus. Normal compressibility and flow on color Doppler imaging.   Femoral Vein: Nonocclusive thrombus   Popliteal Vein: Nonocclusive thrombus   Calf Veins: Nonocclusive thrombus   Other Findings:  None.   LEFT LOWER EXTREMITY   Common Femoral Vein: No evidence of thrombus. Normal compressibility, respiratory phasicity and response to augmentation.   Saphenofemoral Junction: No evidence of thrombus. Normal compressibility and flow on color  Doppler imaging.   Profunda Femoral Vein: No evidence of thrombus. Normal compressibility and flow on color Doppler imaging.   Femoral Vein: No evidence of thrombus. Normal compressibility, respiratory phasicity and response to augmentation.   Popliteal Vein: No evidence of thrombus. Normal compressibility, respiratory phasicity and response to augmentation.   Calf Veins: No evidence of thrombus. Normal compressibility and flow on color Doppler imaging.   Superficial Great Saphenous Vein: No evidence of thrombus. Normal compressibility.   Venous Reflux:  None.   Other Findings:  None.   IMPRESSION: 1. Exam is positive for nonocclusive DVT in the right common femoral, femoral, popliteal, and calf veins in this patient with acute pulmonary embolus diagnosed by CTA chest 7 hours ago. 2. No evidence for DVT in the left lower extremity.  EXAM: CT ANGIOGRAPHY CHEST WITH CONTRAST   TECHNIQUE: Multidetector CT imaging of the chest was performed using the standard protocol during bolus administration of intravenous contrast. Multiplanar CT image reconstructions and MIPs were obtained to evaluate the vascular anatomy.   RADIATION DOSE REDUCTION: This exam was performed according to the departmental dose-optimization program which includes automated exposure control, adjustment of the mA and/or kV according to patient size and/or use of iterative reconstruction technique.   CONTRAST:  100mL OMNIPAQUE IOHEXOL 350 MG/ML SOLN   COMPARISON:  None Available.   FINDINGS: Cardiovascular: Thoracic aorta shows no aneurysmal dilatation or dissection. Mild coronary calcifications are  seen. Pulmonary artery is well visualized with extensive bilateral pulmonary emboli worse on the right than the left. The main right pulmonary artery is filled with thrombus. No definitive right heart strain is noted.   Mediastinum/Nodes: Thoracic inlet is within normal limits. No hilar or mediastinal  adenopathy is noted. The esophagus as visualized is within normal limits. Small sliding-type hiatal hernia is noted.   Lungs/Pleura: Lungs are well aerated bilaterally. No focal confluent infiltrate or effusion is seen. Minimal left basilar atelectasis is noted.   Upper Abdomen: Fatty infiltration of the liver is noted. An anterior abdominal wall hernia is noted containing a loop transverse colon. No obstructive changes are noted.   Musculoskeletal: No chest wall abnormality. No acute or significant osseous findings.   Review of the MIP images confirms the above findings.   IMPRESSION: Extensive pulmonary emboli worse on the right than the left without evidence of right heart strain.   Anterior abdominal wall hernia on the left containing a loop of transverse colon without obstructive change.   Chronic changes as described above.   Critical Value/emergent results were called by telephone at the time of interpretation on 10/18/2023 at 11:29 pm to Dr. Herma Mering , who verbally acknowledged these results.  Statin:  Yes.   Beta Blocker:  Yes.   Aspirin:  No. ACEI:  No. ARB:  Yes.   CCB use:  No Other antiplatelets/anticoagulants:  No.    ASSESSMENT/PLAN: This is a 73 y.o. female who presents to [facility] emergency department with chest pain.  Patient has a significant medical history of a DVT but was not on any anticoagulation.  She did not complain of any shortness of breath but she does endorse that she is not moving well after hip surgery.  She had seen someone in orthopedics repeatedly for sciatic pain.  Upon workup she was noted to have right lower extremity extensive nonocclusive DVT which starts at her right common femoral vein all the way through her tibial veins.  She is also noted to have bilateral pulmonary embolisms worse on the right than the left.  Vascular surgery plans on taking the patient to the vascular lab on 10/20/2023 for a pulmonary thrombectomy.  I discussed  at the bedside with the patient in detail this morning the procedure, benefits, risk, and complications.  Patient verbalizes her understanding and wishes to proceed.  I answered all the patient's questions.  Patient will be made n.p.o. after midnight tonight for procedure tomorrow.  She will continue to be on heparin infusion as prior ordered. - I discussed the case in detail with Dr. Festus Barren MD and he agrees with the plan   Marcie [facility] 10/19/2023 9:04 AM

## 2023-10-20 ENCOUNTER — Other Ambulatory Visit (HOSPITAL_COMMUNITY): Payer: Self-pay

## 2023-10-20 ENCOUNTER — Encounter
Admission: EM | Disposition: A | Discharge status: Home Health | Payer: Self-pay | Source: Home / Self Care | Attending: Internal Medicine

## 2023-10-20 ENCOUNTER — Telehealth (HOSPITAL_COMMUNITY): Payer: Self-pay | Admitting: Pharmacy Technician

## 2023-10-20 DIAGNOSIS — I2699 Other pulmonary embolism without acute cor pulmonale: Secondary | ICD-10-CM | POA: Diagnosis not present

## 2023-10-20 HISTORY — PX: PULMONARY THROMBECTOMY: CATH118295

## 2023-10-20 LAB — CBC
HCT: 38.9 % (ref 36.0–46.0)
Hemoglobin: 13.4 g/dL (ref 12.0–15.0)
MCH: 31.5 pg (ref 26.0–34.0)
MCHC: 34.4 g/dL (ref 30.0–36.0)
MCV: 91.5 fL (ref 80.0–100.0)
Platelets: 162 10*3/uL (ref 150–400)
RBC: 4.25 MIL/uL (ref 3.87–5.11)
RDW: 13.9 % (ref 11.5–15.5)
WBC: 8.8 10*3/uL (ref 4.0–10.5)
nRBC: 0 % (ref 0.0–0.2)

## 2023-10-20 LAB — GLUCOSE, CAPILLARY
Glucose-Capillary: 132 mg/dL — ABNORMAL HIGH (ref 70–99)
Glucose-Capillary: 131 mg/dL — ABNORMAL HIGH (ref 70–99)
Glucose-Capillary: 150 mg/dL — ABNORMAL HIGH (ref 70–99)
Glucose-Capillary: 141 mg/dL — ABNORMAL HIGH (ref 70–99)
Glucose-Capillary: 112 mg/dL — ABNORMAL HIGH (ref 70–99)
Glucose-Capillary: 147 mg/dL — ABNORMAL HIGH (ref 70–99)

## 2023-10-20 LAB — HEPARIN LEVEL (UNFRACTIONATED): Heparin Unfractionated: 0.62 [IU]/mL (ref 0.30–0.70)

## 2023-10-20 SURGERY — PULMONARY THROMBECTOMY
Anesthesia: Moderate Sedation | Laterality: Bilateral

## 2023-10-20 MED ORDER — CEFAZOLIN SODIUM-DEXTROSE 2-4 GM/100ML-% IV SOLN
INTRAVENOUS | Status: AC
  Filled 2023-10-20: qty 100

## 2023-10-20 MED ORDER — FENTANYL CITRATE (PF) 100 MCG/2ML IJ SOLN
INTRAMUSCULAR | Status: DC | PRN
  Administered 2023-10-20 (×2): 50 ug via INTRAVENOUS
  Administered 2023-10-20: 12.5 ug via INTRAVENOUS

## 2023-10-20 MED ORDER — MIDAZOLAM HCL 5 MG/5ML IJ SOLN
INTRAMUSCULAR | Status: AC
  Filled 2023-10-20: qty 5

## 2023-10-20 MED ORDER — LIDOCAINE-EPINEPHRINE (PF) 1 %-1:200000 IJ SOLN
INTRAMUSCULAR | Status: DC | PRN
  Administered 2023-10-20: 10 mL

## 2023-10-20 MED ORDER — FENTANYL CITRATE (PF) 100 MCG/2ML IJ SOLN
INTRAMUSCULAR | Status: AC
  Filled 2023-10-20: qty 2

## 2023-10-20 MED ORDER — IODIXANOL 320 MG/ML IV SOLN
INTRAVENOUS | Status: DC | PRN
  Administered 2023-10-20: 45 mL

## 2023-10-20 MED ORDER — HEPARIN (PORCINE) IN NACL 1000-0.9 UT/500ML-% IV SOLN
INTRAVENOUS | Status: DC | PRN
  Administered 2023-10-20: 1000 mL

## 2023-10-20 MED ORDER — HEPARIN SODIUM (PORCINE) 1000 UNIT/ML IJ SOLN
INTRAMUSCULAR | Status: AC
  Filled 2023-10-20: qty 10

## 2023-10-20 MED ORDER — MIDAZOLAM HCL 2 MG/2ML IJ SOLN
INTRAMUSCULAR | Status: DC | PRN
  Administered 2023-10-20: 1 mg via INTRAVENOUS
  Administered 2023-10-20: .5 mg via INTRAVENOUS
  Administered 2023-10-20: 1 mg via INTRAVENOUS

## 2023-10-20 MED ORDER — HEPARIN SODIUM (PORCINE) 1000 UNIT/ML IJ SOLN
INTRAMUSCULAR | Status: DC | PRN
  Administered 2023-10-20: 3000 [IU] via INTRAVENOUS

## 2023-10-20 SURGICAL SUPPLY — 16 items
CANISTER PENUMBRA ENGINE (MISCELLANEOUS) IMPLANT
CATH ANGIO 5F PIGTAIL 100CM (CATHETERS) IMPLANT
CATH INDIGO 12XTORQ 100 (CATHETERS) IMPLANT
CATH INDIGO SEP 12 (CATHETERS) IMPLANT
CATH SELECT BERN TIP 5F 130 (CATHETERS) IMPLANT
CLOSURE PERCLOSE PROSTYLE (VASCULAR PRODUCTS) IMPLANT
COVER PROBE ULTRASOUND 5X96 (MISCELLANEOUS) IMPLANT
GLIDEWIRE ADV .035X260CM (WIRE) IMPLANT
INTRODUCER PERFORM 12FR X13 (SHEATH) IMPLANT
PACK ANGIOGRAPHY (CUSTOM PROCEDURE TRAY) ×1 IMPLANT
SHEATH BRITE TIP 6FRX11 (SHEATH) IMPLANT
SUT MNCRL AB 4-0 PS2 18 (SUTURE) IMPLANT
SYR MEDRAD MARK 7 150ML (SYRINGE) IMPLANT
TUBING CONTRAST HIGH PRESS 72 (TUBING) IMPLANT
WIRE J 3MM .035X145CM (WIRE) IMPLANT
WIRE SUPRACORE 300CM (WIRE) IMPLANT

## 2023-10-20 NOTE — Progress Notes (Signed)
 Dr. Vonna Guardian at bedside, speaking with pt. Re: vascular procedure. Pt. Verbalized understanding of conversation.

## 2023-10-20 NOTE — Plan of Care (Signed)

## 2023-10-20 NOTE — Op Note (Signed)
 [facility]  Percutaneous Study/Intervention Procedural Note   Date of Surgery: 10/20/2023,2:54 PM  Surgeon: Festus Barren  Pre-operative Diagnosis: Symptomatic bilateral pulmonary emboli  Post-operative diagnosis:  Same  Procedure(s) Performed:  1.  Contrast injection right heart  2.  Mechanical thrombectomy using the penumbra CAT 12 catheter to the left main and lower lobe pulmonary arteries in the right main, lower lobe, and middle lobe pulmonary arteries  3.  Selective catheter placement left lower lobe and upper lobe pulmonary arteries  4.  Selective catheter placement right lower lobe and middle lobe pulmonary arteries      Anesthesia: Conscious sedation was administered under my direct supervision by the interventional radiology RN. IV Versed plus fentanyl were utilized. Continuous ECG, pulse oximetry and blood pressure was monitored throughout the entire procedure.  Versed and fentanyl were administered intravenously.  Conscious sedation was administered for a total of 33 minutes using 2.5 mg of Versed and 125 mcg of Fentanyl.  EBL: 325 cc  Sheath: 12 Fr right femoral vein  Contrast: 45 cc   Fluoroscopy Time: 8.5 minutes  Indications:  Patient presents with pulmonary emboli. The patient is symptomatic with hypoxemia and dyspnea on exertion. The patient is otherwise a good candidate for intervention and even the long-term benefits pulmonary angiography with thrombolysis is offered. The risks and benefits are reviewed long-term benefits are discussed. All questions are answered patient agrees to proceed.  Procedure:  Misty Binney Schronceis a 73 y.o. female who was identified and appropriate procedural time out was performed.  The patient was then placed supine on the table and prepped and draped in the usual sterile fashion.  Ultrasound was used to evaluate the right common femoral vein.  It was patent, as it was echolucent and compressible.  A digital ultrasound  image was acquired for the permanent record.  A Seldinger needle was used to access the right common femoral vein under direct ultrasound guidance.  A 0.035 J wire was advanced without resistance and a 6Fr sheath was placed. A proglide device was placed in a preclose fashion and then upsized to a 12 [place] sheath.    The Advantage wire and pigtail catheter were then negotiated into the right atrium and bolus injection of contrast was utilized to demonstrate the right ventricle and the pulmonary artery outflow. The Advantage wire and catheter were then negotiated into the the pulmonary arteries.  The left pulmonary artery was cannulated and advanced into the left upper lobe and left lower lobe for selective imaging. This demonstrated a small amount of clot in the left main and lower lobe pulmonary artery.  I then transitioned to the right side with the advantage wire and catheter and first cannulated the right lower lobe and then the right middle and upper lobes.  This demonstrated extensive thrombosis of the right main pulmonary artery and right middle and lower lobe pulmonary arteries with essential occlusion secondary to thrombus.  3000 Units of heparin was then given and allowed to circulate.  The Penumbra Cat 12 catheter was then advanced up into the pulmonary vasculature. The right lung was addressed first.  Mechanical thrombectomy was performed with the penumbra CAT 12 catheter first in the right main pulmonary artery.  Then, catheter was negotiated into the right lower lobe pulmonary artery and mechanical thrombectomy was performed with the help of the separator. Follow-up imaging demonstrated a good result and therefore the catheter was renegotiated into the right middle lobe pulmonary artery and again mechanical thrombectomy was  performed. Passes were made with both the Penumbra catheter itself as well as introducing the separator. Follow-up imaging was then performed.  Marked improvement was seen  with essentially no thrombus seen in the right pulmonary arteries after thrombectomy.  The Penumbra Cat 12 catheter was then negotiated to the opposite side. The left lung was then addressed. Catheter was negotiated into the left main and then left lower lobe pulmonary arteries and mechanical thrombectomy was performed with the separator. Follow-up imaging demonstrated a good result and I elected to terminate the procedure.  After review these images wires were reintroduced and the catheter is removed. Then, the sheath is then pulled, the proglide device is secured, a Monocryl skin suture was placed and pressure is held. A sterile dressing is placed.    Findings:   Right heart imaging:  Right atrium and right ventricle and the pulmonary outflow tract appears fairly normal  Right lung:  This demonstrated extensive thrombosis of the right main pulmonary artery and right middle and lower lobe pulmonary arteries with essential occlusion secondary to thrombus.  Left lung:  This demonstrated a small amount of clot in the left main and lower lobe pulmonary artery.    Disposition: Patient was taken to the recovery room in stable condition having tolerated the procedure well.  Misty Hebert 10/20/2023,2:54 PM

## 2023-10-20 NOTE — Interval H&P Note (Signed)
 History and Physical Interval Note:  10/20/2023 12:26 PM  Misty Hebert  has presented today for surgery, with the diagnosis of Pulmonary Embolism.  The various methods of treatment have been discussed with the patient and family. After consideration of risks, benefits and other options for treatment, the patient has consented to  Procedure(s): PULMONARY THROMBECTOMY (Bilateral) as a surgical intervention.  The patient's history has been reviewed, patient examined, no change in status, stable for surgery.  I have reviewed the patient's chart and labs.  Questions were answered to the patient's satisfaction.     Villa Burgin

## 2023-10-20 NOTE — Progress Notes (Signed)
 Progress Note   Patient: Misty Hebert AVW:[id number] DOB: 10/07/1950 DOA: 10/18/2023     2 DOS: the patient was seen and examined on 10/20/2023   Brief hospital course: "Misty Hebert is a 73 y.o. female with medical history significant of DVT not on anticoagulants, HTN, HLD, DM, CAD, dCHF, morbid obesity, diverticulitis, s/p of colon resection, overactive bladder, migraine, who presents with chest pain.    Patient states that her chest pain started in the afternoon at 6:15 PM.  It is located in the front chest, constant, sharp, severe, radiating to the back.  Not aggravated or alleviated by any known factors.  Denies SOB, cough, fever or chills.  Patient has nausea, no vomiting, diarrhea or abdominal pain.  No symptoms of UTI.  No rectal bleeding or dark stool.  Patient states that she had 4 times of DVT in the past, currently not taking any anticoagulants.  Denies tenderness in the calf areas.   Data reviewed independently and ED Course: pt was found to have WBC 10.4, creatinine 1.09, BUN 16, GFR 54, troponin 15 --> 15.  Temperature normal, blood pressure 109/66, heart rate 80, oxygen saturation 96% on room air.  Chest x-ray negative.  Patient is admitted to PCU as inpatient.  Dr. Gilda Crease of [facility] is consulted."   I assumed care on 10/20/23. Vascular surgery took patient for pulmonary thrombectomy today. Patient remains on heparin drip.   Assessment and Plan:  Acute pulmonary embolism [facility]):  Presented for evaluation of chest pain and near syncope CTA showed  extensive pulmonary emboli worse on the right than the left without CT evidence of right heart strain.  Currently hemodynamically stable.  No hypoxia Right lower extremity DVT Lower extremity Doppler shows nonocclusive DVT in the right common femoral, popliteal, and calf veins. No evidence for DVT in the left lower extremity. --Vascular surgery following --s/p Pulmonary thrombectomy today --Plan is for RLE thrombectomy on  Friday --Continue IV heparin --Follow up pending Echo --Monitor oxygenation and hemodynamics closely --Telemetry monitoring   Essential hypertension: --Continue carvedilol --Holding HCTZ, Cozaar, oral hydralazine since patient is at risk of developing hypotension due to extensive PE.   CAD (coronary artery disease):  --Continue carvedilol, Zetia, sublingual nitroglycerin and pravastatin  Chronic heart failure with preserved ejection fraction (HFpEF) (HCC):  2D echo on 01/05/2017 showed EF> 55% with grade 1 diastolic dysfunction.   CHF is compensated. --Monitor volume status   Class III Obesity Body mass index is 40.46 kg/m. Complicates overall prognosis and care. Lifestyle modification and exercise has been discussed with patient in detail.      Subjective: Pt seen with husband and a friend at bedside this afternoon, after thrombectomy.  Pt reports feeling well, procedure went fine.  She was anxious beforehand. Denies chest pain or Sob.     Physical Exam: Vitals:   10/20/23 1455 10/20/23 1500 10/20/23 1515 10/20/23 1530  BP:  131/63 129/64 130/66  Pulse: (!) 0 71 67 69  Resp:  14 19 16   Temp:      TempSrc:      SpO2:  99% 100% 93%  Weight:      Height:       General exam: awake, alert, no acute distress HEENT: moist mucus membranes, hearing grossly normal  Respiratory system: CTAB, no wheezes, rales or rhonchi, normal respiratory effort. Cardiovascular system: normal S1/S2, RRR, no pedal edema.   Gastrointestinal system: soft, NT, ND, no HSM felt, +bowel sounds. Central nervous system: A&O x 3. no gross  focal neurologic deficits, normal speech Skin: dry, intact, normal temperature Psychiatry: normal mood, congruent affect, judgement and insight appear normal   Data Reviewed:  Notable labs --  CBG's at goal. Normal CBC  Family Communication: husband at bedside on rounds  Disposition: Status is: Inpatient Remains inpatient appropriate because: remains on IV  heparin with additional vascular procedure planned Friday   Planned Discharge Destination: Home    Time spent: 45 minutes  Author: Montey Apa, DO 10/20/2023 4:52 PM  For on call review [email or web address].

## 2023-10-20 NOTE — Progress Notes (Signed)
Patient off the unit for procedure at this time.  °

## 2023-10-20 NOTE — TOC CM/SW Note (Signed)
 Transition of Care [facility]) - Inpatient Brief Assessment   Patient Details  Name: Misty Hebert MRN: [id number] Date of Birth: April 11, 1951  Transition of Care [facility]) CM/SW Contact:    Odilia Bennett, LCSW Phone Number: 10/20/2023, 3:18 PM   Clinical Narrative: CSW reviewed chart. No TOC needs identified so far. CSW will continue to follow progress. Please place [facility] consult if any needs arise.  Transition of Care Asessment: Insurance and Status: Insurance coverage has been reviewed Patient has primary care physician: Yes Home environment has been reviewed: Single family home Prior level of function:: Not documented Prior/Current Home Services: No current home services Social Drivers of Health Review: SDOH reviewed no interventions necessary Readmission risk has been reviewed: Yes Transition of care needs: no transition of care needs at this time

## 2023-10-20 NOTE — Telephone Encounter (Signed)
 Patient Product/process development scientist completed.    The patient is insured through [employer]. Patient has Medicare and is not eligible for a copay card, but may be able to apply for patient assistance or Medicare RX Payment Plan (Patient Must reach out to their plan, if eligible for payment plan), if available.    Ran test claim for Eliquis 5 mg and the current 30 day co-pay is $47.00.  Ran test claim for Xarelto 20 mg and the current 30 day co-pay is $47.00.  This test claim was processed through [facility]- copay amounts may vary at other pharmacies due to pharmacy/plan contracts, or as the patient moves through the different stages of their insurance plan.     Roland Earl, CPHT Pharmacy Technician III Certified Patient Advocate [facility] Patient Advocate Team Direct Number: [phone]  Fax: [phone]

## 2023-10-20 NOTE — Progress Notes (Signed)
 ANTICOAGULATION CONSULT NOTE  Pharmacy Consult for heparin infusion Indication: pulmonary embolus  Allergies  Allergen Reactions   Chlorpromazine Anaphylaxis and Other (See Comments)    Other Reaction: LOC Other Reaction: LOC Other Reaction: LOC Other Reaction: LOC    Metoclopramide Other (See Comments) and Anaphylaxis    Other Reaction: RESTLESS Twitching , cant sit still  Other Reaction: RESTLESS Other Reaction: RESTLESS Other Reaction: RESTLESS    Sulfamethizole Nausea And Vomiting and Other (See Comments)   Amlodipine     BLLE edema   Other Other (See Comments)    Torazine gave her syncope Upset stomach Upset stomach    Celecoxib Nausea Only and Anxiety   Meloxicam Other (See Comments) and Diarrhea    Patient Measurements: Height: 5\' 3"  (160 cm) Weight: 106.2 kg (234 lb 2.1 oz) IBW/kg (Calculated) : 52.4 Heparin Dosing Weight: 77.7 kg  Vital Signs: Temp: 98.8 F (37.1 C) (04/15 2313) Temp Source: Oral (04/15 1658) BP: 129/65 (04/15 2313) Pulse Rate: 80 (04/15 2313)  Labs: Recent Labs    10/18/23 2003 10/18/23 2215 10/18/23 2219 10/19/23 0758 10/19/23 1710 10/20/23 0101  HGB 15.5*  --   --  14.2  --   --   HCT 44.8  --   --  40.7  --   --   PLT 182  --   --  164  --   --   APTT  --  22*  --   --   --   --   LABPROT  --  14.3  --   --   --   --   INR  --  1.1  --   --   --   --   HEPARINUNFRC  --   --   --  >1.10* 0.66 0.62  CREATININE 1.09*  --   --  1.21*  --   --   TROPONINIHS 15  --  15  --   --   --     Estimated Creatinine Clearance: 49 mL/min (A) (by C-G formula based on SCr of 1.21 mg/dL (H)).   Medical History: Past Medical History:  Diagnosis Date   Actinic keratosis    Arthritis of knee    Benign essential HTN    Chest pain    a. she reports 4 normal heart caths; b. 12/2013 St Echo: nl Ivette Marks); c. 04/2016 MV: apical and ant defect @ rest consistent w/ breast attenuation. No ischemia. EF 55-65%.   Depression    Diverticulitis  of intestine w/o perforation or abscess w/o bleeding    DVT (deep venous thrombosis) (HCC)    Dysphagia    Fibromyalgia    High cholesterol    History of colon resection    History of echocardiogram    a. 01/2017 Echo ([facility]): nl LV syst fxn.   Hyperlipidemia    Medial meniscus tear    Migraine    Neuromuscular disorder (HCC)    Obesity    Overactive bladder    Reflux    Subchondral insufficiency fracture of condyle of femur (HCC)    Tension headache     Assessment: Pt is a 73 yo female presenting to ED for CP, found with "extensive pulmonary emboli worse on the right than the left without evidence of right heart strain."  0415 0758 HL >1.10, SUPRAtherapeutic    0415 1710 HL 0.66, therapeutic x 1 0416 0101 HL 0.62, therapeutic x 2  Goal of Therapy:  Heparin level 0.3-0.7 units/ml Monitor platelets  by anticoagulation protocol: Yes   Plan:  Continue heparin infusion at 1100 units/hr Will check HL daily w/ AM labs while therapeutic CBC daily while on heparin  Coretta Dexter, PharmD, [facility] 10/20/2023 3:12 AM

## 2023-10-21 ENCOUNTER — Encounter
Admission: EM | Disposition: A | Discharge status: Home Health | Payer: Self-pay | Source: Home / Self Care | Attending: Internal Medicine

## 2023-10-21 ENCOUNTER — Encounter: Payer: Self-pay | Admitting: Vascular Surgery

## 2023-10-21 DIAGNOSIS — I82431 Acute embolism and thrombosis of right popliteal vein: Secondary | ICD-10-CM | POA: Diagnosis not present

## 2023-10-21 DIAGNOSIS — I82411 Acute embolism and thrombosis of right femoral vein: Secondary | ICD-10-CM

## 2023-10-21 DIAGNOSIS — I2699 Other pulmonary embolism without acute cor pulmonale: Secondary | ICD-10-CM | POA: Diagnosis not present

## 2023-10-21 HISTORY — PX: PERIPHERAL VASCULAR THROMBECTOMY: CATH118306

## 2023-10-21 LAB — HEPARIN LEVEL (UNFRACTIONATED): Heparin Unfractionated: 0.77 [IU]/mL — ABNORMAL HIGH (ref 0.30–0.70)

## 2023-10-21 LAB — CBC
HCT: 34.5 % — ABNORMAL LOW (ref 36.0–46.0)
Hemoglobin: 12.2 g/dL (ref 12.0–15.0)
MCH: 32.2 pg (ref 26.0–34.0)
MCHC: 35.4 g/dL (ref 30.0–36.0)
MCV: 91 fL (ref 80.0–100.0)
Platelets: 162 10*3/uL (ref 150–400)
RBC: 3.79 MIL/uL — ABNORMAL LOW (ref 3.87–5.11)
RDW: 13.9 % (ref 11.5–15.5)
WBC: 7.6 10*3/uL (ref 4.0–10.5)
nRBC: 0 % (ref 0.0–0.2)

## 2023-10-21 LAB — GLUCOSE, CAPILLARY
Glucose-Capillary: 121 mg/dL — ABNORMAL HIGH (ref 70–99)
Glucose-Capillary: 121 mg/dL — ABNORMAL HIGH (ref 70–99)
Glucose-Capillary: 117 mg/dL — ABNORMAL HIGH (ref 70–99)
Glucose-Capillary: 132 mg/dL — ABNORMAL HIGH (ref 70–99)

## 2023-10-21 LAB — BASIC METABOLIC PANEL WITH GFR
Anion gap: 7 (ref 5–15)
BUN: 14 mg/dL (ref 8–23)
CO2: 26 mmol/L (ref 22–32)
Calcium: 8.7 mg/dL — ABNORMAL LOW (ref 8.9–10.3)
Chloride: 104 mmol/L (ref 98–111)
Creatinine, Ser: 0.79 mg/dL (ref 0.44–1.00)
GFR, Estimated: >60 mL/min (ref >60)
Glucose, Bld: 139 mg/dL — ABNORMAL HIGH (ref 70–99)
Potassium: 3.8 mmol/L (ref 3.5–5.1)
Sodium: 137 mmol/L (ref 135–145)

## 2023-10-21 SURGERY — PERIPHERAL VASCULAR THROMBECTOMY
Anesthesia: Moderate Sedation | Laterality: Right

## 2023-10-21 MED ORDER — CEFAZOLIN SODIUM-DEXTROSE 2-4 GM/100ML-% IV SOLN
INTRAVENOUS | Status: AC
  Filled 2023-10-21: qty 100

## 2023-10-21 MED ORDER — SODIUM CHLORIDE 0.9 % IV SOLN: INTRAVENOUS | Status: DC

## 2023-10-21 MED ORDER — DIPHENHYDRAMINE HCL 50 MG/ML IJ SOLN: 50.0000 mg | Freq: Once | INTRAMUSCULAR | Status: DC | PRN

## 2023-10-21 MED ORDER — FENTANYL CITRATE (PF) 100 MCG/2ML IJ SOLN
INTRAMUSCULAR | Status: DC | PRN
  Administered 2023-10-21: 50 ug via INTRAVENOUS

## 2023-10-21 MED ORDER — MIDAZOLAM HCL 5 MG/5ML IJ SOLN
INTRAMUSCULAR | Status: AC
  Filled 2023-10-21: qty 5

## 2023-10-21 MED ORDER — LIDOCAINE-EPINEPHRINE (PF) 1 %-1:200000 IJ SOLN
INTRAMUSCULAR | Status: DC | PRN
  Administered 2023-10-21: 10 mL

## 2023-10-21 MED ORDER — IODIXANOL 320 MG/ML IV SOLN
INTRAVENOUS | Status: DC | PRN
  Administered 2023-10-21: 30 mL

## 2023-10-21 MED ORDER — FENTANYL CITRATE PF 50 MCG/ML IJ SOSY: 12.5000 ug | PREFILLED_SYRINGE | Freq: Once | INTRAMUSCULAR | Status: DC | PRN

## 2023-10-21 MED ORDER — HEPARIN (PORCINE) IN NACL 1000-0.9 UT/500ML-% IV SOLN
INTRAVENOUS | Status: DC | PRN
  Administered 2023-10-21: 1000 mL

## 2023-10-21 MED ORDER — FENTANYL CITRATE (PF) 100 MCG/2ML IJ SOLN
INTRAMUSCULAR | Status: AC
  Filled 2023-10-21: qty 2

## 2023-10-21 MED ORDER — MIDAZOLAM HCL 2 MG/2ML IJ SOLN
INTRAMUSCULAR | Status: DC | PRN
  Administered 2023-10-21: 2 mg via INTRAVENOUS

## 2023-10-21 MED ORDER — HYDROMORPHONE HCL 1 MG/ML IJ SOLN
INTRAMUSCULAR | Status: AC
  Filled 2023-10-21: qty 1

## 2023-10-21 MED ORDER — MIDAZOLAM HCL 2 MG/ML PO SYRP: 8.0000 mg | ORAL_SOLUTION | Freq: Once | ORAL | Status: DC | PRN

## 2023-10-21 MED ORDER — FAMOTIDINE 20 MG PO TABS: 40.0000 mg | ORAL_TABLET | Freq: Once | ORAL | Status: DC | PRN

## 2023-10-21 MED ORDER — HEPARIN SODIUM (PORCINE) 1000 UNIT/ML IJ SOLN
INTRAMUSCULAR | Status: DC | PRN
  Administered 2023-10-21: 3000 [IU] via INTRAVENOUS

## 2023-10-21 MED ORDER — CEFAZOLIN SODIUM-DEXTROSE 2-4 GM/100ML-% IV SOLN
2.0000 g | INTRAVENOUS | Status: AC
  Administered 2023-10-21: 2 g via INTRAVENOUS
  Filled 2023-10-21: qty 100

## 2023-10-21 MED ORDER — METHYLPREDNISOLONE SODIUM SUCC 125 MG IJ SOLR: 125.0000 mg | Freq: Once | INTRAMUSCULAR | Status: DC | PRN

## 2023-10-21 MED ORDER — HEPARIN SODIUM (PORCINE) 1000 UNIT/ML IJ SOLN
INTRAMUSCULAR | Status: AC
  Filled 2023-10-21: qty 10

## 2023-10-21 SURGICAL SUPPLY — 9 items
CANISTER PENUMBRA ENGINE (MISCELLANEOUS) IMPLANT
CATH LIGHTNI FLASH 16XTORQ 100 (CATHETERS) IMPLANT
CLOSURE PERCLOSE PROSTYLE (VASCULAR PRODUCTS) IMPLANT
COVER PROBE ULTRASOUND 5X96 (MISCELLANEOUS) IMPLANT
KIT MICROPUNCTURE VSI 5F STIFF (SHEATH) IMPLANT
PACK ANGIOGRAPHY (CUSTOM PROCEDURE TRAY) ×2 IMPLANT
SHEATH BRITE TIP 6FRX11 (SHEATH) IMPLANT
SHEATH INTRO CHECKFLO 16F 13 (SHEATH) IMPLANT
WIRE J 3MM .035X145CM (WIRE) IMPLANT

## 2023-10-21 NOTE — Plan of Care (Signed)
  Problem: Education: Goal: Ability to describe self-care measures that may prevent or decrease complications (Diabetes Survival Skills Education) will improve Outcome: Progressing   Problem: Coping: Goal: Ability to adjust to condition or change in health will improve Outcome: Progressing   Problem: Metabolic: Goal: Ability to maintain appropriate glucose levels will improve Outcome: Progressing

## 2023-10-21 NOTE — Progress Notes (Addendum)
 Progress Note    10/21/2023 7:37 AM 1 Day Post-Op  Subjective:  Misty Hebert is a 73 year old female now postop day 1 from a pulmonary thrombectomy due to pulmonary embolism.  Patient is resting comfortably in bed this morning.  Patient is not requiring any nasal cannula oxygen.  Patient endorses she is breathing much better feels better.  She denies having any chest pain.  Patient has not been up to ambulate yet this morning.  Patient's right groin is clean dry and intact without any problems.  She does endorse some soreness to that area but it is tolerable.  No complaints overnight vitals are remained stable.  Review of Systems  Constitutional:  Constitutional negative. Respiratory: Respiratory negative.  Cardiovascular: Cardiovascular negative.  GI: Gastrointestinal negative.  GU: Genitourinary negative. Musculoskeletal: Musculoskeletal negative.  Neurological: Neurological negative. Psychiatric: Psychiatric negative.    Vitals:   10/21/23 0002 10/21/23 0559  BP: 119/63 125/69  Pulse: 72 74  Resp: 18 18  Temp: 98.2 F (36.8 C) 98.5 F (36.9 C)  SpO2: 93% 95%   Physical Exam: Cardiac:  RRR, normal S1 and S2, no rubs clicks gallops or murmurs to note. Lungs: Normal labored breathing, they are on auscultation but diminished throughout the right lower and right middle lobe.  No rales rhonchi or wheezing on the left. Incisions: Right groin incision with dressing clean dry and intact no hematoma seroma to note. Extremities: All extremities with palpable pulses.  All extremities warm to touch.  Right lower extremity with +1 edema compared to the left due to extensive DVT. Abdomen: Morbidly obese, positive bowel sounds throughout, soft, nontender and nondistended. Neurologic: And oriented x 3, completely neuro intact.  CBC    Component Value Date/Time   WBC 7.6 10/21/2023 0405   RBC 3.79 (L) 10/21/2023 0405   HGB 12.2 10/21/2023 0405   HGB 16.2 (H) 09/09/2020 1513   HCT  34.5 (L) 10/21/2023 0405   HCT 47.7 (H) 09/09/2020 1513   PLT 162 10/21/2023 0405   PLT 188 09/09/2020 1513   MCV 91.0 10/21/2023 0405   MCV 93 09/09/2020 1513   MCV 93 10/26/2014 2240   MCH 32.2 10/21/2023 0405   MCHC 35.4 10/21/2023 0405   RDW 13.9 10/21/2023 0405   RDW 12.6 09/09/2020 1513   RDW 13.5 10/26/2014 2240   LYMPHSABS 2.5 10/26/2014 2240   MONOABS 0.7 10/26/2014 2240   EOSABS 0.4 10/26/2014 2240   BASOSABS 0.1 10/26/2014 2240    BMET    Component Value Date/Time   NA 137 10/21/2023 0405   NA 141 05/07/2021 1018   NA 137 10/26/2014 2240   K 3.8 10/21/2023 0405   K 4.0 10/26/2014 2240   CL 104 10/21/2023 0405   CL 102 10/26/2014 2240   CO2 26 10/21/2023 0405   CO2 25 10/26/2014 2240   GLUCOSE 139 (H) 10/21/2023 0405   GLUCOSE 174 (H) 10/26/2014 2240   BUN 14 10/21/2023 0405   BUN 11 05/07/2021 1018   BUN 13 10/26/2014 2240   CREATININE 0.79 10/21/2023 0405   CREATININE 1.02 (H) 10/26/2014 2240   CALCIUM 8.7 (L) 10/21/2023 0405   CALCIUM 9.1 10/26/2014 2240   GFRNONAA >60 10/21/2023 0405   GFRNONAA 58 (L) 10/26/2014 2240   GFRAA >60 12/27/2019 1210   GFRAA >60 10/26/2014 2240    INR    Component Value Date/Time   INR 1.1 10/18/2023 2215   INR 1.1 07/14/2012 0505     Intake/Output Summary (Last 24 hours) at  10/21/2023 0737 Last data filed at 10/21/2023 0025 Gross per 24 hour  Intake 60 ml  Output 875 ml  Net -815 ml     Assessment/Plan:  73 y.o. female is s/p pulmonary thrombectomy 1 Day Post-Op   PLAN Continue heparin infusion today. Vascular surgery plans on taking the patient to the vascular lab today for right lower extremity thrombectomy for an extensive DVT.  I had a long detailed conversation this morning at the bedside with the patient regarding the procedure, benefits, risk, and complications.  Patient verbalized understanding and wishes to proceed.  I answered all the patient's questions this morning.  Patient has been n.p.o. since  midnight last night.  Patient remains on a heparin infusion prior to the procedure.  I discussed the case in detail with Dr. Mikki Alexander MD and he agrees with the plan.   DVT prophylaxis: Heparin infusion.   Annamaria Barrette [facility] 10/21/2023 7:37 AM

## 2023-10-21 NOTE — Plan of Care (Signed)

## 2023-10-21 NOTE — Progress Notes (Signed)
Patient off the unit at this time for procedure.

## 2023-10-21 NOTE — H&P (View-Only) (Signed)
 Progress Note    10/21/2023 7:37 AM 1 Day Post-Op  Subjective:  Misty Hebert is a 73 year old female now postop day 1 from a pulmonary thrombectomy due to pulmonary embolism.  Patient is resting comfortably in bed this morning.  Patient is not requiring any nasal cannula oxygen.  Patient endorses she is breathing much better feels better.  She denies having any chest pain.  Patient has not been up to ambulate yet this morning.  Patient's right groin is clean dry and intact without any problems.  She does endorse some soreness to that area but it is tolerable.  No complaints overnight vitals are remained stable.  Review of Systems  Constitutional:  Constitutional negative. Respiratory: Respiratory negative.  Cardiovascular: Cardiovascular negative.  GI: Gastrointestinal negative.  GU: Genitourinary negative. Musculoskeletal: Musculoskeletal negative.  Neurological: Neurological negative. Psychiatric: Psychiatric negative.    Vitals:   10/21/23 0002 10/21/23 0559  BP: 119/63 125/69  Pulse: 72 74  Resp: 18 18  Temp: 98.2 F (36.8 C) 98.5 F (36.9 C)  SpO2: 93% 95%   Physical Exam: Cardiac:  RRR, normal S1 and S2, no rubs clicks gallops or murmurs to note. Lungs: Normal labored breathing, they are on auscultation but diminished throughout the right lower and right middle lobe.  No rales rhonchi or wheezing on the left. Incisions: Right groin incision with dressing clean dry and intact no hematoma seroma to note. Extremities: All extremities with palpable pulses.  All extremities warm to touch.  Right lower extremity with +1 edema compared to the left due to extensive DVT. Abdomen: Morbidly obese, positive bowel sounds throughout, soft, nontender and nondistended. Neurologic: And oriented x 3, completely neuro intact.  CBC    Component Value Date/Time   WBC 7.6 10/21/2023 0405   RBC 3.79 (L) 10/21/2023 0405   HGB 12.2 10/21/2023 0405   HGB 16.2 (H) 09/09/2020 1513   HCT  34.5 (L) 10/21/2023 0405   HCT 47.7 (H) 09/09/2020 1513   PLT 162 10/21/2023 0405   PLT 188 09/09/2020 1513   MCV 91.0 10/21/2023 0405   MCV 93 09/09/2020 1513   MCV 93 10/26/2014 2240   MCH 32.2 10/21/2023 0405   MCHC 35.4 10/21/2023 0405   RDW 13.9 10/21/2023 0405   RDW 12.6 09/09/2020 1513   RDW 13.5 10/26/2014 2240   LYMPHSABS 2.5 10/26/2014 2240   MONOABS 0.7 10/26/2014 2240   EOSABS 0.4 10/26/2014 2240   BASOSABS 0.1 10/26/2014 2240    BMET    Component Value Date/Time   NA 137 10/21/2023 0405   NA 141 05/07/2021 1018   NA 137 10/26/2014 2240   K 3.8 10/21/2023 0405   K 4.0 10/26/2014 2240   CL 104 10/21/2023 0405   CL 102 10/26/2014 2240   CO2 26 10/21/2023 0405   CO2 25 10/26/2014 2240   GLUCOSE 139 (H) 10/21/2023 0405   GLUCOSE 174 (H) 10/26/2014 2240   BUN 14 10/21/2023 0405   BUN 11 05/07/2021 1018   BUN 13 10/26/2014 2240   CREATININE 0.79 10/21/2023 0405   CREATININE 1.02 (H) 10/26/2014 2240   CALCIUM 8.7 (L) 10/21/2023 0405   CALCIUM 9.1 10/26/2014 2240   GFRNONAA >60 10/21/2023 0405   GFRNONAA 58 (L) 10/26/2014 2240   GFRAA >60 12/27/2019 1210   GFRAA >60 10/26/2014 2240    INR    Component Value Date/Time   INR 1.1 10/18/2023 2215   INR 1.1 07/14/2012 0505     Intake/Output Summary (Last 24 hours) at  10/21/2023 0737 Last data filed at 10/21/2023 0025 Gross per 24 hour  Intake 60 ml  Output 875 ml  Net -815 ml     Assessment/Plan:  73 y.o. female is s/p pulmonary thrombectomy 1 Day Post-Op   PLAN Continue heparin infusion today. Vascular surgery plans on taking the patient to the vascular lab today for right lower extremity thrombectomy for an extensive DVT.  I had a long detailed conversation this morning at the bedside with the patient regarding the procedure, benefits, risk, and complications.  Patient verbalized understanding and wishes to proceed.  I answered all the patient's questions this morning.  Patient has been n.p.o. since  midnight last night.  Patient remains on a heparin infusion prior to the procedure.  I discussed the case in detail with Dr. Festus Barren MD and he agrees with the plan.   DVT prophylaxis: Heparin infusion.   Marcie Bal [facility] 10/21/2023 7:37 AM

## 2023-10-21 NOTE — Interval H&P Note (Signed)
 History and Physical Interval Note:  10/21/2023 12:51 PM  Misty Hebert  has presented today for surgery, with the diagnosis of Deep Vein Thrombosis.  The various methods of treatment have been discussed with the patient and family. After consideration of risks, benefits and other options for treatment, the patient has consented to  Procedure(s): PERIPHERAL VASCULAR THROMBECTOMY (Right) as a surgical intervention.  The patient's history has been reviewed, patient examined, no change in status, stable for surgery.  I have reviewed the patient's chart and labs.  Questions were answered to the patient's satisfaction.     Alga Southall

## 2023-10-21 NOTE — Progress Notes (Signed)
 Progress Note   Patient: Misty Hebert:[id number] DOB: 05-24-1951 DOA: 10/18/2023     3 DOS: the patient was seen and examined on 10/21/2023   Brief hospital course: "NAVEH RICKLES is a 73 y.o. female with medical history significant of DVT not on anticoagulants, HTN, HLD, DM, CAD, dCHF, morbid obesity, diverticulitis, s/p of colon resection, overactive bladder, migraine, who presents with chest pain.    Patient states that her chest pain started in the afternoon at 6:15 PM.  It is located in the front chest, constant, sharp, severe, radiating to the back.  Not aggravated or alleviated by any known factors.  Denies SOB, cough, fever or chills.  Patient has nausea, no vomiting, diarrhea or abdominal pain.  No symptoms of UTI.  No rectal bleeding or dark stool.  Patient states that she had 4 times of DVT in the past, currently not taking any anticoagulants.  Denies tenderness in the calf areas.   Data reviewed independently and ED Course: pt was found to have WBC 10.4, creatinine 1.09, BUN 16, GFR 54, troponin 15 --> 15.  Temperature normal, blood pressure 109/66, heart rate 80, oxygen saturation 96% on room air.  Chest x-ray negative.  Patient is admitted to PCU as inpatient.  Dr. Prescilla Brod of [facility] is consulted."   I assumed care on 10/20/23. Vascular surgery took patient for pulmonary thrombectomy today. Patient remains on heparin drip.   Assessment and Plan:  Acute pulmonary embolism [facility]):  Presented for evaluation of chest pain and near syncope CTA showed  extensive pulmonary emboli worse on the right than the left without CT evidence of right heart strain.  Currently hemodynamically stable.  No hypoxia Right lower extremity DVT Lower extremity Doppler shows nonocclusive DVT in the right common femoral, popliteal, and calf veins. No evidence for DVT in the left lower extremity. --Vascular surgery following --s/p Pulmonary thrombectomy 4/16 --Plan is for RLE thrombectomy  TODAY --Continue IV heparin --Monitor oxygenation and hemodynamics closely --Telemetry monitoring   Essential hypertension: --Continue carvedilol --Holding HCTZ, Cozaar, oral hydralazine since patient is at risk of developing hypotension due to extensive PE.   CAD (coronary artery disease):  --Continue carvedilol, Zetia, sublingual nitroglycerin and pravastatin  Chronic heart failure with preserved ejection fraction (HFpEF) (HCC):  2D echo on 01/05/2017 showed EF> 55% with grade 1 diastolic dysfunction.   CHF is compensated. --Monitor volume status   Class III Obesity Body mass index is 40.38 kg/m. Complicates overall prognosis and care. Lifestyle modification and exercise has been discussed with patient in detail.      Subjective: Pt seen in SR stable. Husband at bedside on AM rounds. No acute events reported   Physical Exam: Vitals:   10/21/23 1540 10/21/23 1544 10/21/23 1600 10/21/23 1610  BP: (!) 138/107 (!) 121/56 (!) 130/59 (!) 132/55  Pulse: 74 72 73 70  Resp: 12 15 (!) 25 12  Temp: 98.1 F (36.7 C)     TempSrc: Oral     SpO2: 94% 95% 90% 97%  Weight:      Height:       General exam: awake, alert, no acute distress HEENT: moist mucus membranes, hearing grossly normal  Respiratory system: CTAB, no wheezes, rales or rhonchi, normal respiratory effort. Cardiovascular system: normal S1/S2, RRR, no pedal edema.   Gastrointestinal system: soft, NT, ND, no HSM felt, +bowel sounds. Central nervous system: A&O x 3. no gross focal neurologic deficits, normal speech Skin: dry, intact, normal temperature Psychiatry: normal mood, congruent affect, judgement  and insight appear normal   Data Reviewed:  Notable labs --  CBG's at goal. Normal CBC  Family Communication: husband at bedside on rounds  Disposition: Status is: Inpatient Remains inpatient appropriate because: remains on IV heparin with additional vascular procedure today   Planned Discharge Destination:  Home    Time spent: 35 minutes  Author: Montey Apa, DO 10/21/2023 4:15 PM  For on call review [email or web address].

## 2023-10-21 NOTE — Progress Notes (Signed)
 ANTICOAGULATION CONSULT NOTE  Pharmacy Consult for heparin infusion Indication: pulmonary embolus  Allergies  Allergen Reactions   Chlorpromazine Anaphylaxis and Other (See Comments)    Other Reaction: LOC Other Reaction: LOC Other Reaction: LOC Other Reaction: LOC    Metoclopramide Other (See Comments) and Anaphylaxis    Other Reaction: RESTLESS Twitching , cant sit still  Other Reaction: RESTLESS Other Reaction: RESTLESS Other Reaction: RESTLESS    Sulfamethizole Nausea And Vomiting and Other (See Comments)   Amlodipine     BLLE edema   Other Other (See Comments)    Torazine gave her syncope Upset stomach Upset stomach    Celecoxib Nausea Only and Anxiety   Meloxicam Other (See Comments) and Diarrhea    Patient Measurements: Height: 5\' 3"  (160 cm) Weight: 103.6 kg (228 lb 6.3 oz) IBW/kg (Calculated) : 52.4 Heparin Dosing Weight: 77.7 kg  Vital Signs: Temp: 98.2 F (36.8 C) (04/17 0002) BP: 119/63 (04/17 0002) Pulse Rate: 72 (04/17 0002)  Labs: Recent Labs    10/18/23 2003 10/18/23 2003 10/18/23 2215 10/18/23 2219 10/19/23 0758 10/19/23 1710 10/20/23 0101 10/20/23 0234 10/21/23 0405  HGB 15.5*  --   --   --  14.2  --   --  13.4 12.2  HCT 44.8  --   --   --  40.7  --   --  38.9 34.5*  PLT 182  --   --   --  164  --   --  162 162  APTT  --   --  22*  --   --   --   --   --   --   LABPROT  --   --  14.3  --   --   --   --   --   --   INR  --   --  1.1  --   --   --   --   --   --   HEPARINUNFRC  --    < >  --   --  >1.10* 0.66 0.62  --  0.77*  CREATININE 1.09*  --   --   --  1.21*  --   --   --  0.79  TROPONINIHS 15  --   --  15  --   --   --   --   --    < > = values in this interval not displayed.    Estimated Creatinine Clearance: 73.2 mL/min (by C-G formula based on SCr of 0.79 mg/dL).   Medical History: Past Medical History:  Diagnosis Date   Actinic keratosis    Arthritis of knee    Benign essential HTN    Chest pain    a. she reports  4 normal heart caths; b. 12/2013 St Echo: nl Ivette Marks); c. 04/2016 MV: apical and ant defect @ rest consistent w/ breast attenuation. No ischemia. EF 55-65%.   Depression    Diverticulitis of intestine w/o perforation or abscess w/o bleeding    DVT (deep venous thrombosis) (HCC)    Dysphagia    Fibromyalgia    High cholesterol    History of colon resection    History of echocardiogram    a. 01/2017 Echo ([facility]): nl LV syst fxn.   Hyperlipidemia    Medial meniscus tear    Migraine    Neuromuscular disorder (HCC)    Obesity    Overactive bladder    Reflux    Subchondral insufficiency fracture of condyle  of femur (HCC)    Tension headache     Assessment: Pt is a 73 yo female presenting to ED for CP, found with "extensive pulmonary emboli worse on the right than the left without evidence of right heart strain."  0415 0758 HL >1.10, SUPRAtherapeutic    0415 1710 HL 0.66, therapeutic x 1 0416 0101 HL 0.62, therapeutic x 2 0417 0405 HL 0.77, supratherapeutic  Goal of Therapy:  Heparin level 0.3-0.7 units/ml Monitor platelets by anticoagulation protocol: Yes   Plan:  Decrease heparin infusion to 1000 units/hr Recheck HL in 8 hrs after rate change CBC daily while on heparin  Coretta Dexter, PharmD, [facility] 10/21/2023 5:19 AM

## 2023-10-21 NOTE — Op Note (Signed)
 [facility]   OPERATIVE NOTE   PRE-OPERATIVE DIAGNOSIS: extensive RLE DVT  POST-OPERATIVE DIAGNOSIS: same   PROCEDURE: 1.   US guidance for vascular access to right popliteal vein 2.   Catheter placement into right iliac vein from right popliteal approach 3.   IVC gram and right lower extremity venogram 4.   Mechanical thrombectomy to to the right popliteal vein, superficial femoral vein, and common femoral vein with the penumbra 16 flash catheter     SURGEON: Festus Barren, MD  ASSISTANT(S): none  ANESTHESIA: local with moderate conscious sedation for 19 minutes using 2 mg of Versed and 50 mcg of Fentanyl  ESTIMATED BLOOD LOSS: 200 cc  FINDING(S): 1.  Thrombosis in the right popliteal vein, superficial femoral vein, and up to the common femoral vein.  The iliac veins and IVC were widely patent without thrombosis.  No significant stenosis was identified.  SPECIMEN(S):  none  INDICATIONS:    Patient is a 73 y.o. female who presents with extensive DVT and pulmonary embolus.  She has already undergone pulmonary thrombectomy with good results.  Patient has marked leg swelling and pain.  Venous intervention is performed to reduce the symtpoms and avoid long term postphlebitic symptoms.    DESCRIPTION: After obtaining full informed written consent, the patient was brought back to the vascular suite and placed supine upon the table. Moderate conscious sedation was administered during a face to face encounter with the patient throughout the procedure with my supervision of the RN administering medicines and monitoring the patient's vital signs, pulse oximetry, telemetry and mental status throughout from the start of the procedure until the patient was taken to the recovery room.  After obtaining adequate anesthesia, the patient was prepped and draped in the standard fashion.    The patient was then placed into the prone position.  The right popliteal vein was then  accessed under direct ultrasound guidance without difficulty with a micropuncture needle and a permanent image was recorded.  A micropuncture wire and sheath were then placed.  I upsized to a 6 [place] sheath and placed a J-wire and then placed a ProGlide device in a preclose fashion.  I then upsized to an 16 Fr sheath over a J wire.  3000 units of heparin were then given.  Imaging was then performed.  A Kumpe catheter and Magic tourque wire were then advanced into the CFV and images were performed.  Thrombosis in the right popliteal vein, superficial femoral vein, and up to the common femoral vein.  The iliac veins and IVC were widely patent without thrombosis.  No significant stenosis was identified.  I was able to cross the thrombus and stenosis and advance into the IVC which was patent.  At this point, I used the Penumbra Cat 16 catheter and evacuated about 200 cc of effluent with mechanical thrombectomy throughout the CFV, SFV, and popliteal vein.  This had marked improvement with minimal residual thrombus seen down to the access site.  I then elected to terminate the procedure.  The sheath, the Pro-glide device was secured, was removed and a dressing was placed.  She was taken to the recovery room in stable condition having tolerated the procedure well.    COMPLICATIONS: None  CONDITION: Stable  Festus Barren 10/21/2023 3:19 PM

## 2023-10-21 NOTE — Progress Notes (Signed)
 ANTICOAGULATION CONSULT NOTE  Pharmacy Consult for heparin infusion Indication: pulmonary embolus  Allergies  Allergen Reactions   Chlorpromazine Anaphylaxis and Other (See Comments)    Other Reaction: LOC Other Reaction: LOC Other Reaction: LOC Other Reaction: LOC    Metoclopramide Other (See Comments) and Anaphylaxis    Other Reaction: RESTLESS Twitching , cant sit still  Other Reaction: RESTLESS Other Reaction: RESTLESS Other Reaction: RESTLESS    Sulfamethizole Nausea And Vomiting and Other (See Comments)   Amlodipine     BLLE edema   Other Other (See Comments)    Torazine gave her syncope Upset stomach Upset stomach    Celecoxib Nausea Only and Anxiety   Meloxicam Other (See Comments) and Diarrhea   Patient Measurements: Height: 5\' 3"  (160 cm) Weight: 103.4 kg (227 lb 15.3 oz) IBW/kg (Calculated) : 52.4 Heparin Dosing Weight: 77.7 kg  Vital Signs: Temp: 98.1 F (36.7 C) (04/17 1640) Temp Source: Oral (04/17 1640) BP: 146/56 (04/17 1640) Pulse Rate: 71 (04/17 1640)  Labs: Recent Labs    10/18/23 2003 10/18/23 2003 10/18/23 2215 10/18/23 2219 10/19/23 0758 10/19/23 1710 10/20/23 0101 10/20/23 0234 10/21/23 0405  HGB 15.5*  --   --   --  14.2  --   --  13.4 12.2  HCT 44.8  --   --   --  40.7  --   --  38.9 34.5*  PLT 182  --   --   --  164  --   --  162 162  APTT  --   --  22*  --   --   --   --   --   --   LABPROT  --   --  14.3  --   --   --   --   --   --   INR  --   --  1.1  --   --   --   --   --   --   HEPARINUNFRC  --    < >  --   --  >1.10* 0.66 0.62  --  0.77*  CREATININE 1.09*  --   --   --  1.21*  --   --   --  0.79  TROPONINIHS 15  --   --  15  --   --   --   --   --    < > = values in this interval not displayed.   Estimated Creatinine Clearance: 73.1 mL/min (by C-G formula based on SCr of 0.79 mg/dL).  Medical History: Past Medical History:  Diagnosis Date   Actinic keratosis    Arthritis of knee    Benign essential HTN     Chest pain    a. she reports 4 normal heart caths; b. 12/2013 St Echo: nl Gavin Potters); c. 04/2016 MV: apical and ant defect @ rest consistent w/ breast attenuation. No ischemia. EF 55-65%.   Depression    Diverticulitis of intestine w/o perforation or abscess w/o bleeding    DVT (deep venous thrombosis) (HCC)    Dysphagia    Fibromyalgia    High cholesterol    History of colon resection    History of echocardiogram    a. 01/2017 Echo ([facility]): nl LV syst fxn.   Hyperlipidemia    Medial meniscus tear    Migraine    Neuromuscular disorder (HCC)    Obesity    Overactive bladder    Reflux    Subchondral insufficiency fracture  of condyle of femur (HCC)    Tension headache    Assessment: Pt is a 73 yo female presenting to ED for CP, found with "extensive pulmonary emboli worse on the right than the left without evidence of right heart strain."  0415 0758 HL >1.10, SUPRAtherapeutic    0415 1710 HL 0.66, therapeutic x 1 0416 0101 HL 0.62, therapeutic x 2 0417 0405 HL 0.77, supratherapeutic  Goal of Therapy:  Heparin level 0.3-0.7 units/ml Monitor platelets by anticoagulation protocol: Yes   Plan:  Heparin infusion to resume at previous rate of 1000 units/hr per MD Resumed at 1605  Will recheck HL 8 hours after resumption Monitor CBC daily while on heparin    Braya Habermehl, PharmD Pharmacy Resident  10/21/2023 5:39 PM

## 2023-10-22 ENCOUNTER — Other Ambulatory Visit: Payer: Self-pay

## 2023-10-22 ENCOUNTER — Encounter: Payer: Self-pay | Admitting: Vascular Surgery

## 2023-10-22 DIAGNOSIS — I82411 Acute embolism and thrombosis of right femoral vein: Secondary | ICD-10-CM | POA: Diagnosis not present

## 2023-10-22 DIAGNOSIS — I2699 Other pulmonary embolism without acute cor pulmonale: Secondary | ICD-10-CM | POA: Diagnosis not present

## 2023-10-22 LAB — HEPARIN LEVEL (UNFRACTIONATED)
Heparin Unfractionated: 0.29 [IU]/mL — ABNORMAL LOW (ref 0.30–0.70)
Heparin Unfractionated: 0.38 [IU]/mL (ref 0.30–0.70)

## 2023-10-22 LAB — GLUCOSE, CAPILLARY
Glucose-Capillary: 108 mg/dL — ABNORMAL HIGH (ref 70–99)
Glucose-Capillary: 130 mg/dL — ABNORMAL HIGH (ref 70–99)

## 2023-10-22 LAB — BASIC METABOLIC PANEL WITH GFR
Anion gap: 9 (ref 5–15)
BUN: 13 mg/dL (ref 8–23)
CO2: 22 mmol/L (ref 22–32)
Calcium: 8.5 mg/dL — ABNORMAL LOW (ref 8.9–10.3)
Chloride: 102 mmol/L (ref 98–111)
Creatinine, Ser: 0.9 mg/dL (ref 0.44–1.00)
GFR, Estimated: >60 mL/min (ref >60)
Glucose, Bld: 129 mg/dL — ABNORMAL HIGH (ref 70–99)
Potassium: 3.9 mmol/L (ref 3.5–5.1)
Sodium: 133 mmol/L — ABNORMAL LOW (ref 135–145)

## 2023-10-22 LAB — CBC
HCT: 33.4 % — ABNORMAL LOW (ref 36.0–46.0)
Hemoglobin: 11.8 g/dL — ABNORMAL LOW (ref 12.0–15.0)
MCH: 32.7 pg (ref 26.0–34.0)
MCHC: 35.3 g/dL (ref 30.0–36.0)
MCV: 92.5 fL (ref 80.0–100.0)
Platelets: 155 10*3/uL (ref 150–400)
RBC: 3.61 MIL/uL — ABNORMAL LOW (ref 3.87–5.11)
RDW: 13.3 % (ref 11.5–15.5)
WBC: 7.7 10*3/uL (ref 4.0–10.5)
nRBC: 0 % (ref 0.0–0.2)

## 2023-10-22 LAB — MAGNESIUM: Magnesium: 1.7 mg/dL (ref 1.7–2.4)

## 2023-10-22 MED ORDER — APIXABAN 5 MG PO TABS
10.0000 mg | ORAL_TABLET | Freq: Two times a day (BID) | ORAL | Status: DC
  Administered 2023-10-22: 10 mg via ORAL
  Filled 2023-10-22: qty 2

## 2023-10-22 MED ORDER — APIXABAN (ELIQUIS) VTE STARTER PACK (10MG AND 5MG)
ORAL_TABLET | ORAL | 0 refills | Status: DC
  Filled 2023-10-22: qty 74, 30d supply, fill #0

## 2023-10-22 MED ORDER — APIXABAN 5 MG PO TABS: 5.0000 mg | ORAL_TABLET | Freq: Two times a day (BID) | ORAL | Status: DC

## 2023-10-22 NOTE — Plan of Care (Signed)

## 2023-10-22 NOTE — Evaluation (Signed)
 Physical Therapy Evaluation Patient Details Name: Misty Hebert MRN: [id number] DOB: 08/27/1950 Today's Date: 10/22/2023  History of Present Illness  Misty Hebert is a 73 year old female now postop day  #2 from a pulmonary thrombectomy due to pulmonary embolism, post op day #1 from right lower extremity Mechanical Thrombectomy to the right popliteal vein, superficial femoral vein and common femoral vein.   Clinical Impression  Pt admitted with above diagnosis. Pt currently with functional limitations due to the deficits listed below (see PT Problem List). Pt received upright in bed agreeable to PT services. Reports PTA being fully independent.   To date pt performs all functional mobility at supervision level. Does appear unsteady in standing and ambulating 20' to bathroom furniture cruising with short, reciprocal steps. Able to urinate and perform pericare and hand hygiene independently. Did rely on CGA to stand from lowered toilet. Pt provided rollator completing 180' of gait with improved step through gait and x1 seated rest break. Good understanding of brakes throughout. Pt does return to room in recliner with HR and SPO2 WNL. Provided education on energy conservation techniques and benefits of RW compared to rollator. Pt understanding with all needs in reach.      If plan is discharge home, recommend the following: Help with stairs or ramp for entrance;Assist for transportation   Can travel by private vehicle        Equipment Recommendations Rollator (4 wheels)  Recommendations for Other Services       Functional Status Assessment Patient has had a recent decline in their functional status and demonstrates the ability to make significant improvements in function in a reasonable and predictable amount of time.     Precautions / Restrictions Precautions Precautions: Fall Recall of Precautions/Restrictions: Intact Restrictions Weight Bearing Restrictions Per Provider Order: No       Mobility  Bed Mobility Overal bed mobility: Needs Assistance Bed Mobility: Supine to Sit     Supine to sit: Supervision, HOB elevated       Patient Response: Cooperative  Transfers Overall transfer level: Needs assistance Equipment used: None Transfers: Sit to/from Stand Sit to Stand: Supervision                Ambulation/Gait Ambulation/Gait assistance: Supervision Gait Distance (Feet): 200 Feet (first 20' without AD to bathroom. Furniture cruising) Hydrologist (4 wheels) Gait Pattern/deviations: Step-through pattern, Decreased step length - right, Decreased step length - left       General Gait Details: x1 seated rest break on rollator seat in hallway  Stairs            Wheelchair Mobility     Tilt Bed Tilt Bed Patient Response: Cooperative  Modified Rankin (Stroke Patients Only)       Balance Overall balance assessment: Needs assistance Sitting-balance support: No upper extremity supported, Feet supported Sitting balance-Leahy Scale: Good Sitting balance - Comments: able to anteriorly trunk lean to pull sock up on R foot   Standing balance support: No upper extremity supported Standing balance-Leahy Scale: Fair                               Pertinent Vitals/Pain Pain Assessment Pain Assessment: No/denies pain    Home Living Family/patient expects to be discharged to:: Private residence Living Arrangements: Spouse/significant other Available Help at Discharge: Family;Available 24 hours/day Type of Home: House Home Access: Stairs to enter Entrance Stairs-Rails: Right;Left;Can reach both Entrance Stairs-Number of Steps: 4  Home Layout: One level Home Equipment: Agricultural Consultant (2 wheels);Standard Walker;Cane - single point;Shower seat;Transport chair      Prior Function Prior Level of Function : Independent/Modified Independent                     Extremity/Trunk Assessment   Upper  Extremity Assessment Upper Extremity Assessment: Overall WFL for tasks assessed    Lower Extremity Assessment Lower Extremity Assessment: Generalized weakness    Cervical / Trunk Assessment Cervical / Trunk Assessment: Normal  Communication   Communication Communication: No apparent difficulties    Cognition Arousal: Alert Behavior During Therapy: WFL for tasks assessed/performed   PT - Cognitive impairments: No apparent impairments                         Following commands: Intact       Cueing Cueing Techniques: Verbal cues     General Comments General comments (skin integrity, edema, etc.): HR and SPO2 WNL on RA prior and post gait    Exercises Other Exercises Other Exercises: energy conservation techniques. Benefits of RW versus rollator.   Assessment/Plan    PT Assessment Patient needs continued PT services  PT Problem List Decreased strength;Decreased mobility;Decreased activity tolerance;Decreased balance       PT Treatment Interventions DME instruction;Therapeutic exercise;Gait training;Balance training;Stair training;Neuromuscular re-education;Functional mobility training;Therapeutic activities;Patient/family education    PT Goals (Current goals can be found in the Care Plan section)  Acute Rehab PT Goals Patient Stated Goal: to return home PT Goal Formulation: With patient Time For Goal Achievement: 11/05/23 Potential to Achieve Goals: Good    Frequency Min 1X/week     Co-evaluation               AM-PAC PT 6 Clicks Mobility  Outcome Measure Help needed turning from your back to your side while in a flat bed without using bedrails?: A Little Help needed moving from lying on your back to sitting on the side of a flat bed without using bedrails?: A Little Help needed moving to and from a bed to a chair (including a wheelchair)?: A Little Help needed standing up from a chair using your arms (e.g., wheelchair or bedside chair)?: A  Little Help needed to walk in hospital room?: A Little Help needed climbing 3-5 steps with a railing? : A Lot 6 Click Score: 17    End of Session Equipment Utilized During Treatment: Gait belt Activity Tolerance: Patient tolerated treatment well Patient left: in chair;with call bell/phone within reach Nurse Communication: Mobility status PT Visit Diagnosis: Unsteadiness on feet (R26.81);Muscle weakness (generalized) (M62.81)    Time: 1030-1103 PT Time Calculation (min) (ACUTE ONLY): 33 min   Charges:   PT Evaluation $PT Eval Low Complexity: 1 Low PT Treatments $Self Care/Home Management: 8-22 PT General Charges $$ ACUTE PT VISIT: 1 Visit       Dorina HERO. Fairly IV, PT, DPT Physical Therapist- [place]  [facility]  10/22/2023, 12:02 PM

## 2023-10-22 NOTE — Discharge Summary (Signed)
 Physician Discharge Summary   Patient: Misty Hebert MRN: [id number] DOB: 1950-08-24  Admit date:     10/18/2023  Discharge date: 10/22/23  Discharge Physician: Montey Apa   PCP: Arno Lapidus, MD   Recommendations at discharge:    Follow up as scheduled with Vascular Surgery Follow up with Primary Care Repeat CBC, CMP at follow up Follow up on BP and resume held meds below when appropriate  Discharge Diagnoses: Principal Problem:   Bilateral pulmonary embolism (HCC) Active Problems:   Essential hypertension   CAD (coronary artery disease)   HLD (hyperlipidemia)   Chronic heart failure with preserved ejection fraction (HFpEF) (HCC)   Morbid obesity with BMI of 40.0-44.9, adult (HCC)  Resolved Problems:   * No resolved hospital problems. *  Hospital Course:  "ZOWIE LUNDAHL is a 73 y.o. female with medical history significant of DVT not on anticoagulants, HTN, HLD, DM, CAD, dCHF, morbid obesity, diverticulitis, s/p of colon resection, overactive bladder, migraine, who presents with chest pain.    Patient states that her chest pain started in the afternoon at 6:15 PM.  It is located in the front chest, constant, sharp, severe, radiating to the back.  Not aggravated or alleviated by any known factors.  Denies SOB, cough, fever or chills.  Patient has nausea, no vomiting, diarrhea or abdominal pain.  No symptoms of UTI.  No rectal bleeding or dark stool.  Patient states that she had 4 times of DVT in the past, currently not taking any anticoagulants.  Denies tenderness in the calf areas.   Data reviewed independently and ED Course: pt was found to have WBC 10.4, creatinine 1.09, BUN 16, GFR 54, troponin 15 --> 15.  Temperature normal, blood pressure 109/66, heart rate 80, oxygen saturation 96% on room air.  Chest x-ray negative.  Patient is admitted to PCU as inpatient.  Dr. Prescilla Brod of [facility] is consulted."     Further hospital course and management as outlined  below.  4/18 -- pt is doing well after procedures.  Ambulated around the unit and overall felt okay, needed a short rest break.  Pt is medically stable, cleared by Vascular Surgery and requesting discharge home today.      Assessment and Plan:  Acute pulmonary embolism [facility]):  Presented for evaluation of chest pain and near syncope CTA showed  extensive pulmonary emboli worse on the right than the left without CT evidence of right heart strain.  Currently hemodynamically stable.  No hypoxia Right lower extremity DVT Lower extremity Doppler shows nonocclusive DVT in the right common femoral, popliteal, and calf veins. No evidence for DVT in the left lower extremity. --Vascular surgery following --s/p Pulmonary thrombectomy 4/16 --s/p RLE thrombectomy 4/17 --Transitioned from IV heparin  >> PO Eliquis  10 mg BID x 7 days, then 5 mg BID    Essential hypertension: --Continue carvedilol  --Holding HCTZ, Cozaar , oral hydralazine  since patient is at risk of developing hypotension due to extensive PE. --Recommend home BP monitoring and close PCP follow up --Resume held meds gradually, as BP tolerates   CAD (coronary artery disease):  --Continue carvedilol , Zetia , sublingual nitroglycerin  and pravastatin    Chronic heart failure with preserved ejection fraction (HFpEF) (HCC):  2D echo on 01/05/2017 showed EF> 55% with grade 1 diastolic dysfunction.   CHF is compensated. --Monitor volume status   Class III Obesity Body mass index is 40.38 kg/m. Complicates overall prognosis and care. Lifestyle modification and exercise has been discussed with patient in detail.  Consultants: Vascular surgery  Procedures performed: pulmonary thrombectomy, RLE thrombectomy   Disposition: Home  Diet recommendation:  Discharge Diet Orders (From admission, onward)     Start     Ordered   10/22/23 0000  Diet - low sodium heart healthy        10/22/23 1123            DISCHARGE  MEDICATION: Allergies as of 10/22/2023       Reactions   Chlorpromazine Anaphylaxis, Other (See Comments)   Other Reaction: LOC Other Reaction: LOC Other Reaction: LOC Other Reaction: LOC   Metoclopramide Other (See Comments), Anaphylaxis   Other Reaction: RESTLESS Twitching , cant sit still  Other Reaction: RESTLESS Other Reaction: RESTLESS Other Reaction: RESTLESS   Sulfamethizole Nausea And Vomiting, Other (See Comments)   Amlodipine     BLLE edema   Other Other (See Comments)   Torazine gave her syncope Upset stomach Upset stomach   Celecoxib Nausea Only, Anxiety   Meloxicam  Other (See Comments), Diarrhea        Medication List     PAUSE taking these medications    hydrALAZINE  25 MG tablet Wait to take this until your doctor or other care provider tells you to start again. Commonly known as: APRESOLINE  Take 25 mg by mouth 2 (two) times daily.   hydrochlorothiazide  25 MG tablet Wait to take this until your doctor or other care provider tells you to start again. Commonly known as: HYDRODIURIL  Take 1 tablet (25 mg total) by mouth daily.   losartan  100 MG tablet Wait to take this until your doctor or other care provider tells you to start again. Commonly known as: COZAAR  Take 100 mg by mouth daily.       STOP taking these medications    methylPREDNISolone  4 MG Tbpk tablet Commonly known as: MEDROL  DOSEPAK   mupirocin  ointment 2 % Commonly known as: BACTROBAN        TAKE these medications    Apixaban  Starter Pack (10mg  and 5mg ) Commonly known as: ELIQUIS  STARTER PACK Take as directed on package: start with two-5mg  tablets twice daily for 7 days. On day 8, switch to one-5mg  tablet twice daily.   carvedilol  6.25 MG tablet Commonly known as: COREG  Take 6.25 mg by mouth 2 (two) times daily with a meal.   cyanocobalamin 1000 MCG/ML injection Commonly known as: VITAMIN B12 Inject 1,000 mcg into the muscle every 30 (thirty) days.   dicyclomine  10 MG  capsule Commonly known as: BENTYL  Take 10 mg by mouth 2 (two) times daily as needed.   ezetimibe  10 MG tablet Commonly known as: ZETIA  Take 10 mg by mouth daily.   fluticasone  50 MCG/ACT nasal spray Commonly known as: FLONASE  Place 2 sprays into both nostrils daily.   nitroGLYCERIN  0.4 MG SL tablet Commonly known as: Nitrostat  Place 1 tablet (0.4 mg total) under the tongue every 5 (five) minutes as needed for chest pain (maximum of 3 doses.).   omeprazole 20 MG capsule Commonly known as: PRILOSEC Take 20 mg by mouth daily.   Ozempic (0.25 or 0.5 MG/DOSE) 2 MG/1.5ML Sopn Generic drug: Semaglutide(0.25 or 0.5MG /DOS) Inject 0.5 mg into the skin once a week.   pravastatin  80 MG tablet Commonly known as: PRAVACHOL  Take 1 tablet (80 mg total) by mouth daily.   Repatha SureClick 140 MG/ML Soaj Generic drug: Evolocumab Inject 140 mg into the skin every 14 (fourteen) days.               Durable Medical  Equipment  (From admission, onward)           Start     Ordered   10/22/23 1111  For home use only DME 4 wheeled rolling walker with seat  Once       Question:  Patient needs a walker to treat with the following condition  Answer:  Dyspnea on exertion   10/22/23 1110            Discharge Exam: Filed Weights   10/20/23 0515 10/21/23 0500 10/22/23 0530  Weight: 103.6 kg 103.4 kg 101.7 kg   General exam: awake, alert, no acute distress, obese HEENT: moist mucus membranes, hearing grossly normal  Respiratory system: CTAB, no wheezes, rales or rhonchi, normal respiratory effort. Cardiovascular system: normal S1/S2, RRR, no JVD, murmurs, rubs, gallops, no pedal edema.   Gastrointestinal system: soft, NT, ND, no HSM felt, +bowel sounds. Central nervous system: A&O x 3. no gross focal neurologic deficits, normal speech Extremities: moves all , no edema, normal tone Skin: dry, intact, normal temperature Psychiatry: normal mood, congruent affect, judgement and  insight appear normal   Condition at discharge: stable  The results of significant diagnostics from this hospitalization (including imaging, microbiology, ancillary and laboratory) are listed below for reference.   Imaging Studies: PERIPHERAL VASCULAR CATHETERIZATION Result Date: 10/21/2023 See surgical note for result.  PERIPHERAL VASCULAR CATHETERIZATION Result Date: 10/20/2023 See surgical note for result.  ECHOCARDIOGRAM COMPLETE Result Date: 10/19/2023    ECHOCARDIOGRAM REPORT   Patient Name:   TAIYANA KISSLER Date of Exam: 10/19/2023 Medical Rec #:  [id number]       Height:       63.0 in Accession #:    [id number]      Weight:       234.1 lb Date of Birth:  02-Nov-1950       BSA:          2.068 m Patient Age:    72 years        BP:           128/74 mmHg Patient Gender: F               HR:           62 bpm. Exam Location:  [facility] Procedure: 2D Echo, Cardiac Doppler, Color Doppler and Strain Analysis (Both            Spectral and Color Flow Doppler were utilized during procedure). Indications:     Pulmonary embolus  History:         Patient has no prior history of Echocardiogram examinations.                  CHF, CAD, Signs/Symptoms:Chest Pain and Dyspnea; Risk                  Factors:Hypertension and Dyslipidemia. Pulmonary embolus.  Sonographer:     Clarke Crouch Referring Phys:  [id number] XILIN NIU Diagnosing Phys: Sammy Crisp MD  Sonographer Comments: Patient is obese. Global longitudinal strain was attempted. IMPRESSIONS  1. Left ventricular ejection fraction, by estimation, is 65 to 70%. The left ventricle has normal function. The left ventricle has no regional wall motion abnormalities. There is mild left ventricular hypertrophy. Left ventricular diastolic parameters are consistent with Grade I diastolic dysfunction (impaired relaxation). The average left ventricular global longitudinal strain is -12.5 %. The global longitudinal strain is abnormal.  2. Right ventricular systolic function is  normal. The right ventricular size is normal.  Mildly increased right ventricular wall thickness. There is normal pulmonary artery systolic pressure.  3. The mitral valve is normal in structure. Trivial mitral valve regurgitation. No evidence of mitral stenosis.  4. The aortic valve has an indeterminant number of cusps. There is mild thickening of the aortic valve. Aortic valve regurgitation is not visualized. Aortic valve sclerosis is present, with no evidence of aortic valve stenosis.  5. The inferior vena cava is normal in size with greater than 50% respiratory variability, suggesting right atrial pressure of 3 mmHg. FINDINGS  Left Ventricle: Left ventricular ejection fraction, by estimation, is 65 to 70%. The left ventricle has normal function. The left ventricle has no regional wall motion abnormalities. The average left ventricular global longitudinal strain is -12.5 %. Strain was performed and the global longitudinal strain is abnormal. The left ventricular internal cavity size was normal in size. There is mild left ventricular hypertrophy. Left ventricular diastolic parameters are consistent with Grade I diastolic dysfunction (impaired relaxation). Right Ventricle: The right ventricular size is normal. Mildly increased right ventricular wall thickness. Right ventricular systolic function is normal. There is normal pulmonary artery systolic pressure. The tricuspid regurgitant velocity is 2.28 m/s, and with an assumed right atrial pressure of 3 mmHg, the estimated right ventricular systolic pressure is 23.8 mmHg. Left Atrium: Left atrial size was normal in size. Right Atrium: Right atrial size was normal in size. Pericardium: There is no evidence of pericardial effusion. Mitral Valve: The mitral valve is normal in structure. Trivial mitral valve regurgitation. No evidence of mitral valve stenosis. MV peak gradient, 3.8 mmHg. The mean mitral valve gradient is 1.0 mmHg. Tricuspid Valve: The tricuspid valve is not  well visualized. Tricuspid valve regurgitation is trivial. Aortic Valve: The aortic valve has an indeterminant number of cusps. There is mild thickening of the aortic valve. Aortic valve regurgitation is not visualized. Aortic valve sclerosis is present, with no evidence of aortic valve stenosis. Aortic valve mean gradient measures 5.0 mmHg. Aortic valve peak gradient measures 11.2 mmHg. Aortic valve area, by VTI measures 1.96 cm. Pulmonic Valve: The pulmonic valve was normal in structure. Pulmonic valve regurgitation is not visualized. No evidence of pulmonic stenosis. Aorta: The aortic root is normal in size and structure. Pulmonary Artery: The pulmonary artery is of normal size. Venous: The inferior vena cava is normal in size with greater than 50% respiratory variability, suggesting right atrial pressure of 3 mmHg. IAS/Shunts: No atrial level shunt detected by color flow Doppler.  LEFT VENTRICLE PLAX 2D LVIDd:         3.90 cm   Diastology LVIDs:         2.80 cm   LV e' medial:    5.00 cm/s LV PW:         1.10 cm   LV E/e' medial:  12.9 LV IVS:        1.04 cm   LV e' lateral:   8.70 cm/s LVOT diam:     1.90 cm   LV E/e' lateral: 7.4 LV SV:         63 LV SV Index:   30        2D Longitudinal Strain LVOT Area:     2.84 cm  2D Strain GLS Avg:     -12.5 %  RIGHT VENTRICLE RV Basal diam:  3.15 cm RV Mid diam:    2.80 cm RV S prime:     9.90 cm/s TAPSE (M-mode): 2.3 cm LEFT ATRIUM  Index        RIGHT ATRIUM           Index LA diam:        3.60 cm 1.74 cm/m   RA Area:     13.10 cm LA Vol (A2C):   23.5 ml 11.37 ml/m  RA Volume:   30.70 ml  14.85 ml/m LA Vol (A4C):   36.5 ml 17.65 ml/m LA Biplane Vol: 30.3 ml 14.66 ml/m  AORTIC VALVE                     PULMONIC VALVE AV Area (Vmax):    1.80 cm      PV Vmax:       0.88 m/s AV Area (Vmean):   1.47 cm      PV Peak grad:  3.1 mmHg AV Area (VTI):     1.96 cm AV Vmax:           167.00 cm/s AV Vmean:          104.000 cm/s AV VTI:            0.319 m AV Peak  Grad:      11.2 mmHg AV Mean Grad:      5.0 mmHg LVOT Vmax:         106.00 cm/s LVOT Vmean:        54.000 cm/s LVOT VTI:          0.221 m LVOT/AV VTI ratio: 0.69  AORTA Ao Root diam: 3.00 cm MITRAL VALVE               TRICUSPID VALVE MV Area (PHT): 2.10 cm    TR Peak grad:   20.8 mmHg MV Area VTI:   1.99 cm    TR Vmax:        228.00 cm/s MV Peak grad:  3.8 mmHg MV Mean grad:  1.0 mmHg    SHUNTS MV Vmax:       0.98 m/s    Systemic VTI:  0.22 m MV Vmean:      52.1 cm/s   Systemic Diam: 1.90 cm MV Decel Time: 362 msec MV E velocity: 64.50 cm/s MV A velocity: 94.30 cm/s MV E/A ratio:  0.68 Veryl Gottron End MD Electronically signed by Sammy Crisp MD Signature Date/Time: 10/19/2023/4:47:05 PM    Final    US  Venous Img Lower Bilateral (DVT) Result Date: 10/19/2023 CLINICAL DATA:  Acute pulmonary embolus. EXAM: BILATERAL LOWER EXTREMITY VENOUS DOPPLER ULTRASOUND TECHNIQUE: Gray-scale sonography with graded compression, as well as color Doppler and duplex ultrasound were performed to evaluate the lower extremity deep venous systems from the level of the common femoral vein and including the common femoral, femoral, profunda femoral, popliteal and calf veins including the posterior tibial, peroneal and gastrocnemius veins when visible. The superficial great saphenous vein was also interrogated. Spectral Doppler was utilized to evaluate flow at rest and with distal augmentation maneuvers in the common femoral, femoral and popliteal veins. COMPARISON:  12/30/2022 FINDINGS: RIGHT LOWER EXTREMITY Common Femoral Vein: Nonocclusive thrombus. Saphenofemoral Junction: No evidence of thrombus. Normal compressibility and flow on color Doppler imaging. Profunda Femoral Vein: No evidence of thrombus. Normal compressibility and flow on color Doppler imaging. Femoral Vein: Nonocclusive thrombus Popliteal Vein: Nonocclusive thrombus Calf Veins: Nonocclusive thrombus Other Findings:  None. LEFT LOWER EXTREMITY Common Femoral Vein: No  evidence of thrombus. Normal compressibility, respiratory phasicity and response to augmentation. Saphenofemoral Junction: No evidence of thrombus. Normal compressibility and flow on  color Doppler imaging. Profunda Femoral Vein: No evidence of thrombus. Normal compressibility and flow on color Doppler imaging. Femoral Vein: No evidence of thrombus. Normal compressibility, respiratory phasicity and response to augmentation. Popliteal Vein: No evidence of thrombus. Normal compressibility, respiratory phasicity and response to augmentation. Calf Veins: No evidence of thrombus. Normal compressibility and flow on color Doppler imaging. Superficial Great Saphenous Vein: No evidence of thrombus. Normal compressibility. Venous Reflux:  None. Other Findings:  None. IMPRESSION: 1. Exam is positive for nonocclusive DVT in the right common femoral, femoral, popliteal, and calf veins in this patient with acute pulmonary embolus diagnosed by CTA chest 7 hours ago. 2. No evidence for DVT in the left lower extremity. Electronically Signed   By: Donnal Fusi M.D.   On: 10/19/2023 06:24   CT Angio Chest PE W and/or Wo Contrast Result Date: 10/18/2023 CLINICAL DATA:  Chest pain EXAM: CT ANGIOGRAPHY CHEST WITH CONTRAST TECHNIQUE: Multidetector CT imaging of the chest was performed using the standard protocol during bolus administration of intravenous contrast. Multiplanar CT image reconstructions and MIPs were obtained to evaluate the vascular anatomy. RADIATION DOSE REDUCTION: This exam was performed according to the departmental dose-optimization program which includes automated exposure control, adjustment of the mA and/or kV according to patient size and/or use of iterative reconstruction technique. CONTRAST:  OMNIPAQUE  IOHEXOL  350 MG/ML SOLN COMPARISON:  None Available. FINDINGS: Cardiovascular: Thoracic aorta shows no aneurysmal dilatation or dissection. Mild coronary calcifications are seen. Pulmonary artery is well  visualized with extensive bilateral pulmonary emboli worse on the right than the left. The main right pulmonary artery is filled with thrombus. No definitive right heart strain is noted. Mediastinum/Nodes: Thoracic inlet is within normal limits. No hilar or mediastinal adenopathy is noted. The esophagus as visualized is within normal limits. Small sliding-type hiatal hernia is noted. Lungs/Pleura: Lungs are well aerated bilaterally. No focal confluent infiltrate or effusion is seen. Minimal left basilar atelectasis is noted. Upper Abdomen: Fatty infiltration of the liver is noted. An anterior abdominal wall hernia is noted containing a loop transverse colon. No obstructive changes are noted. Musculoskeletal: No chest wall abnormality. No acute or significant osseous findings. Review of the MIP images confirms the above findings. IMPRESSION: Extensive pulmonary emboli worse on the right than the left without evidence of right heart strain. Anterior abdominal wall hernia on the left containing a loop of transverse colon without obstructive change. Chronic changes as described above. Critical Value/emergent results were called by telephone at the time of interpretation on 10/18/2023 at 11:29 pm to Dr. MICHAEL MIAN , who verbally acknowledged these results. Electronically Signed   By: Violeta Grey M.D.   On: 10/18/2023 23:37   DG Chest 2 View Result Date: 10/18/2023 CLINICAL DATA:  Chest pain EXAM: CHEST - 2 VIEW COMPARISON:  04/20/2016 FINDINGS: Cardiac shadow is within normal limits. The lungs are well aerated bilaterally. No focal infiltrate or effusion is noted. No bony abnormality is seen. Postsurgical changes in the cervical spine are noted. IMPRESSION: No active cardiopulmonary disease. Electronically Signed   By: Violeta Grey M.D.   On: 10/18/2023 22:03    Microbiology: Results for orders placed or performed during the hospital encounter of 10/24/20  Resp Panel by RT-PCR (Flu A&B, Covid) Nasopharyngeal  Swab     Status: Abnormal   Collection Time: 10/24/20 11:05 AM   Specimen: Nasopharyngeal Swab; Nasopharyngeal(NP) swabs in vial transport medium  Result Value Ref Range Status   SARS Coronavirus 2 by RT PCR POSITIVE (A) NEGATIVE  Corrected    Comment: PAULA THOMAS 1150 10/24/2020 CVP (NOTE) SARS-CoV-2 target nucleic acids are DETECTED.  The SARS-CoV-2 RNA is generally detectable in upper respiratory specimens during the acute phase of infection. Positive results are indicative of the presence of the identified virus, but do not rule out bacterial infection or co-infection with other pathogens not detected by the test. Clinical correlation with patient history and other diagnostic information is necessary to determine patient infection status. The expected result is Negative.  Fact Sheet for Patients: BloggerCourse.com  Fact Sheet for Healthcare Providers: SeriousBroker.it  This test is not yet approved or cleared by the [place]  FDA and  has been authorized for detection and/or diagnosis of SARS-CoV-2 by FDA under an Emergency Use Authorization (EUA).  This EUA will remain in effect (meaning this test can be used) for the duration of  the COVID-19 declaratio n under Section 564(b)(1) of the Act, 21 U.S.C. section 360bbb-3(b)(1), unless the authorization is terminated or revoked sooner.  CORRECTED ON 04/21 AT 1157: PREVIOUSLY REPORTED AS POSITIVE RESULT CALLED TO, READ BACK BY AND VERIFIED WITH: PAULA THOMAS    Influenza A by PCR NEGATIVE NEGATIVE Final   Influenza B by PCR NEGATIVE NEGATIVE Final    Comment: (NOTE) The Xpert Xpress SARS-CoV-2/FLU/RSV plus assay is intended as an aid in the diagnosis of influenza from Nasopharyngeal swab specimens and should not be used as a sole basis for treatment. Nasal washings and aspirates are unacceptable for Xpert Xpress SARS-CoV-2/FLU/RSV testing.  Fact Sheet for  Patients: BloggerCourse.com  Fact Sheet for Healthcare Providers: SeriousBroker.it  This test is not yet approved or cleared by the [place]  FDA and has been authorized for detection and/or diagnosis of SARS-CoV-2 by FDA under an Emergency Use Authorization (EUA). This EUA will remain in effect (meaning this test can be used) for the duration of the COVID-19 declaration under Section 564(b)(1) of the Act, 21 U.S.C. section 360bbb-3(b)(1), unless the authorization is terminated or revoked.  Performed at [facility], [address] [place], [place] [address]     Labs: CBC: Recent Labs  Lab 10/18/23 2003 10/19/23 0758 10/20/23 0234 10/21/23 0405 10/22/23 0020  WBC 10.4 8.0 8.8 7.6 7.7  HGB 15.5* 14.2 13.4 12.2 11.8*  HCT 44.8 40.7 38.9 34.5* 33.4*  MCV 92.2 92.5 91.5 91.0 92.5  PLT 182 164 162 162 155   Basic Metabolic Panel: Recent Labs  Lab 10/18/23 2003 10/19/23 0758 10/21/23 0405 10/22/23 0020  NA 140 136 137 133*  K 3.6 3.7 3.8 3.9  CL 104 102 104 102  CO2 28 26 26 22   GLUCOSE 155* 155* 139* 129*  BUN 16 19 14 13   CREATININE 1.09* 1.21* 0.79 0.90  CALCIUM 9.2 8.6* 8.7* 8.5*  MG  --   --   --  1.7   Liver Function Tests: Recent Labs  Lab 10/18/23 2219  AST 23  ALT 27  ALKPHOS 77  BILITOT 1.1  PROT 6.4*  ALBUMIN 3.6   CBG: Recent Labs  Lab 10/21/23 0822 10/21/23 1234 10/21/23 1641 10/21/23 2035 10/22/23 0838  GLUCAP 121* 121* 117* 132* 108*    Discharge time spent: greater than 30 minutes.  Signed: Montey Apa, DO [facility] 10/22/2023

## 2023-10-22 NOTE — Plan of Care (Signed)
 Pt D/C home with family BP 132/65 (BP Location: Left Arm)   Pulse 74   Temp 98.8 F (37.1 C)   Resp 18   Ht 5\' 3"  (1.6 m)   Wt 101.7 kg   LMP  (LMP Unknown)   SpO2 98%   BMI 39.72 kg/m  AVS reviewed questions answered IV removed medical equipment removed waiting on pharmacy and DME.  Anders Katz 10/22/23 2:07 PM

## 2023-10-22 NOTE — Progress Notes (Signed)
 ANTICOAGULATION CONSULT NOTE  Pharmacy Consult for heparin  infusion Indication: pulmonary embolus  Allergies  Allergen Reactions   Chlorpromazine Anaphylaxis and Other (See Comments)    Other Reaction: LOC Other Reaction: LOC Other Reaction: LOC Other Reaction: LOC    Metoclopramide Other (See Comments) and Anaphylaxis    Other Reaction: RESTLESS Twitching , cant sit still  Other Reaction: RESTLESS Other Reaction: RESTLESS Other Reaction: RESTLESS    Sulfamethizole Nausea And Vomiting and Other (See Comments)   Amlodipine      BLLE edema   Other Other (See Comments)    Torazine gave her syncope Upset stomach Upset stomach    Celecoxib Nausea Only and Anxiety   Meloxicam  Other (See Comments) and Diarrhea   Patient Measurements: Height: 5\' 3"  (160 cm) Weight: 103.4 kg (227 lb 15.3 oz) IBW/kg (Calculated) : 52.4 Heparin  Dosing Weight: 77.7 kg  Vital Signs: Temp: 98.7 F (37.1 C) (04/17 2350) Temp Source: Oral (04/17 1640) BP: 123/55 (04/17 2350) Pulse Rate: 78 (04/17 2350)  Labs: Recent Labs    10/19/23 0758 10/19/23 1710 10/20/23 0101 10/20/23 0234 10/21/23 0405 10/22/23 0020  HGB 14.2  --   --  13.4 12.2 11.8*  HCT 40.7  --   --  38.9 34.5* 33.4*  PLT 164  --   --  162 162 155  HEPARINUNFRC >1.10*   < > 0.62  --  0.77* 0.38  CREATININE 1.21*  --   --   --  0.79 0.90   < > = values in this interval not displayed.   Estimated Creatinine Clearance: 64.9 mL/min (by C-G formula based on SCr of 0.9 mg/dL).  Medical History: Past Medical History:  Diagnosis Date   Actinic keratosis    Arthritis of knee    Benign essential HTN    Chest pain    a. she reports 4 normal heart caths; b. 12/2013 St Echo: nl Ivette Marks); c. 04/2016 MV: apical and ant defect @ rest consistent w/ breast attenuation. No ischemia. EF 55-65%.   Depression    Diverticulitis of intestine w/o perforation or abscess w/o bleeding    DVT (deep venous thrombosis) (HCC)    Dysphagia     Fibromyalgia    High cholesterol    History of colon resection    History of echocardiogram    a. 01/2017 Echo ([facility]): nl LV syst fxn.   Hyperlipidemia    Medial meniscus tear    Migraine    Neuromuscular disorder (HCC)    Obesity    Overactive bladder    Reflux    Subchondral insufficiency fracture of condyle of femur (HCC)    Tension headache    Assessment: Pt is a 73 yo female presenting to ED for CP, found with "extensive pulmonary emboli worse on the right than the left without evidence of right heart strain."  0415 0758 HL >1.10, SUPRAtherapeutic    0415 1710 HL 0.66, therapeutic x 1 0416 0101 HL 0.62, therapeutic x 2 0417 0405 HL 0.77, supratherapeutic 0418 0020 HL 0.38, therapeutic x 1  Goal of Therapy:  Heparin  level 0.3-0.7 units/ml Monitor platelets by anticoagulation protocol: Yes   Plan:  Continue heparin  infusion at rate of 1000 units/hr Recheck HL 8 hours to confirm Monitor CBC daily while on heparin     Coretta Dexter, PharmD, [facility] 10/22/2023 2:34 AM

## 2023-10-22 NOTE — Progress Notes (Signed)
 Progress Note    10/22/2023 7:28 AM 1 Day Post-Op  Subjective:  Misty Hebert is a 73 year old female now postop day  #2 from a pulmonary thrombectomy due to pulmonary embolism, post op day #1 from right lower extremity Mechanical Thrombectomy to the right popliteal vein, superficial femoral vein and common femoral vein.  Patient is resting comfortably in bed this morning.  Patient is not requiring any nasal cannula oxygen.  Patient endorses she is breathing much better feels better.  She denies having any chest pain.  Patient endorses some pressure and pain to her right lower leg. Leg is wrapped with coban for venous pressure support. Patient has not been up to ambulate yet this morning.  Patient's right groin is clean dry and intact without any problems.  She does endorse some soreness to that area but it is tolerable.  No complaints overnight vitals are remained stable.   Review of Systems  Constitutional:  Constitutional negative. Respiratory: Respiratory negative.  Cardiovascular: Cardiovascular negative.  GI: Gastrointestinal negative.  GU: Genitourinary negative. Musculoskeletal: Musculoskeletal negative.  Neurological: Neurological negative. Psychiatric: Psychiatric negative.  Vitals:   10/21/23 2350 10/22/23 0400  BP: (!) 123/55 117/63  Pulse: 78 71  Resp: 18 18  Temp: 98.7 F (37.1 C) 98.4 F (36.9 C)  SpO2: 92% 93%   Physical Exam: Cardiac:  RRR, normal S1 and S2, no rubs clicks gallops or murmurs to note. Lungs: Normal labored breathing, they are on auscultation but diminished throughout the right lower and right middle lobe.  No rales rhonchi or wheezing on the left. Incisions: Right groin incision with dressing clean dry and intact no hematoma seroma to note. Extremities: All extremities with palpable pulses.  All extremities warm to touch.  Right lower extremity with +1 edema compared to the left due to extensive DVT. Abdomen: Morbidly obese, positive bowel sounds  throughout, soft, nontender and nondistended. Neurologic: And oriented x 3, completely neuro intact.  CBC    Component Value Date/Time   WBC 7.7 10/22/2023 0020   RBC 3.61 (L) 10/22/2023 0020   HGB 11.8 (L) 10/22/2023 0020   HGB 16.2 (H) 09/09/2020 1513   HCT 33.4 (L) 10/22/2023 0020   HCT 47.7 (H) 09/09/2020 1513   PLT 155 10/22/2023 0020   PLT 188 09/09/2020 1513   MCV 92.5 10/22/2023 0020   MCV 93 09/09/2020 1513   MCV 93 10/26/2014 2240   MCH 32.7 10/22/2023 0020   MCHC 35.3 10/22/2023 0020   RDW 13.3 10/22/2023 0020   RDW 12.6 09/09/2020 1513   RDW 13.5 10/26/2014 2240   LYMPHSABS 2.5 10/26/2014 2240   MONOABS 0.7 10/26/2014 2240   EOSABS 0.4 10/26/2014 2240   BASOSABS 0.1 10/26/2014 2240    BMET    Component Value Date/Time   NA 133 (L) 10/22/2023 0020   NA 141 05/07/2021 1018   NA 137 10/26/2014 2240   K 3.9 10/22/2023 0020   K 4.0 10/26/2014 2240   CL 102 10/22/2023 0020   CL 102 10/26/2014 2240   CO2 22 10/22/2023 0020   CO2 25 10/26/2014 2240   GLUCOSE 129 (H) 10/22/2023 0020   GLUCOSE 174 (H) 10/26/2014 2240   BUN 13 10/22/2023 0020   BUN 11 05/07/2021 1018   BUN 13 10/26/2014 2240   CREATININE 0.90 10/22/2023 0020   CREATININE 1.02 (H) 10/26/2014 2240   CALCIUM 8.5 (L) 10/22/2023 0020   CALCIUM 9.1 10/26/2014 2240   GFRNONAA >60 10/22/2023 0020   GFRNONAA 58 (L) 10/26/2014  2240   GFRAA >60 12/27/2019 1210   GFRAA >60 10/26/2014 2240    INR    Component Value Date/Time   INR 1.1 10/18/2023 2215   INR 1.1 07/14/2012 0505     Intake/Output Summary (Last 24 hours) at 10/22/2023 0728 Last data filed at 10/21/2023 1900 Gross per 24 hour  Intake 360 ml  Output 400 ml  Net -40 ml     Assessment/Plan:  73 y.o. female is s/p  pulmonary thrombectomy and right lower extremity thrombectomy  1 Day Post-Op   PLAN Stop heparin  infusion this morning.  Convert patient to oral anticoagulation Eliquis  10 mg twice daily for 7 days then decrease dosage  to 5 mg twice daily.  Convert to oral anticoagulation prior to discharge today. Patient instructed to remove right lower extremity leg wrapped tomorrow after showering. Patient to follow up with Vascular Surgery in 6 weeks  Okay per vascular surgery for patient to discharge to home when medically stable.   DVT prophylaxis: Heparin  infusion to be converted to oral anticoagulation today prior to discharge.   Annamaria Barrette [facility] 10/22/2023 7:28 AM

## 2023-10-22 NOTE — Plan of Care (Signed)
  Problem: Education: Goal: Ability to describe self-care measures that may prevent or decrease complications (Diabetes Survival Skills Education) will improve 10/22/2023 1407 by Anders Katz, RN Outcome: Adequate for Discharge 10/22/2023 1406 by Anders Katz, RN Outcome: Adequate for Discharge Goal: Individualized Educational Video(s) 10/22/2023 1407 by Anders Katz, RN Outcome: Adequate for Discharge 10/22/2023 1406 by Anders Katz, RN Outcome: Adequate for Discharge   Problem: Coping: Goal: Ability to adjust to condition or change in health will improve 10/22/2023 1407 by Anders Katz, RN Outcome: Adequate for Discharge 10/22/2023 1406 by Anders Katz, RN Outcome: Adequate for Discharge   Problem: Fluid Volume: Goal: Ability to maintain a balanced intake and output will improve 10/22/2023 1407 by Anders Katz, RN Outcome: Adequate for Discharge 10/22/2023 1406 by Anders Katz, RN Outcome: Adequate for Discharge   Problem: Health Behavior/Discharge Planning: Goal: Ability to identify and utilize available resources and services will improve 10/22/2023 1407 by Anders Katz, RN Outcome: Adequate for Discharge 10/22/2023 1406 by Anders Katz, RN Outcome: Adequate for Discharge Goal: Ability to manage health-related needs will improve 10/22/2023 1407 by Anders Katz, RN Outcome: Adequate for Discharge 10/22/2023 1406 by Anders Katz, RN Outcome: Adequate for Discharge   Problem: Nutritional: Goal: Maintenance of adequate nutrition will improve 10/22/2023 1407 by Anders Katz, RN Outcome: Adequate for Discharge 10/22/2023 1406 by Anders Katz, RN Outcome: Adequate for Discharge Goal: Progress toward achieving an optimal weight will improve 10/22/2023 1407 by Anders Katz, RN Outcome: Adequate for Discharge 10/22/2023 1406 by Anders Katz, RN Outcome: Adequate for Discharge   Problem: Skin Integrity: Goal: Risk for impaired skin integrity  will decrease 10/22/2023 1407 by Anders Katz, RN Outcome: Adequate for Discharge 10/22/2023 1406 by Anders Katz, RN Outcome: Adequate for Discharge

## 2023-11-11 ENCOUNTER — Ambulatory Visit: Payer: Medicare HMO | Admitting: Dermatology

## 2023-11-18 ENCOUNTER — Ambulatory Visit: Attending: Internal Medicine | Admitting: Internal Medicine

## 2023-11-18 ENCOUNTER — Encounter: Payer: Self-pay | Admitting: Internal Medicine

## 2023-11-18 ENCOUNTER — Telehealth (INDEPENDENT_AMBULATORY_CARE_PROVIDER_SITE_OTHER): Payer: Self-pay | Admitting: Nurse Practitioner

## 2023-11-18 VITALS — BP 129/83 | HR 77 | Ht 63.0 in | Wt 230.6 lb

## 2023-11-18 DIAGNOSIS — I2699 Other pulmonary embolism without acute cor pulmonale: Secondary | ICD-10-CM

## 2023-11-18 DIAGNOSIS — I5032 Chronic diastolic (congestive) heart failure: Secondary | ICD-10-CM

## 2023-11-18 DIAGNOSIS — E1169 Type 2 diabetes mellitus with other specified complication: Secondary | ICD-10-CM | POA: Diagnosis not present

## 2023-11-18 DIAGNOSIS — I2510 Atherosclerotic heart disease of native coronary artery without angina pectoris: Secondary | ICD-10-CM

## 2023-11-18 DIAGNOSIS — E785 Hyperlipidemia, unspecified: Secondary | ICD-10-CM

## 2023-11-18 DIAGNOSIS — I700 Atherosclerosis of aorta: Secondary | ICD-10-CM | POA: Insufficient documentation

## 2023-11-18 DIAGNOSIS — I10 Essential (primary) hypertension: Secondary | ICD-10-CM

## 2023-11-18 NOTE — Progress Notes (Signed)
 Cardiology Office Note:  .   Date:  11/18/2023  ID:  Misty Hebert, DOB 1950/12/15, MRN [id number] PCP: Arno Lapidus, MD  [facility] Providers Cardiologist:  Sammy Crisp, MD     History of Present Illness: .   Misty Hebert is a 73 y.o. female with history of chronic intermittent chest pain, hypertension, hyperlipidemia, type 2 diabetes mellitus, DVT, GERD, and hiatal hernia, who presents for follow-up of recent pulmonary emboli.  She was last seen in our office in 04/2023 by Morey Ar, NP, at which time she continued to complain of chronic sharp, central chest pain radiating to the back that has been present for years.  Multiple prior coronary evaluations have been unremarkable.  She was hospitalized last month with chest pain and near syncope, found to have extensive bilateral pulmonary emboli as well as right lower extremity DVT.  She underwent pulmonary and right lower extremity thrombectomies and was discharged on apixaban .  Today, Misty Hebert has a couple of concerns.  She still has exertional dyspnea that is worse compared to 6 months ago but has not progressed since being discharged after her pulmonary embolism in April.  She was told yesterday by a home health nurse that she has heart failure, which has upset her.  She notes a few sporadic twinges of chest pain but overall nothing like what she experienced around the time of her PE.  She denies palpitations and lightheadedness.  Blood pressures at home have been well-controlled, typically in the 120s/70s.  She has been dealing with some lower extremity edema and was recently restarted on HCTZ.  In an effort to minimize risk for hypotension, losartan  was reduced from 100 mg daily to 50 mg daily.  She has not been restarted on hydralazine .  She was recently prescribed evolocumab to help improve her lipid profile in the setting of aortic atherosclerosis, though she has yet to start this.  ROS: Misty Hebert notes  some right knee and lateral thigh pain for which she underwent MRI per orthopedics yesterday.  She is still waiting on the results.  Studies Reviewed: Aaron Aas   ECG (10/18/2023): Normal sinus rhythm with left axis deviation and borderline LVH.  Mild QT prolongation also noted (QTc 477 ms).   TTE (normal LV size with mild LVH.  LVEF 65-70% with normal wall motion and grade 1 diastolic dysfunction.  GLS -12.5%.  Normal RV size with mild RVH.  Normal RV systolic function.  Normal PA pressure.  Normal biatrial size.  No pericardial effusion.  No significant valvular abnormality.  Risk Assessment/Calculations:             Physical Exam:   VS:  BP 129/83   Pulse 77   Ht 5\' 3"  (1.6 m)   Wt 230 lb 9.6 oz (104.6 kg)   LMP  (LMP Unknown)   SpO2 98%   BMI 40.85 kg/m    Wt Readings from Last 3 Encounters:  11/18/23 230 lb 9.6 oz (104.6 kg)  10/22/23 224 lb 3.3 oz (101.7 kg)  04/19/23 222 lb (100.7 kg)    General:  NAD. Neck: No JVD or HJR. Lungs: Clear to auscultation bilaterally without wheezes or crackles. Heart: Regular rate and rhythm without murmurs, rubs, or gallops. Abdomen: Soft, nontender, nondistended. Extremities: 1-2+ pretibial edema bilaterally.  ASSESSMENT AND PLAN: .    Pulmonary embolism: Misty Hebert had bilateral pulmonary emboli last month, unprovoked with history of several DVTs.  She had not been on long-term anticoagulation before this  but will now need to be on lifelong anticoagulation.  She is tolerating apixaban  well.  I suspect some of her shortness of breath is related to her hospitalization and hemodynamic effects of bilateral PE.  Echocardiogram at that time did not show significant RV dysfunction.  PA pressures could not be estimated.  High-sensitivity troponin I and BNP were normal, arguing against submassive PE.  We have agreed to repeat an echocardiogram in 2 months.  If her dyspnea does not improve or there is evidence of developing RV dysfunction, right heart  catheterization will need to be considered.  Chronic HFpEF: This is likely contributing at least to some degree to shortness of breath and leg swelling.  As above, we will repeat an echocardiogram in 2 months.  Sodium restriction encouraged.  Continue current dose of HCTZ as well as blood pressure control with carvedilol  and losartan .  If edema persists, trial of loop diuretic will also need to be considered.  Hypertension: Blood pressure reasonably well-controlled today.  Continue current regimen of carvedilol , hydrochlorothiazide , and losartan .  Hyperlipidemia associated with type 2 diabetes mellitus, coronary artery disease, and aortic atherosclerosis: Though Misty Hebert is not had any ASCVD clinical events, she is known to have aortic atherosclerosis and minimal CAD by coronary CTA in 2021.  She was recently prescribed evolocumab by her PCPs office, given that LDL remained above goal on pravastatin  and ezetimibe .  I will defer ongoing management to Dr. Norris Bee.  Orbit obesity: BMI remains greater than 40 with multiple risk factors.  This is likely contributing some to exertional dyspnea.  I encouraged Misty Hebert to increase her activity as tolerated.    Dispo: Return to clinic in 2 months (shortly after follow-up echocardiogram).  Signed, Sammy Crisp, MD

## 2023-11-18 NOTE — Telephone Encounter (Signed)
 Called pt to schedule post op appt and LVM to schedule with [facility]   Post op  Follow up as scheduled with Vascular Surgery right DVT study in the next couple of weeks per fb

## 2023-11-18 NOTE — Patient Instructions (Signed)
 Medication Instructions:  Your physician recommends that you continue on your current medications as directed. Please refer to the Current Medication list given to you today.   *If you need a refill on your cardiac medications before your next appointment, please call your pharmacy*  Lab Work: No test ordered today   Testing/Procedures: Your physician has requested that you have an echocardiogram in July. Echocardiography is a painless test that uses sound waves to create images of your heart. It provides your doctor with information about the size and shape of your heart and how well your heart's chambers and valves are working.   You may receive an ultrasound enhancing agent through an IV if needed to better visualize your heart during the echo. This procedure takes approximately one hour.  There are no restrictions for this procedure.  This will take place at [address] [facility]) [address], [place] [address]  Please note: We ask at that you not bring children with you during ultrasound (echo/ vascular) testing. Due to room size and safety concerns, children are not allowed in the ultrasound rooms during exams. Our front office staff cannot provide observation of children in our lobby area while testing is being conducted. An adult accompanying a patient to their appointment will only be allowed in the ultrasound room at the discretion of the ultrasound technician under special circumstances. We apologize for any inconvenience.   Follow-Up: At [facility], you and your health needs are our priority.  As part of our continuing mission to provide you with exceptional heart care, our providers are all part of one team.  This team includes your primary Cardiologist (physician) and Advanced Practice Providers or APPs (Physician Assistants and Nurse Practitioners) who all work together to provide you with the care you need, when you need it.  Your next appointment:   2  month(s)  Provider:   You may see Sammy Crisp, MD or one of the following Advanced Practice Providers on your designated Care Team:   Laneta Pintos, NP Gildardo Labrador, PA-C Varney Gentleman, PA-C Cadence Newtown, PA-C Ronald Cockayne, NP Morey Ar, NP    We recommend signing up for the patient portal called "MyChart".  Sign up information is provided on this After Visit Summary.  MyChart is used to connect with patients for Virtual Visits (Telemedicine).  Patients are able to view lab/test results, encounter notes, upcoming appointments, etc.  Non-urgent messages can be sent to your provider as well.   To learn more about what you can do with MyChart, go to [email or web address].

## 2023-11-23 ENCOUNTER — Encounter (INDEPENDENT_AMBULATORY_CARE_PROVIDER_SITE_OTHER): Payer: Self-pay

## 2023-11-25 ENCOUNTER — Other Ambulatory Visit (INDEPENDENT_AMBULATORY_CARE_PROVIDER_SITE_OTHER): Payer: Self-pay | Admitting: Vascular Surgery

## 2023-11-25 DIAGNOSIS — I82411 Acute embolism and thrombosis of right femoral vein: Secondary | ICD-10-CM

## 2023-11-30 ENCOUNTER — Ambulatory Visit (INDEPENDENT_AMBULATORY_CARE_PROVIDER_SITE_OTHER)

## 2023-11-30 ENCOUNTER — Ambulatory Visit (INDEPENDENT_AMBULATORY_CARE_PROVIDER_SITE_OTHER): Admitting: Nurse Practitioner

## 2023-11-30 VITALS — BP 124/79 | HR 74 | Resp 17 | Ht 63.0 in | Wt 233.6 lb

## 2023-11-30 DIAGNOSIS — I82411 Acute embolism and thrombosis of right femoral vein: Secondary | ICD-10-CM

## 2023-11-30 DIAGNOSIS — I2699 Other pulmonary embolism without acute cor pulmonale: Secondary | ICD-10-CM

## 2023-11-30 DIAGNOSIS — I10 Essential (primary) hypertension: Secondary | ICD-10-CM

## 2023-12-01 ENCOUNTER — Encounter (INDEPENDENT_AMBULATORY_CARE_PROVIDER_SITE_OTHER): Payer: Self-pay | Admitting: Nurse Practitioner

## 2023-12-01 NOTE — Progress Notes (Signed)
 Subjective:    Patient ID: Misty Hebert, female    DOB: 02-23-1951, 73 y.o.   MRN: [id number] Chief Complaint  Patient presents with   Venous Insufficiency    Post op  Follow up as scheduled with Vascular Surgery right DVT study in the next couple of weeks per fb    The patient returns today following recent pulmonary thrombectomy on 10/20/2023 as well as right lower extremity thrombectomy on 10/21/2023.  Following her thrombectomy the patient notes that the chest pain is better than it was but she does continue to have shortness of breath with exertion.  She denies any pain in her lower extremity or any extensive swelling.  She initially did have swelling but after some medication changes it has been improved.  She does have some ongoing hip issues and does note some difficulty with walking and pain in her right hip as well.  She is currently on Eliquis  and is tolerating this well.  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  This is her first pulmonary embolism.  She notes that she had a daughter who passed with age of 63 from a previous pulmonary embolus.  Today noninvasive studies show resolution of the DVT in the right lower extremity.    Review of Systems  Respiratory:  Positive for shortness of breath.   Cardiovascular:  Negative for leg swelling.  All other systems reviewed and are negative.      Objective:    Physical Exam Vitals reviewed.  HENT:     Head: Normocephalic.  Cardiovascular:     Rate and Rhythm: Normal rate.     Pulses: Normal pulses.  Pulmonary:     Effort: Pulmonary effort is normal.  Skin:    General: Skin is warm and dry.  Neurological:     Mental Status: She is alert and oriented to person, place, and time.  Psychiatric:        Mood and Affect: Mood normal.        Behavior: Behavior normal.        Thought Content: Thought content normal.        Judgment: Judgment normal.     BP 124/79 (BP Location: Left Arm, Patient Position: Sitting, Cuff Size:  Large)   Pulse 74   Resp 17   Ht 5\' 3"  (1.6 m)   Wt 233 lb 9.6 oz (106 kg)   LMP  (LMP Unknown)   BMI 41.38 kg/m   Past Medical History:  Diagnosis Date   Actinic keratosis    Arthritis of knee    Benign essential HTN    Chest pain    a. she reports 4 normal heart caths; b. 12/2013 [facility]: nl Ivette Marks); c. 04/2016 MV: apical and ant defect @ rest consistent w/ breast attenuation. No ischemia. EF 55-65%.   Depression    Diverticulitis of intestine w/o perforation or abscess w/o bleeding    DVT (deep venous thrombosis) (HCC)    Dysphagia    Fibromyalgia    High cholesterol    History of colon resection    History of echocardiogram    a. 01/2017 Echo ([facility]): nl LV syst fxn.   Hyperlipidemia    Medial meniscus tear    Migraine    Neuromuscular disorder (HCC)    Obesity    Overactive bladder    Reflux    Subchondral insufficiency fracture of condyle of femur (HCC)    Tension headache     Social History  Socioeconomic History   Marital status: Married    Spouse name: Not on file   Number of children: Not on file   Years of education: Not on file   Highest education level: Not on file  Occupational History   Not on file  Tobacco Use   Smoking status: Never   Smokeless tobacco: Never  Vaping Use   Vaping status: Never Used  Substance and Sexual Activity   Alcohol use: No   Drug use: No   Sexual activity: Not on file  Other Topics Concern   Not on file  Social History Narrative   Not on file   Social Drivers of Health   Financial Resource Strain: Low Risk  (12/27/2022)   Received [facility], [facility]   Overall Financial Resource Strain (CARDIA)    Difficulty of Paying Living Expenses: Not hard at all  Food Insecurity: No Food Insecurity (10/19/2023)   Hunger Vital Sign    Worried About Running Out of Food in the Last Year: Never true    Ran Out of Food in the Last Year: Never true  Transportation Needs: No  Transportation Needs (10/19/2023)   PRAPARE - Administrator, Civil Service (Medical): No    Lack of Transportation (Non-Medical): No  Physical Activity: Not on file  Stress: Not on file  Social Connections: Moderately Isolated (10/19/2023)   Social Connection and Isolation Panel [NHANES]    Frequency of Communication with Friends and Family: More than three times a week    Frequency of Social Gatherings with Friends and Family: More than three times a week    Attends Religious Services: Never    Database administrator or Organizations: No    Attends Banker Meetings: Never    Marital Status: Married  Catering manager Violence: Not At Risk (10/19/2023)   Humiliation, Afraid, Rape, and Kick questionnaire    Fear of Current or Ex-Partner: No    Emotionally Abused: No    Physically Abused: No    Sexually Abused: No    Past Surgical History:  Procedure Laterality Date   ABDOMINAL HYSTERECTOMY     BREAST BIOPSY Right    CYST REMOVED-16-21YRS AGO-NEG   BREAST BIOPSY Left 03/24/2021   stereo bx of calcs UOQ anterior #1, coil marker, benign   BREAST BIOPSY Left 03/24/2021   stereo bx calcs UOQ posterior #2, x marker, benign   CARDIAC CATHETERIZATION  1995   [facility]   CARDIAC CAT[facility]ATION  2001   [facility]   CARDIAC CATHETERIZATION  2006   [facility]   CARDIAC CATHETERIZATION     [facility] with Dr. Bary Likes    COLON RESECTION     ESOPHAGOGASTRODUODENOSCOPY (EGD) WITH PROPOFOL  N/A 02/06/2022   Procedure: ESOPHAGOGASTRODUODENOSCOPY (EGD) WITH PROPOFOL ;  Surgeon: Shane Darling, MD;  Location: [facility] ENDOSCOPY;  Service: Endoscopy;  Laterality: N/A;   FOOT SURGERY Left 01/10/2016   03/06/16 second foot surgery   KNEE SURGERY     PERIPHERAL VASCULAR THROMBECTOMY Right 10/21/2023   Procedure: PERIPHERAL VASCULAR THROMBECTOMY;  Surgeon: Celso College, MD;  Location: [facility] INVASIVE CV LAB;  Service: Cardiovascular;  Laterality: Right;   PULMONARY  THROMBECTOMY Bilateral 10/20/2023   Procedure: PULMONARY THROMBECTOMY;  Surgeon: Celso College, MD;  Location: [facility] INVASIVE CV LAB;  Service: Cardiovascular;  Laterality: Bilateral;   REVISION TOTAL HIP ARTHROPLASTY     June 2019   SHOULDER SURGERY  Family History  Problem Relation Age of Onset   Breast cancer Maternal Aunt 40   Heart attack Mother    Heart disease Mother        pacemaker/ICD   Heart attack Father    Stroke Father    Lung cancer Father    Heart disease Brother        stents in early 50's    Allergies  Allergen Reactions   Chlorpromazine Anaphylaxis and Other (See Comments)    Other Reaction: LOC Other Reaction: LOC Other Reaction: LOC Other Reaction: LOC    Metoclopramide Other (See Comments) and Anaphylaxis    Other Reaction: RESTLESS Twitching , cant sit still  Other Reaction: RESTLESS Other Reaction: RESTLESS Other Reaction: RESTLESS    Sulfamethizole Nausea And Vomiting and Other (See Comments)   Amlodipine      BLLE edema   Other Other (See Comments)    Torazine gave her syncope Upset stomach Upset stomach    Celecoxib Nausea Only and Anxiety   Meloxicam  Other (See Comments) and Diarrhea       Latest Ref Rng & Units 10/22/2023   12:20 AM 10/21/2023    4:05 AM 10/20/2023    2:34 AM  CBC  WBC 4.0 - 10.5 K/uL 7.7  7.6  8.8   Hemoglobin 12.0 - 15.0 g/dL 16.1  09.6  04.5   Hematocrit 36.0 - 46.0 % 33.4  34.5  38.9   Platelets 150 - 400 K/uL 155  162  162        CMP     Component Value Date/Time   NA 133 (L) 10/22/2023 0020   NA 141 05/07/2021 1018   NA 137 10/26/2014 2240   K 3.9 10/22/2023 0020   K 4.0 10/26/2014 2240   CL 102 10/22/2023 0020   CL 102 10/26/2014 2240   CO2 22 10/22/2023 0020   CO2 25 10/26/2014 2240   GLUCOSE 129 (H) 10/22/2023 0020   GLUCOSE 174 (H) 10/26/2014 2240   BUN 13 10/22/2023 0020   BUN 11 05/07/2021 1018   BUN 13 10/26/2014 2240   CREATININE 0.90 10/22/2023 0020   CREATININE 1.02 (H)  10/26/2014 2240   CALCIUM 8.5 (L) 10/22/2023 0020   CALCIUM 9.1 10/26/2014 2240   PROT 6.4 (L) 10/18/2023 2219   PROT 6.1 09/09/2020 1513   PROT 7.4 10/26/2014 2240   ALBUMIN 3.6 10/18/2023 2219   ALBUMIN 4.0 09/09/2020 1513   ALBUMIN 4.2 10/26/2014 2240   AST 23 10/18/2023 2219   AST 27 10/26/2014 2240   ALT 27 10/18/2023 2219   ALT 25 10/26/2014 2240   ALKPHOS 77 10/18/2023 2219   ALKPHOS 127 (H) 10/26/2014 2240   BILITOT 1.1 10/18/2023 2219   BILITOT 0.5 09/09/2020 1513   BILITOT 0.4 10/26/2014 2240   EGFR 76 05/07/2021 1018   GFRNONAA >60 10/22/2023 0020   GFRNONAA 58 (L) 10/26/2014 2240     No results found.     Assessment & Plan:   1. Bilateral pulmonary embolism (HCC) (Primary) The patient does continue to have some shortness of breath although it is improved from her initial settings.  I discussed with the patient that even after pulmonary thrombectomy some shortness of breath can remain as the small vessels heal and are cleared of thrombus that were unable to be reached by thrombectomy.  She will continue Eliquis  twice daily.  Based on discussion with the patient, she can plan on continuing with anticoagulation on a lifetime basis.  2. Acute  deep vein thrombosis (DVT) of femoral vein of right lower extremity (HCC) Prior to this episode the patient has previously had multiple DVTs.  They have not all been clearly provoked.  Her daughter also died at a fairly young age from a pulmonary embolism as well.  Due to this I think it would be wise for the patient undergo a hypercoagulable workup to determine the presence of any possible underlying hypercoagulability.  It is recommended that the patient remain on lifelong anticoagulation from a vascular perspective.    In addition we discussed possible postphlebitic symptoms.  We discussed the use of medical grade compression stockings would be helpful if she continues to experience any lower extremity edema. - Ambulatory referral  to Hematology / Oncology  3. Essential hypertension Continue antihypertensive medications as already ordered, these medications have been reviewed and there are no changes at this time.   Current Outpatient Medications on File Prior to Visit  Medication Sig Dispense Refill   APIXABAN  (ELIQUIS ) VTE STARTER PACK (10MG  AND 5MG ) Take as directed on package: start with two-5mg  tablets twice daily for 7 days. On day 8, switch to one-5mg  tablet twice daily. 74 each 0   carvedilol  (COREG ) 6.25 MG tablet Take 6.25 mg by mouth 2 (two) times daily with a meal.     cyanocobalamin (VITAMIN B12) 1000 MCG/ML injection Inject 1,000 mcg into the muscle every 30 (thirty) days.     dicyclomine  (BENTYL ) 10 MG capsule Take 10 mg by mouth 2 (two) times daily as needed.     ezetimibe  (ZETIA ) 10 MG tablet Take 10 mg by mouth daily.     [Paused] hydrochlorothiazide  (HYDRODIURIL ) 25 MG tablet Take 1 tablet (25 mg total) by mouth daily. 90 tablet 3   [Paused] losartan  (COZAAR ) 100 MG tablet Take 50 mg by mouth daily.     nitroGLYCERIN  (NITROSTAT ) 0.4 MG SL tablet Place 1 tablet (0.4 mg total) under the tongue every 5 (five) minutes as needed for chest pain (maximum of 3 doses.). 25 tablet 2   omeprazole (PRILOSEC) 40 MG capsule Take 20 mg by mouth daily.     pravastatin  (PRAVACHOL ) 80 MG tablet Take 1 tablet (80 mg total) by mouth daily. 90 tablet 3   REPATHA SURECLICK 140 MG/ML SOAJ Inject 140 mg into the skin every 14 (fourteen) days.     Semaglutide,0.25 or 0.5MG /DOS, (OZEMPIC, 0.25 OR 0.5 MG/DOSE,) 2 MG/1.5ML SOPN Inject 0.5 mg into the skin once a week.     fluticasone  (FLONASE ) 50 MCG/ACT nasal spray Place 2 sprays into both nostrils daily. (Patient not taking: Reported on 04/19/2023) 16 mL 0   No current facility-administered medications on file prior to visit.    There are no Patient Instructions on file for this visit. No follow-ups on file.   Sarie Stall E Maryn Freelove, NP

## 2023-12-13 ENCOUNTER — Inpatient Hospital Stay

## 2023-12-13 ENCOUNTER — Encounter: Payer: Self-pay | Admitting: Oncology

## 2023-12-13 ENCOUNTER — Inpatient Hospital Stay: Attending: Oncology | Admitting: Oncology

## 2023-12-13 VITALS — BP 158/78 | HR 66 | Temp 97.9°F | Resp 20 | Ht 63.0 in | Wt 226.5 lb

## 2023-12-13 DIAGNOSIS — Z86718 Personal history of other venous thrombosis and embolism: Secondary | ICD-10-CM | POA: Insufficient documentation

## 2023-12-13 DIAGNOSIS — Z7901 Long term (current) use of anticoagulants: Secondary | ICD-10-CM | POA: Insufficient documentation

## 2023-12-13 DIAGNOSIS — Z09 Encounter for follow-up examination after completed treatment for conditions other than malignant neoplasm: Secondary | ICD-10-CM

## 2023-12-13 DIAGNOSIS — Z79899 Other long term (current) drug therapy: Secondary | ICD-10-CM | POA: Diagnosis not present

## 2023-12-13 DIAGNOSIS — Z801 Family history of malignant neoplasm of trachea, bronchus and lung: Secondary | ICD-10-CM | POA: Insufficient documentation

## 2023-12-13 DIAGNOSIS — Z8249 Family history of ischemic heart disease and other diseases of the circulatory system: Secondary | ICD-10-CM | POA: Insufficient documentation

## 2023-12-13 DIAGNOSIS — Z86711 Personal history of pulmonary embolism: Secondary | ICD-10-CM | POA: Insufficient documentation

## 2023-12-13 DIAGNOSIS — Z803 Family history of malignant neoplasm of breast: Secondary | ICD-10-CM | POA: Insufficient documentation

## 2023-12-13 DIAGNOSIS — Z806 Family history of leukemia: Secondary | ICD-10-CM | POA: Diagnosis not present

## 2023-12-13 DIAGNOSIS — R768 Other specified abnormal immunological findings in serum: Secondary | ICD-10-CM | POA: Insufficient documentation

## 2023-12-13 NOTE — Progress Notes (Signed)
 Patient reports she is very tired and weak.

## 2023-12-13 NOTE — Progress Notes (Signed)
 Hematology/Oncology Consult note [facility] [phone] [phone]  Patient Care Team: Arno Lapidus, MD as PCP - General (Family Medicine) End, Veryl Gottron, MD as PCP - Cardiology (Cardiology)   Name of the patient: Misty Hebert  [id number]  10/12/1950    Reason for referral-   Recurrent DVT   Referring physician-Dr. Arno Lapidus  Date of visit: 12/13/23   History of presenting illness-patient is a 73 year old female who has a past history of recurrent DVT.  States that she has had her first episode of DVT sometime in 1995.  She was also found to have DVT in the distal superficial femoral vein and popliteal vein in the right leg back in January 2014.  She also had another episode of DVT sometime in between 1995 and 2014.  In April 2025 patient went to the hospital with symptoms of worsening shortness of breath and pain on deep inspiration.  CT angio chest showed  Extensive pulmonary emboli bilaterally and patient underwent thrombectomy for the same.    Lower extremity ultrasound showed normal  Occlusive DVT in the right common femoral popliteal and calf veins.  Patient is presently on Eliquis  and is tolerating it well.  Prior to her recent episode of DVT and PE patient did not have any hospitalizations or surgeries.  No prolonged episodes of immobilization.  Her daughter died of a PE in her 37s.  ECOG PS- 1  Pain scale- 0   Review of systems- Review of Systems  Constitutional:  Negative for chills, fever, malaise/fatigue and weight loss.  HENT:  Negative for congestion, ear discharge and nosebleeds.   Eyes:  Negative for blurred vision.  Respiratory:  Negative for cough, hemoptysis, sputum production, shortness of breath and wheezing.   Cardiovascular:  Negative for chest pain, palpitations, orthopnea and claudication.  Gastrointestinal:  Negative for abdominal pain, blood in stool, constipation, diarrhea, heartburn, melena,  nausea and vomiting.  Genitourinary:  Negative for dysuria, flank pain, frequency, hematuria and urgency.  Musculoskeletal:  Negative for back pain, joint pain and myalgias.  Skin:  Negative for rash.  Neurological:  Negative for dizziness, tingling, focal weakness, seizures, weakness and headaches.  Endo/Heme/Allergies:  Does not bruise/bleed easily.  Psychiatric/Behavioral:  Negative for depression and suicidal ideas. The patient does not have insomnia.     Allergies  Allergen Reactions   Chlorpromazine Anaphylaxis and Other (See Comments)    Other Reaction: LOC Other Reaction: LOC Other Reaction: LOC Other Reaction: LOC    Metoclopramide Other (See Comments) and Anaphylaxis    Other Reaction: RESTLESS Twitching , cant sit still  Other Reaction: RESTLESS Other Reaction: RESTLESS Other Reaction: RESTLESS    Sulfamethizole Nausea And Vomiting and Other (See Comments)   Amlodipine      BLLE edema   Other Other (See Comments)    Torazine gave her syncope Upset stomach Upset stomach    Celecoxib Nausea Only and Anxiety   Meloxicam  Other (See Comments) and Diarrhea    Patient Active Problem List   Diagnosis Date Noted   Aortic atherosclerosis (HCC) 11/18/2023   Bilateral pulmonary embolism (HCC) 10/18/2023   CAD (coronary artery disease) 10/18/2023   HLD (hyperlipidemia) 10/18/2023   Coronary artery disease involving native coronary artery of native heart with angina pectoris (HCC) 04/30/2022   Chronic heart failure with preserved ejection fraction (HFpEF) (HCC) 01/17/2020   Dermatochalasis of both upper eyelids 08/08/2018   Ptosis, both eyelids 08/08/2018   Visual field defect 08/08/2018   Leukocytosis 06/07/2018   Low  back pain 06/07/2018   Morbid obesity (HCC) 06/07/2018   Osteoarthritis of right hip 06/07/2018   Primary localized osteoarthritis of pelvic region and thigh 06/07/2018   On long term drug therapy 02/24/2018   History of total hip arthroplasty  01/04/2018   Carpal tunnel syndrome 11/17/2017   Mild persistent asthma without complication 11/17/2017   Dyspnea 11/23/2016   Herpes simplex infection 11/23/2016   Sore throat 11/23/2016   Atypical chest pain 07/30/2016   Essential hypertension 07/30/2016   Morbid obesity with BMI of 40.0-44.9, adult (HCC) 07/30/2016   Pain in right knee 07/29/2016   Gastroesophageal reflux disease 04/23/2016   History of esophageal ulcer 04/23/2016   Arthritis of knee 10/04/2013   S/P colon resection 07/25/2013   Diverticulitis of intestine w/o perforation or abscess w/o bleeding 07/26/2012   DVT (deep venous thrombosis) (HCC) 07/26/2012   Neuromuscular disorder (HCC) 04/12/2012   Hip pain 01/14/2012   Trochanteric bursitis of left hip 12/22/2011   Depression 04/06/2011   Fibromyalgia 04/06/2011   Hyperlipidemia associated with type 2 diabetes mellitus (HCC) 04/06/2011   Migraine 04/06/2011   Overactive bladder 04/06/2011   Tension type headache 04/06/2011     Past Medical History:  Diagnosis Date   Actinic keratosis    Arthritis of knee    Benign essential HTN    Chest pain    a. she reports 4 normal heart caths; b. 12/2013 [facility]: nl Ivette Marks); c. 04/2016 MV: apical and ant defect @ rest consistent w/ breast attenuation. No ischemia. EF 55-65%.   Depression    Diverticulitis of intestine w/o perforation or abscess w/o bleeding    DVT (deep venous thrombosis) (HCC)    Dysphagia    Fibromyalgia    High cholesterol    History of colon resection    History of echocardiogram    a. 01/2017 Echo ([facility]): nl LV syst fxn.   Hyperlipidemia    Medial meniscus tear    Migraine    Neuromuscular disorder (HCC)    Obesity    Overactive bladder    Reflux    Subchondral insufficiency fracture of condyle of femur (HCC)    Tension headache      Past Surgical History:  Procedure Laterality Date   ABDOMINAL HYSTERECTOMY     BREAST BIOPSY Right    CYST REMOVED-16-34YRS AGO-NEG   BREAST  BIOPSY Left 03/24/2021   stereo bx of calcs UOQ anterior #1, coil marker, benign   BREAST BIOPSY Left 03/24/2021   stereo bx calcs UOQ posterior #2, x marker, benign   CARDIAC CATHETERIZATION  1995   [facility]   CARDIAC [facility]HE[facility]IZATION  2001   [facility]   CARDIAC CATHETERIZATION  2006   [facility]   CARDIAC CATHETERIZATION     [facility] with Dr. Bary Likes    COLON RESECTION     ESOPHAGOGASTRODUODENOSCOPY (EGD) WITH PROPOFOL  N/A 02/06/2022   Procedure: ESOPHAGOGASTRODUODENOSCOPY (EGD) WITH PROPOFOL ;  Surgeon: Shane Darling, MD;  Location: [facility] ENDOSCOPY;  Service: Endoscopy;  Laterality: N/A;   FOOT SURGERY Left 01/10/2016   03/06/16 second foot surgery   KNEE SURGERY     PERIPHERAL VASCULAR THROMBECTOMY Right 10/21/2023   Procedure: PERIPHERAL VASCULAR THROMBECTOMY;  Surgeon: Celso College, MD;  Location: [facility] INVASIVE CV LAB;  Service: Cardiovascular;  Laterality: Right;   PULMONARY THROMBECTOMY Bilateral 10/20/2023   Procedure: PULMONARY THROMBECTOMY;  Surgeon: Celso College, MD;  Location: [facility] INVASIVE CV LAB;  Service: Cardiovascular;  Laterality: Bilateral;   REVISION TOTAL  HIP ARTHROPLASTY     June 2019   SHOULDER SURGERY      Social History   Socioeconomic History   Marital status: Married    Spouse name: Not on file   Number of children: Not on file   Years of education: Not on file   Highest education level: Not on file  Occupational History   Not on file  Tobacco Use   Smoking status: Never   Smokeless tobacco: Never  Vaping Use   Vaping status: Never Used  Substance and Sexual Activity   Alcohol use: No   Drug use: No   Sexual activity: Not Currently  Other Topics Concern   Not on file  Social History Narrative   Not on file   Social Drivers of Health   Financial Resource Strain: Low Risk  (12/27/2022)   Received from [facility]   Overall Financial Resource Strain (CARDIA)    Difficulty of  Paying Living Expenses: Not hard at all  Food Insecurity: No Food Insecurity (12/13/2023)   Hunger Vital Sign    Worried About Running Out of Food in the Last Year: Never true    Ran Out of Food in the Last Year: Never true  Transportation Needs: No Transportation Needs (12/13/2023)   PRAPARE - Administrator, Civil Service (Medical): No    Lack of Transportation (Non-Medical): No  Physical Activity: Not on file  Stress: Not on file  Social Connections: Moderately Isolated (10/19/2023)   Social Connection and Isolation Panel [NHANES]    Frequency of Communication with Friends and Family: More than three times a week    Frequency of Social Gatherings with Friends and Family: More than three times a week    Attends Religious Services: Never    Database administrator or Organizations: No    Attends Banker Meetings: Never    Marital Status: Married  Catering manager Violence: Not At Risk (12/13/2023)   Humiliation, Afraid, Rape, and Kick questionnaire    Fear of Current or Ex-Partner: No    Emotionally Abused: No    Physically Abused: No    Sexually Abused: No     Family History  Problem Relation Age of Onset   Heart attack Mother    Heart disease Mother        pacemaker/ICD   Heart attack Father    Stroke Father    Lung cancer Father    Heart disease Brother        stents in early 50's   Breast cancer Maternal Aunt 76   Leukemia Paternal Grandmother      Current Outpatient Medications:    APIXABAN  (ELIQUIS ) VTE STARTER PACK (10MG  AND 5MG ), Take as directed on package: start with two-5mg  tablets twice daily for 7 days. On day 8, switch to one-5mg  tablet twice daily., Disp: 74 each, Rfl: 0   carvedilol  (COREG ) 6.25 MG tablet, Take 6.25 mg by mouth 2 (two) times daily with a meal., Disp: , Rfl:    cyanocobalamin (VITAMIN B12) 1000 MCG/ML injection, Inject 1,000 mcg into the muscle every 30 (thirty) days., Disp: , Rfl:    dicyclomine  (BENTYL ) 10 MG capsule,  Take 10 mg by mouth 2 (two) times daily as needed., Disp: , Rfl:    ezetimibe  (ZETIA ) 10 MG tablet, Take 10 mg by mouth daily., Disp: , Rfl:    fluticasone  (FLONASE ) 50 MCG/ACT nasal spray, Place 2 sprays into both nostrils daily. (  Patient not taking: Reported on 04/19/2023), Disp: 16 mL, Rfl: 0   [Paused] hydrochlorothiazide  (HYDRODIURIL ) 25 MG tablet, Take 1 tablet (25 mg total) by mouth daily., Disp: 90 tablet, Rfl: 3   [Paused] losartan  (COZAAR ) 100 MG tablet, Take 50 mg by mouth daily., Disp: , Rfl:    nitroGLYCERIN  (NITROSTAT ) 0.4 MG SL tablet, Place 1 tablet (0.4 mg total) under the tongue every 5 (five) minutes as needed for chest pain (maximum of 3 doses.)., Disp: 25 tablet, Rfl: 2   omeprazole (PRILOSEC) 40 MG capsule, Take 20 mg by mouth daily., Disp: , Rfl:    pravastatin  (PRAVACHOL ) 80 MG tablet, Take 1 tablet (80 mg total) by mouth daily., Disp: 90 tablet, Rfl: 3   REPATHA SURECLICK 140 MG/ML SOAJ, Inject 140 mg into the skin every 14 (fourteen) days., Disp: , Rfl:    Semaglutide,0.25 or 0.5MG /DOS, (OZEMPIC, 0.25 OR 0.5 MG/DOSE,) 2 MG/1.5ML SOPN, Inject 0.5 mg into the skin once a week., Disp: , Rfl:    VALIUM 10 MG tablet, Take 1 tablet by oral route 1 hour prior to the procedure (Patient not taking: Reported on 12/13/2023), Disp: , Rfl:    Physical exam:  Vitals:   12/13/23 1322  BP: (!) 158/78  Pulse: 66  Resp: 20  Temp: 97.9 F (36.6 C)  TempSrc: Tympanic  SpO2: 96%  Weight: 226 lb 8 oz (102.7 kg)  Height: 5\' 3"  (1.6 m)   Physical Exam Cardiovascular:     Rate and Rhythm: Normal rate and regular rhythm.     Heart sounds: Normal heart sounds.  Pulmonary:     Effort: Pulmonary effort is normal.     Breath sounds: Normal breath sounds.  Skin:    General: Skin is warm and dry.  Neurological:     Mental Status: She is alert and oriented to person, place, and time.           Latest Ref Rng & Units 10/22/2023   12:20 AM  CMP  Glucose 70 - 99 mg/dL 409   BUN 8 -  23 mg/dL 13   Creatinine 8.11 - 1.00 mg/dL 9.14   Sodium 782 - 956 mmol/L 133   Potassium 3.5 - 5.1 mmol/L 3.9   Chloride 98 - 111 mmol/L 102   CO2 22 - 32 mmol/L 22   Calcium 8.9 - 10.3 mg/dL 8.5       Latest Ref Rng & Units 10/22/2023   12:20 AM  CBC  WBC 4.0 - 10.5 K/uL 7.7   Hemoglobin 12.0 - 15.0 g/dL 21.3   Hematocrit 08.6 - 46.0 % 33.4   Platelets 150 - 400 K/uL 155     No images are attached to the encounter.  VAS US  LOWER EXTREMITY VENOUS (DVT) Result Date: 12/02/2023  Lower Venous DVT Study Patient Name:  LYNNETTA TOM [facility]  Date of Exam:   11/30/2023 Medical Rec #: [id number]        Accession #:    [id number] Date of Birth: Mar 16, 1951        Patient Gender: F Patient Age:   16 years Exam Location:  [facility] Procedure:      VAS US  LOWER EXTREMITY VENOUS (DVT) Referring Phys: Mikki Alexander --------------------------------------------------------------------------------  Indications: Post-op.  Risk Factors: Confirmed PE Surgery 10/21/2023 RIght SFA, Pop and CFA thrombectomy. Anticoagulation: Eliquis . Performing Technologist: Oneta Bilberry RVT  Examination Guidelines: A complete evaluation includes B-mode imaging, spectral Doppler, color Doppler, and power Doppler as needed of all accessible portions  of each vessel. Bilateral testing is considered an integral part of a complete examination. Limited examinations for reoccurring indications may be performed as noted. The reflux portion of the exam is performed with the patient in reverse Trendelenburg.  +---------+---------------+---------+-----------+----------+--------------+ RIGHT    CompressibilityPhasicitySpontaneityPropertiesThrombus Aging +---------+---------------+---------+-----------+----------+--------------+ CFV      Full           Yes      Yes                                 +---------+---------------+---------+-----------+----------+--------------+ SFJ      Full                    Yes                                  +---------+---------------+---------+-----------+----------+--------------+ FV Prox  Full           Yes      Yes                                 +---------+---------------+---------+-----------+----------+--------------+ FV Mid   Full           Yes      Yes                                 +---------+---------------+---------+-----------+----------+--------------+ FV DistalFull           Yes      Yes                                 +---------+---------------+---------+-----------+----------+--------------+ PFV      Full                    Yes                                 +---------+---------------+---------+-----------+----------+--------------+ POP      Full           Yes      Yes                                 +---------+---------------+---------+-----------+----------+--------------+ PTV      Full                                                        +---------+---------------+---------+-----------+----------+--------------+ PERO     Full                                                        +---------+---------------+---------+-----------+----------+--------------+ GSV      Full                    Yes                                 +---------+---------------+---------+-----------+----------+--------------+  SSV      Full                                                        +---------+---------------+---------+-----------+----------+--------------+   +----+---------------+---------+-----------+----------+--------------+ LEFTCompressibilityPhasicitySpontaneityPropertiesThrombus Aging +----+---------------+---------+-----------+----------+--------------+ CFV Full           Yes      Yes                                 +----+---------------+---------+-----------+----------+--------------+    Summary: RIGHT: - There is no evidence of deep vein thrombosis in the lower extremity. - There is no evidence of superficial  venous thrombosis.  - No cystic structure found in the popliteal fossa.  LEFT: - No evidence of common femoral vein obstruction.   *See table(s) above for measurements and observations. Electronically signed by Mikki Alexander MD on 12/02/2023 at 10:06:19 AM.    Final     Assessment and plan- Patient is a 73 y.o. female referred for history of recurrent DVT and PE  Based on history this appears to be patient's fourth episode of DVT and possibly her first episode of PE.  This episode was unprovoked.  She has had 3 prior episodes between 1995 and 2014.  Given that this is an unprovoked recurrent episode she will need to stay on lifelong anticoagulation unless there is overt concern for bleeding.  She will stay on Eliquis  at this time.  I will obtain Hypercoagulable workup at this time including factor V Leiden prothrombin gene mutation, protein C protein S Antithrombin III levels as well as testing for antiphospholipid antibody syndrome.  Hypercoagulable workup will not change management unless she has evidence of APS in which case we will consider switching her to Coumadin.  I will see her back in 2 weeks time to discuss the results of today's blood work   Thank you for this kind referral and the opportunity to participate in the care of this patient   Visit Diagnosis 1. History of pulmonary embolism   2. History of recurrent deep vein thrombosis (DVT)     Dr. Seretha Dance, MD, MPH [facility] [phone] 12/13/2023

## 2023-12-14 ENCOUNTER — Encounter: Payer: Self-pay | Admitting: Dermatology

## 2023-12-14 ENCOUNTER — Ambulatory Visit: Admitting: Dermatology

## 2023-12-14 DIAGNOSIS — L918 Other hypertrophic disorders of the skin: Secondary | ICD-10-CM | POA: Diagnosis not present

## 2023-12-14 DIAGNOSIS — L814 Other melanin hyperpigmentation: Secondary | ICD-10-CM

## 2023-12-14 DIAGNOSIS — L821 Other seborrheic keratosis: Secondary | ICD-10-CM

## 2023-12-14 DIAGNOSIS — D229 Melanocytic nevi, unspecified: Secondary | ICD-10-CM

## 2023-12-14 DIAGNOSIS — Z79899 Other long term (current) drug therapy: Secondary | ICD-10-CM

## 2023-12-14 DIAGNOSIS — Z1283 Encounter for screening for malignant neoplasm of skin: Secondary | ICD-10-CM | POA: Diagnosis not present

## 2023-12-14 DIAGNOSIS — D692 Other nonthrombocytopenic purpura: Secondary | ICD-10-CM

## 2023-12-14 DIAGNOSIS — Z7189 Other specified counseling: Secondary | ICD-10-CM

## 2023-12-14 DIAGNOSIS — L659 Nonscarring hair loss, unspecified: Secondary | ICD-10-CM

## 2023-12-14 DIAGNOSIS — L578 Other skin changes due to chronic exposure to nonionizing radiation: Secondary | ICD-10-CM | POA: Diagnosis not present

## 2023-12-14 DIAGNOSIS — L820 Inflamed seborrheic keratosis: Secondary | ICD-10-CM | POA: Diagnosis not present

## 2023-12-14 DIAGNOSIS — Z86711 Personal history of pulmonary embolism: Secondary | ICD-10-CM

## 2023-12-14 DIAGNOSIS — D1801 Hemangioma of skin and subcutaneous tissue: Secondary | ICD-10-CM

## 2023-12-14 DIAGNOSIS — W908XXA Exposure to other nonionizing radiation, initial encounter: Secondary | ICD-10-CM

## 2023-12-14 DIAGNOSIS — L570 Actinic keratosis: Secondary | ICD-10-CM | POA: Diagnosis not present

## 2023-12-14 DIAGNOSIS — I781 Nevus, non-neoplastic: Secondary | ICD-10-CM

## 2023-12-14 LAB — CARDIOLIPIN ANTIBODIES, IGG, IGM, IGA
Anticardiolipin IgA: <9 U/mL (ref 0–11)
Anticardiolipin IgG: <9 GPL U/mL (ref 0–14)
Anticardiolipin IgM: 14 [MPL'U]/mL — ABNORMAL HIGH (ref 0–12)

## 2023-12-14 LAB — BETA-2-GLYCOPROTEIN I ABS, IGG/M/A
Beta-2 Glyco I IgG: <9 GPI IgG units (ref 0–20)
Beta-2-Glycoprotein I IgA: <9 GPI IgA units (ref 0–25)
Beta-2-Glycoprotein I IgM: <9 GPI IgM units (ref 0–32)

## 2023-12-14 LAB — ANTITHROMBIN III: AntiThromb III Func: 125 % — ABNORMAL HIGH (ref 75–120)

## 2023-12-14 LAB — PROTEIN C, TOTAL: Protein C, Total: 157 % — ABNORMAL HIGH (ref 60–150)

## 2023-12-14 MED ORDER — BIMATOPROST 0.01 % OP SOLN: OPHTHALMIC | 6 refills | Status: AC

## 2023-12-14 NOTE — Patient Instructions (Addendum)

## 2023-12-14 NOTE — Progress Notes (Signed)
 Follow-Up Visit   Subjective  Misty Hebert is a 73 y.o. female who presents for the following: Skin Cancer Screening and Full Body Skin Exam, hx of precancers  The patient presents for Total-Body Skin Exam (TBSE) for skin cancer screening and mole check. The patient has spots, moles and lesions to be evaluated, some may be new or changing and the patient may have concern these could be cancer.  The following portions of the chart were reviewed this encounter and updated as appropriate: medications, allergies, medical history  Review of Systems:  No other skin or systemic complaints except as noted in HPI or Assessment and Plan.  Objective  Well appearing patient in no apparent distress; mood and affect are within normal limits.  A full examination was performed including scalp, head, eyes, ears, nose, lips, neck, chest, axillae, abdomen, back, buttocks, bilateral upper extremities, bilateral lower extremities, hands, feet, fingers, toes, fingernails, and toenails. All findings within normal limits unless otherwise noted below.   Relevant physical exam findings are noted in the Assessment and Plan.  left axilla x 2, right shoulder x 2, right forehead x 2 (6) Stuck-on, waxy, tan-brown papule or plaque --Discussed benign etiology and prognosis.  right cheek Erythematous thin papules/macules with gritty scale.       Assessment & Plan   SKIN CANCER SCREENING PERFORMED TODAY.  LENTIGINES, SEBORRHEIC KERATOSES, HEMANGIOMAS - Benign normal skin lesions - Benign-appearing - Call for any changes  MELANOCYTIC NEVI - Tan-brown and/or pink-flesh-colored symmetric macules and papules - Benign appearing on exam today - Observation - Call clinic for new or changing moles - Recommend daily use of broad spectrum spf 30+ sunscreen to sun-exposed areas.   INFLAMED SEBORRHEIC KERATOSIS (6) left axilla x 2, right shoulder x 2, right forehead x 2 (6) Symptomatic, irritating, patient would  like treated.  Destruction of lesion - left axilla x 2, right shoulder x 2, right forehead x 2 (6) Complexity: simple   Destruction method: cryotherapy   Informed consent: discussed and consent obtained   Timeout:  patient name, date of birth, surgical site, and procedure verified Lesion destroyed using liquid nitrogen: Yes   Region frozen until ice ball extended beyond lesion: Yes   Outcome: patient tolerated procedure well with no complications   Post-procedure details: wound care instructions given   AK (ACTINIC KERATOSIS) right cheek ACTINIC DAMAGE - chronic, secondary to cumulative UV radiation exposure/sun exposure over time - diffuse scaly erythematous macules with underlying dyspigmentation - Recommend daily broad spectrum sunscreen SPF 30+ to sun-exposed areas, reapply every 2 hours as needed.  - Recommend staying in the shade or wearing long sleeves, sun glasses (UVA+UVB protection) and wide brim hats (4-inch brim around the entire circumference of the hat). - Call for new or changing lesions.  Destruction of lesion - right cheek Complexity: simple   Destruction method: cryotherapy   Informed consent: discussed and consent obtained   Timeout:  patient name, date of birth, surgical site, and procedure verified Lesion destroyed using liquid nitrogen: Yes   Region frozen until ice ball extended beyond lesion: Yes   Outcome: patient tolerated procedure well with no complications   Post-procedure details: wound care instructions given   SKIN CANCER SCREENING   ACTINIC SKIN DAMAGE   LENTIGO   MELANOCYTIC NEVUS, UNSPECIFIED LOCATION   TELANGIECTASIA   SKIN TAGS, MULTIPLE ACQUIRED    ACROCHORDONS (Skin Tags) - Removal desired by patient due to symptoms- irritation - Fleshy, skin-colored pedunculated papules - Benign appearing.  -  Patient desires removal. Reviewed that this is not covered by insurance and they will be charged a cosmetic fee for removal. Patient  signed non-covered consent.  - Prior to procedure, discussed risks of blister formation, small wound, skin dyspigmentation, or rare scar following cryotherapy.  Destruction Procedure Note Destruction method: cryotherapy   Informed consent: discussed and consent obtained   Lesion destroyed using liquid nitrogen: Yes   Outcome: patient tolerated procedure well with no complications   Post-procedure details: wound care instructions given   Locations: left axilla, right axilla, right neck   # of Lesions Treated: 17 Prior to procedure, discussed risks of blister formation, small wound, skin dyspigmentation, or rare scar following cryotherapy. Recommend Vaseline ointment to treated areas while healing.   TELANGIECTASIA Exam: dilated blood vessel(s) Treatment Plan: Benign appearing on exam Call for changes   Purpura - Chronic; persistent and recurrent.  Treatable, but not curable. 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   Return in about 1 year (around 12/13/2024) for TBSE, hx of AKs .  IClara Crisp, CMA, am acting as scribe for Celine Collard, MD .   Documentation: I have reviewed the above documentation for accuracy and completeness, and I agree with the above.  Celine Collard, MD

## 2023-12-16 ENCOUNTER — Telehealth: Payer: Self-pay

## 2023-12-16 LAB — LUPUS ANTICOAGULANT
DRVVT: 50.4 s — ABNORMAL HIGH (ref 0.0–47.0)
PTT Lupus Anticoagulant: 25.8 s (ref 0.0–43.5)
Thrombin Time: 25.9 s — ABNORMAL HIGH (ref 0.0–23.0)
dPT Confirm Ratio: 1.19 ratio (ref 0.00–1.34)
dPT: 43.7 s (ref 0.0–47.6)

## 2023-12-16 LAB — PROTEIN S PANEL
Protein S Activity: 79 % (ref 63–140)
Protein S Ag, Free: 113 % (ref 61–136)
Protein S Ag, Total: 85 % (ref 60–150)

## 2023-12-16 LAB — HEXAGONAL PHASE PHOSPHOLIPID: Hex Phosph Neut Test: 2 s (ref 0–11)

## 2023-12-16 LAB — HEX PHASE PHOSPHOLIPID REFLEX

## 2023-12-16 LAB — DRVVT MIX: dRVVT Mix: 41.2 s — ABNORMAL HIGH (ref 0.0–40.4)

## 2023-12-16 LAB — DRVVT CONFIRM: dRVVT Confirm: 0.9 ratio (ref 0.8–1.2)

## 2023-12-16 MED ORDER — BIMATOPROST 0.03 % EX SOLN: 1.0000 | Freq: Every day | CUTANEOUS | 6 refills | Status: AC

## 2023-12-16 NOTE — Telephone Encounter (Signed)
 Clarifying Latisse prescription. aw

## 2023-12-20 ENCOUNTER — Ambulatory Visit: Admitting: Dermatology

## 2023-12-20 LAB — FACTOR 5 LEIDEN

## 2023-12-20 LAB — PROTHROMBIN GENE MUTATION

## 2023-12-28 ENCOUNTER — Encounter: Payer: Self-pay | Admitting: Oncology

## 2023-12-28 ENCOUNTER — Inpatient Hospital Stay (HOSPITAL_BASED_OUTPATIENT_CLINIC_OR_DEPARTMENT_OTHER): Admitting: Oncology

## 2023-12-28 VITALS — BP 140/88 | HR 89 | Temp 97.6°F | Resp 18 | Ht 63.0 in | Wt 229.0 lb

## 2023-12-28 DIAGNOSIS — Z09 Encounter for follow-up examination after completed treatment for conditions other than malignant neoplasm: Secondary | ICD-10-CM | POA: Diagnosis not present

## 2023-12-28 DIAGNOSIS — Z86718 Personal history of other venous thrombosis and embolism: Secondary | ICD-10-CM | POA: Diagnosis not present

## 2023-12-28 DIAGNOSIS — Z86711 Personal history of pulmonary embolism: Secondary | ICD-10-CM | POA: Diagnosis not present

## 2023-12-28 NOTE — Progress Notes (Signed)
 Hematology/Oncology Consult note [facility]  Telephone:([phone] [phone]  Patient Care Team: Jyl Railing, MD as PCP - General (Family Medicine) End, Lonni, MD as PCP - Cardiology (Cardiology)   Name of the patient: Misty Hebert  [id number]  1950/10/27   Date of visit: 12/28/23  Diagnosis-history of recurrent DVT  Chief complaint/ Reason for visit-discussed results of hypercoagulable workup  Heme/Onc history: patient is a 73 year old female who has a past history of recurrent DVT.  States that she has had her first episode of DVT sometime in 1995.  She was also found to have DVT in the distal superficial femoral vein and popliteal vein in the right leg back in January 2014.  She also had another episode of DVT sometime in between 1995 and 2014.  In April 2025 patient went to the hospital with symptoms of worsening shortness of breath and pain on deep inspiration.  CT angio chest showed  Extensive pulmonary emboli bilaterally and patient underwent thrombectomy for the same.    Lower extremity ultrasound showed normal  Occlusive DVT in the right common femoral popliteal and calf veins.  Patient is presently on Eliquis  and is tolerating it well.  Prior to her recent episode of DVT and PE patient did not have any hospitalizations or surgeries.  No prolonged episodes of immobilization.  Her daughter died of a PE in her 16s.   Results of hyperCoagulable workup including factor V Leiden, prothrombin gene mutation, Antithrombin III  levels protein C protein S panel unremarkable.  Testing for antiphospholipid antibody syndrome negative.  Although her anticardiolipin IgM was mildly elevated at 14 that does not meet the cutoff of 40 required for diagnosis.   Interval history-tolerating Eliquis  well without any significant side effects  ECOG PS- 1 Pain scale- 0   Review of systems- Review of Systems  Constitutional:  Negative for chills, fever,  malaise/fatigue and weight loss.  HENT:  Negative for congestion, ear discharge and nosebleeds.   Eyes:  Negative for blurred vision.  Respiratory:  Negative for cough, hemoptysis, sputum production, shortness of breath and wheezing.   Cardiovascular:  Negative for chest pain, palpitations, orthopnea and claudication.  Gastrointestinal:  Negative for abdominal pain, blood in stool, constipation, diarrhea, heartburn, melena, nausea and vomiting.  Genitourinary:  Negative for dysuria, flank pain, frequency, hematuria and urgency.  Musculoskeletal:  Negative for back pain, joint pain and myalgias.  Skin:  Negative for rash.  Neurological:  Negative for dizziness, tingling, focal weakness, seizures, weakness and headaches.  Endo/Heme/Allergies:  Does not bruise/bleed easily.  Psychiatric/Behavioral:  Negative for depression and suicidal ideas. The patient does not have insomnia.       Allergies  Allergen Reactions   Chlorpromazine Anaphylaxis and Other (See Comments)    Other Reaction: LOC Other Reaction: LOC Other Reaction: LOC Other Reaction: LOC    Metoclopramide Other (See Comments) and Anaphylaxis    Other Reaction: RESTLESS Twitching , cant sit still  Other Reaction: RESTLESS Other Reaction: RESTLESS Other Reaction: RESTLESS    Sulfamethizole Nausea And Vomiting and Other (See Comments)   Amlodipine      BLLE edema   Other Other (See Comments)    Torazine gave her syncope Upset stomach Upset stomach    Celecoxib Nausea Only and Anxiety   Meloxicam  Other (See Comments) and Diarrhea     Past Medical History:  Diagnosis Date   Actinic keratosis    Arthritis of knee    Benign essential HTN    Chest  pain    a. she reports 4 normal heart caths; b. 12/2013 [facility]: nl Avelina); c. 04/2016 MV: apical and ant defect @ rest consistent w/ breast attenuation. No ischemia. EF 55-65%.   Depression    Diverticulitis of intestine w/o perforation or abscess w/o bleeding    DVT  (deep venous thrombosis) (HCC)    Dysphagia    Fibromyalgia    High cholesterol    History of colon resection    History of echocardiogram    a. 01/2017 Echo ([facility]): nl LV syst fxn.   Hyperlipidemia    Medial meniscus tear    Migraine    Neuromuscular disorder (HCC)    Obesity    Overactive bladder    Reflux    Subchondral insufficiency fracture of condyle of femur (HCC)    Tension headache      Past Surgical History:  Procedure Laterality Date   ABDOMINAL HYSTERECTOMY     BREAST BIOPSY Right    CYST REMOVED-16-35YRS AGO-NEG   BREAST BIOPSY Left 03/24/2021   stereo bx of calcs UOQ anterior #1, coil marker, benign   BREAST BIOPSY Left 03/24/2021   stereo bx calcs UOQ posterior #2, x marker, benign   CARDIAC CATHETERIZATION  1995   [facility]   CARDIAC [facility]TERIZATION  2001   [facility]   CARDIAC CATHETERIZATION  2006   [facility]   CARDIAC CATHETERIZATION     [facility] with Dr. Hester    COLON RESECTION     ESOPHAGOGASTRODUODENOSCOPY (EGD) WITH PROPOFOL  N/A 02/06/2022   Procedure: ESOPHAGOGASTRODUODENOSCOPY (EGD) WITH PROPOFOL ;  Surgeon: Maryruth Ole DASEN, MD;  Location: [facility] ENDOSCOPY;  Service: Endoscopy;  Laterality: N/A;   FOOT SURGERY Left 01/10/2016   03/06/16 second foot surgery   KNEE SURGERY     PERIPHERAL VASCULAR THROMBECTOMY Right 10/21/2023   Procedure: PERIPHERAL VASCULAR THROMBECTOMY;  Surgeon: Marea Selinda RAMAN, MD;  Location: [facility] INVASIVE CV LAB;  Service: Cardiovascular;  Laterality: Right;   PULMONARY THROMBECTOMY Bilateral 10/20/2023   Procedure: PULMONARY THROMBECTOMY;  Surgeon: Marea Selinda RAMAN, MD;  Location: [facility] INVASIVE CV LAB;  Service: Cardiovascular;  Laterality: Bilateral;   REVISION TOTAL HIP ARTHROPLASTY     June 2019   SHOULDER SURGERY      Social History   Socioeconomic History   Marital status: Married    Spouse name: Not on file   Number of children: Not on file   Years of education: Not on file   Highest education level: Not on  file  Occupational History   Not on file  Tobacco Use   Smoking status: Never   Smokeless tobacco: Never  Vaping Use   Vaping status: Never Used  Substance and Sexual Activity   Alcohol use: No   Drug use: No   Sexual activity: Not Currently  Other Topics Concern   Not on file  Social History Narrative   Not on file   Social Drivers of Health   Financial Resource Strain: Low Risk  (12/27/2022)   [facility]   Overall Financial Resource Strain (CARDIA)    Difficulty of Paying Living Expenses: Not hard at all  Food Insecurity: No Food Insecurity (12/13/2023)   Hunger Vital Sign    Worried About Running Out of Food in the Last Year: Never true    Ran Out of Food in the Last Year: Never true  Transportation Needs: No Transportation Needs (12/13/2023)   PRAPARE - Transportation    Lack of Transportation (  Medical): No    Lack of Transportation (Non-Medical): No  Physical Activity: Not on file  Stress: Not on file  Social Connections: Moderately Isolated (10/19/2023)   Social Connection and Isolation Panel    Frequency of Communication with Friends and Family: More than three times a week    Frequency of Social Gatherings with Friends and Family: More than three times a week    Attends Religious Services: Never    Database administrator or Organizations: No    Attends Banker Meetings: Never    Marital Status: Married  Catering manager Violence: Not At Risk (12/13/2023)   Humiliation, Afraid, Rape, and Kick questionnaire    Fear of Current or Ex-Partner: No    Emotionally Abused: No    Physically Abused: No    Sexually Abused: No    Family History  Problem Relation Age of Onset   Heart attack Mother    Heart disease Mother        pacemaker/ICD   Heart attack Father    Stroke Father    Lung cancer Father    Heart disease Brother        stents in early 50's   Breast cancer Maternal Aunt 63   Leukemia Paternal Grandmother       Current Outpatient Medications:    carvedilol  (COREG ) 6.25 MG tablet, Take 6.25 mg by mouth 2 (two) times daily with a meal., Disp: , Rfl:    cyanocobalamin (VITAMIN B12) 1000 MCG/ML injection, Inject 1,000 mcg into the muscle every 30 (thirty) days., Disp: , Rfl:    dicyclomine  (BENTYL ) 10 MG capsule, Take 10 mg by mouth 2 (two) times daily as needed., Disp: , Rfl:    ELIQUIS  5 MG TABS tablet, Take 5 mg by mouth every 12 (twelve) hours., Disp: , Rfl:    ezetimibe  (ZETIA ) 10 MG tablet, Take 10 mg by mouth daily., Disp: , Rfl:    [Paused] hydrochlorothiazide  (HYDRODIURIL ) 25 MG tablet, Take 1 tablet (25 mg total) by mouth daily., Disp: 90 tablet, Rfl: 3   [Paused] losartan  (COZAAR ) 100 MG tablet, Take 50 mg by mouth daily., Disp: , Rfl:    nitroGLYCERIN  (NITROSTAT ) 0.4 MG SL tablet, Place 1 tablet (0.4 mg total) under the tongue every 5 (five) minutes as needed for chest pain (maximum of 3 doses.)., Disp: 25 tablet, Rfl: 2   omeprazole (PRILOSEC) 40 MG capsule, Take 20 mg by mouth daily., Disp: , Rfl:    pravastatin  (PRAVACHOL ) 80 MG tablet, Take 1 tablet (80 mg total) by mouth daily., Disp: 90 tablet, Rfl: 3   bimatoprost  (LATISSE ) 0.03 % ophthalmic solution, Place 1 Application into both eyes at bedtime. Place one drop on applicator and apply evenly along the skin of the upper eyelid at base of eyelashes once daily at bedtime; repeat procedure for second eye (use a clean applicator). (Patient not taking: Reported on 12/28/2023), Disp: 3 mL, Rfl: 6   bimatoprost  (LUMIGAN ) 0.01 % SOLN, Apply to eyelids at bedtime (Patient not taking: Reported on 12/28/2023), Disp: 3 mL, Rfl: 6   fluticasone  (FLONASE ) 50 MCG/ACT nasal spray, Place 2 sprays into both nostrils daily. (Patient not taking: Reported on 12/28/2023), Disp: 16 mL, Rfl: 0   REPATHA SURECLICK 140 MG/ML SOAJ, Inject 140 mg into the skin every 14 (fourteen) days. (Patient not taking: Reported on 12/28/2023), Disp: , Rfl:    Semaglutide,0.25 or  0.5MG /DOS, (OZEMPIC, 0.25 OR 0.5 MG/DOSE,) 2 MG/1.5ML SOPN, Inject 0.5 mg into the skin once  a week. (Patient not taking: Reported on 12/28/2023), Disp: , Rfl:   Physical exam:  Vitals:   12/28/23 1005  BP: (!) 140/88  Pulse: 89  Resp: 18  Temp: 97.6 F (36.4 C)  TempSrc: Tympanic  SpO2: 98%  Weight: 229 lb (103.9 kg)  Height: 5' 3 (1.6 m)   Physical Exam  Cardiovascular:     Rate and Rhythm: Normal rate and regular rhythm.     Heart sounds: Normal heart sounds.  Pulmonary:     Effort: Pulmonary effort is normal.     Breath sounds: Normal breath sounds.  Abdominal:     General: Bowel sounds are normal.   Skin:    General: Skin is warm and dry.   Neurological:     Mental Status: She is alert and oriented to person, place, and time.      I have personally reviewed labs listed below:    Latest Ref Rng & Units 10/22/2023   12:20 AM  CMP  Glucose 70 - 99 mg/dL 870   BUN 8 - 23 mg/dL 13   Creatinine 9.55 - 1.00 mg/dL 9.09   Sodium 864 - 854 mmol/L 133   Potassium 3.5 - 5.1 mmol/L 3.9   Chloride 98 - 111 mmol/L 102   CO2 22 - 32 mmol/L 22   Calcium 8.9 - 10.3 mg/dL 8.5       Latest Ref Rng & Units 10/22/2023   12:20 AM  CBC  WBC 4.0 - 10.5 K/uL 7.7   Hemoglobin 12.0 - 15.0 g/dL 88.1   Hematocrit 63.9 - 46.0 % 33.4   Platelets 150 - 400 K/uL 155    I have personally reviewed Radiology images listed below: No images are attached to the encounter.  VAS US  LOWER EXTREMITY VENOUS (DVT) Result Date: 12/02/2023  Lower Venous DVT Study Patient Name:  ROBI MITTER [facility]  Date of Exam:   11/30/2023 Medical Rec #: [id number]        Accession #:    [id number] Date of Birth: 18-Mar-1951        Patient Gender: F Patient Age:   45 years Exam Location:  [facility] Procedure:      VAS US  LOWER EXTREMITY VENOUS (DVT) Referring Phys: SELINDA GU --------------------------------------------------------------------------------  Indications: Post-op.  Risk Factors: Confirmed PE  Surgery 10/21/2023 RIght SFA, Pop and CFA thrombectomy. Anticoagulation: Eliquis . Performing Technologist: Donnice Charnley RVT  Examination Guidelines: A complete evaluation includes B-mode imaging, spectral Doppler, color Doppler, and power Doppler as needed of all accessible portions of each vessel. Bilateral testing is considered an integral part of a complete examination. Limited examinations for reoccurring indications may be performed as noted. The reflux portion of the exam is performed with the patient in reverse Trendelenburg.  +---------+---------------+---------+-----------+----------+--------------+ RIGHT    CompressibilityPhasicitySpontaneityPropertiesThrombus Aging +---------+---------------+---------+-----------+----------+--------------+ CFV      Full           Yes      Yes                                 +---------+---------------+---------+-----------+----------+--------------+ SFJ      Full                    Yes                                 +---------+---------------+---------+-----------+----------+--------------+  FV Prox  Full           Yes      Yes                                 +---------+---------------+---------+-----------+----------+--------------+ FV Mid   Full           Yes      Yes                                 +---------+---------------+---------+-----------+----------+--------------+ FV DistalFull           Yes      Yes                                 +---------+---------------+---------+-----------+----------+--------------+ PFV      Full                    Yes                                 +---------+---------------+---------+-----------+----------+--------------+ POP      Full           Yes      Yes                                 +---------+---------------+---------+-----------+----------+--------------+ PTV      Full                                                         +---------+---------------+---------+-----------+----------+--------------+ PERO     Full                                                        +---------+---------------+---------+-----------+----------+--------------+ GSV      Full                    Yes                                 +---------+---------------+---------+-----------+----------+--------------+ SSV      Full                                                        +---------+---------------+---------+-----------+----------+--------------+   +----+---------------+---------+-----------+----------+--------------+ LEFTCompressibilityPhasicitySpontaneityPropertiesThrombus Aging +----+---------------+---------+-----------+----------+--------------+ CFV Full           Yes      Yes                                 +----+---------------+---------+-----------+----------+--------------+    Summary: RIGHT: - There is no evidence of deep vein thrombosis in  the lower extremity. - There is no evidence of superficial venous thrombosis.  - No cystic structure found in the popliteal fossa.  LEFT: - No evidence of common femoral vein obstruction.   *See table(s) above for measurements and observations. Electronically signed by Selinda Gu MD on 12/02/2023 at 10:06:19 AM.    Final      Assessment and plan- Patient is a 73 y.o. female with history of recurrent DVT currently on Eliquis  here for routine follow-up  Patient had her fourth episode of DVT in April 2025 and at that time patient also had bilateral pulmonary emboli.  She will therefore remain on Eliquis  lifelong.  Discussed the results of hyperCoagulable work which were unremarkable.  Eliquis  can be prescribed by her primary care doctor at this time.  She does not require any follow-up with m she can be referred to me in the future if questions or concerns arisee.    Visit Diagnosis 1. History of recurrent deep vein thrombosis (DVT)      Dr. Annah Skene, MD, MPH [facility]  at [facility] [phone] 12/28/2023 1:11 PM

## 2023-12-28 NOTE — Progress Notes (Signed)
 Since last April, she has been having  shortness of breath and very little energy. July 8th she is supposed to have a cardiogram test done but she is going to reschedule due to going out of town.

## 2024-01-03 NOTE — Progress Notes (Signed)
 Chief Complaint:   Chief Complaint  Patient presents with  . Hypertension  . Diabetes    Subjective  Misty Hebert is a 73 y.o. female in today for: History of Present Illness Misty Hebert is a 73 year old female with a history of blood clots who presents with recent episodes of chest pain.  She has been experiencing episodes of chest pain, with the most recent occurring two days ago while at rest. The pain, described as sharp, was located in the upper chest and lasted about 30 minutes before resolving on its own. Similar episodes have occurred over the past few weeks, all subsiding without intervention. No current chest pain or discomfort today.  She has a history of blood clots and is on lifelong anticoagulation therapy with Eliquis , which has led to significant bruising on her forearms. Her blood work did not reveal any genetic predisposition for clotting disorders. She recalls a past episode of sharp chest pain while cooking, accompanied by tingling in both arms, which led to a diagnosis of pulmonary embolisms.  She has been experiencing diarrhea for almost two months, characterized by urgency and watery stools up to four times a day. This has started to improve in the last week, with stools beginning to form again. She wonders if this could be related to her medication, Eliquis , or possibly antibiotics she received during a recent hospital stay.  She is on pravastatin  and Zetia  for cholesterol management, as she did not tolerate Lipitor or Crestor due to muscle pain associated with her fibromyalgia. Her LDL has improved significantly from 200 to 80.  She is managing her diabetes with regular monitoring and has requested new diabetic supplies. Her blood pressure has been stable, and she did not require insulin  during her recent hospital stay. Hypertension  Diabetes       Patient Active Problem List  Diagnosis  . Essential hypertension  . Depression  . Fibromyalgia  .  Hyperlipidemia, mixed  . Overactive bladder  . Trochanteric bursitis of left hip  . Hip pain  . History of deep venous thrombosis-postsurgical  . Arthritis of knee  . Dysphagia  . Morbid obesity with BMI of 40.0-44.9 (HCC)  . Carpal tunnel syndrome  . Mild persistent asthma without complication (HHS-HCC)  . History of total hip arthroplasty  . Dermatochalasis of both upper eyelids  . Leukocytosis  . Low back pain  . On long term drug therapy  . Osteoarthritis of right hip  . Primary localized osteoarthritis of pelvic region and thigh  . S/P colon resection  . Excessive daytime sleepiness  . Adjustment reaction with anxiety and depression  . Gastroesophageal reflux disease with spasms  . Chronic heart failure with preserved ejection fraction (HFpEF) (CMS/HHS-HCC)  . Referral of patient  . Incisional hernia, without obstruction or gangrene  . History of pulmonary embolus (PE)- [facility] ANTICOAGULATION per vascular  . Type 2 diabetes mellitus without complication, without long-term current use of insulin  (CMS/HHS-HCC)  . Hepatic steatosis  . B12 def. is LPN does self-home injetions  . Major depressive disorder, recurrent, mild ()  . Avascular necrosis of femoral head (CMS/HHS-HCC)  . Coronary artery disease involving native coronary artery of native heart with angina pectoris ()  . Lumbar spondylosis  . Thoracic radiculopathy  . multiple segment PE spontaneous 2025    Outpatient Medications Prior to Visit  Medication Sig Dispense Refill  . albuterol  MDI, PROVENTIL , VENTOLIN , PROAIR , HFA 90 mcg/actuation inhaler Inhale 2 inhalations into the lungs every  4 (four) hours as needed for Wheezing 1 each 1  . baclofen (LIORESAL) 10 MG tablet 10 mg as directed    . BD ECLIPSE 25 gauge x 1 1/2 Ndle USE TO INJECT THE B12 IN YOUR DELTOID MUSCLE EVERY 30 DAYS 3 each 3  . BD LUER-LOK SYRINGE 1 mL syringe USE AS DIRECTED 3 each 3  . blood glucose diagnostic test strip 1 each (1 strip total)  once daily DM, type II 250.00 100 each 3  . blood glucose meter kit as directed 1 each 0  . carvediloL  (COREG ) 6.25 MG tablet TAKE 1 TABLET TWICE DAILY WITH MEALS 200 tablet 3  . cyanocobalamin (VITAMIN B12) 1,000 mcg/mL injection INJECT 1 ML INTO THE MUSCLE EVERY 30 DAYS 3 mL 3  . dicyclomine  (BENTYL ) 10 mg capsule Take 1 capsule (10 mg total) by mouth 2 (two) times daily as needed 90 capsule 1  . ELIQUIS  5 mg tablet TAKE 1 TABLET EVERY 12 HOURS 60 tablet 11  . evolocumab (REPATHA SURECLICK) 140 mg/mL PnIj Inject 140 mg subcutaneously every 14 (fourteen) days 2 mL 2  . ezetimibe  (ZETIA ) 10 mg tablet TAKE 1 TABLET ONE TIME DAILY 100 tablet 3  . hydroCHLOROthiazide  (HYDRODIURIL ) 25 MG tablet TAKE 1 TABLET ONE TIME DAILY 100 tablet 3  . lancets Use 1 each once daily Use as instructed. 100 each 3  . losartan  (COZAAR ) 100 MG tablet TAKE 1 TABLET EVERY DAY (Patient taking differently: Take 50 mg by mouth once daily) 100 tablet 3  . nitroGLYcerin  (NITROSTAT ) 0.4 MG SL tablet Place 0.4 mg under the tongue    . omeprazole (PRILOSEC) 20 MG DR capsule TAKE 1 CAPSULE ONE TIME DAILY (Patient taking differently: Take 40 mg by mouth once daily) 100 capsule 3  . pravastatin  (PRAVACHOL ) 80 MG tablet Take 1 tablet (80 mg total) by mouth once daily 90 tablet 3  . semaglutide (OZEMPIC) 0.25 mg or 0.5 mg (2 mg/3 mL) pen injector Inject 0.375 mLs (0.25 mg total) subcutaneously once a week 3 mL 1   Facility-Administered Medications Prior to Visit  Medication Dose Route Frequency Provider Last Rate Last Admin  . cyanocobalamin (VITAMIN B12) injection 1,000 mcg  1,000 mcg Intramuscular Q30 Days Jyl Heron Haff, MD         Objective  Vitals:   01/03/24 1315  BP: 108/73  Pulse: 64  Weight: (!) 104.7 kg (230 lb 13.2 oz)  Height: 160 cm (5' 2.99)  PainSc: 0-No pain   Body mass index is 40.9 kg/m. Home Vitals:     Physical Exam  Physical Exam Vitals and nursing note reviewed.  Constitutional:       Appearance: She is well-developed.  HENT:     Head: Normocephalic and atraumatic.   Eyes:     Conjunctiva/sclera: Conjunctivae normal.    Cardiovascular:     Rate and Rhythm: Normal rate and regular rhythm.     Heart sounds: Normal heart sounds.  Pulmonary:     Effort: Pulmonary effort is normal.     Breath sounds: Normal breath sounds.   Neurological:     Mental Status: She is alert.   Psychiatric:        Behavior: Behavior normal.      Results LABS Comprehensive Metabolic Panel (CMP): Normal liver and kidney function, glucose levels within normal range (10/2023) Complete Blood Count (CBC): No anemia detected (10/2023) Lipid Panel: LDL 80 mg/dL (95/7974) Urinalysis: No proteinuria (10/2023)  RADIOLOGY Chest CT: Multiple small pulmonary emboli (  10/18/2023)  DIAGNOSTIC EKG: Normal sinus rhythm (10/18/2023)  No results found for this visit on 01/03/24.     Assessment/Plan:   Assessment & Plan Pulmonary Embolism   She is on lifelong anticoagulation therapy with Eliquis  due to pulmonary embolism. Recent episodes of heavy chest pain lasting about 30 minutes occurred at rest, similar to previous episodes associated with her pulmonary embolism, and resolved spontaneously. No current chest pain at this exact moment. However...SABRASABRAThere is concern about a new or unresolved clot. Send to the ER for a to assess for any new or unresolved pulmonary embolism. Continue anticoagulation therapy with Eliquis . Attempted call to vascular office and no answer.  Diarrhea   She reports diarrhea for almost two months, characterized by urgency and watery stools up to four times a day, now subsiding. Antibiotics during a recent hospital stay may have disrupted her gut flora. Recommend over-the-counter probiotics to restore gut flora. If symptoms persist, consider testing for C. difficile infection.  Diabetes Mellitus   She requires diabetic supplies. Her recent A1c and blood glucose  levels are well-managed, and she did not require insulin  during her recent hospital stay. Provide prescription for diabetic supplies, including strips and solution.  Hyperlipidemia   She is on pravastatin  and Zetia  for hyperlipidemia. She did not tolerate atorvastatin and Crestor due to muscle pain. Her LDL improved from 200 to 80, indicating good lipid control. Continue pravastatin  and Zetia .  Fibromyalgia   Fibromyalgia influences her medication choices for hyperlipidemia due to muscle pain associated with certain statins.  General Health Maintenance   Colonoscopy is deferred due to current anticoagulation therapy and recent pulmonary embolism. She cannot discontinue anticoagulation therapy for six months, and any procedure would require resuming medication immediately post-procedure. Defer colonoscopy until it is safe to discontinue anticoagulation therapy for the procedure.  Follow-up   She has follow-up appointments with cardiology for an echocardiogram on July 22 and a subsequent visit. She also has a follow-up with her medical assistant in August. Consideration for an earlier cardiology evaluation due to recent chest pain episodes. Ensure follow-up with cardiology for echocardiogram on July 22 and subsequent visit. Follow up with medical assistant in August. Consider earlier cardiology evaluation if symptoms persist.  Diagnoses and all orders for this visit:  Type 2 diabetes mellitus without complication, without long-term current use of insulin  (CMS/HHS-HCC)  Essential hypertension  Screening for colon cancer needed but not priority today, so we will revisit later.  Acute pulmonary embolism, unspecified pulmonary embolism type, unspecified whether acute cor pulmonale present (CMS/HHS-HCC)-    This visit was coded based on medical decision making (MDM).           Future Appointments   This patient does not currently have any appointments scheduled.     There are no  Patient Instructions on file for this visit.  An after visit summary was provided for the patient either in written format (printed) or through [facility].  This note has been created using automated tools and reviewed for accuracy by BARBARA D ALDRIDGE.

## 2024-01-04 ENCOUNTER — Emergency Department
Admission: EM | Admit: 2024-01-04 | Discharge: 2024-01-04 | Disposition: A | Discharge status: Home / Self Care | Attending: Emergency Medicine | Admitting: Emergency Medicine

## 2024-01-04 ENCOUNTER — Emergency Department

## 2024-01-04 ENCOUNTER — Telehealth (INDEPENDENT_AMBULATORY_CARE_PROVIDER_SITE_OTHER): Payer: Self-pay | Admitting: Nurse Practitioner

## 2024-01-04 ENCOUNTER — Encounter: Payer: Self-pay | Admitting: Emergency Medicine

## 2024-01-04 DIAGNOSIS — Z7901 Long term (current) use of anticoagulants: Secondary | ICD-10-CM | POA: Diagnosis not present

## 2024-01-04 DIAGNOSIS — R0602 Shortness of breath: Secondary | ICD-10-CM | POA: Diagnosis present

## 2024-01-04 DIAGNOSIS — R079 Chest pain, unspecified: Secondary | ICD-10-CM | POA: Insufficient documentation

## 2024-01-04 LAB — CBC
HCT: 44.1 % (ref 36.0–46.0)
Hemoglobin: 14.7 g/dL (ref 12.0–15.0)
MCH: 31 pg (ref 26.0–34.0)
MCHC: 33.3 g/dL (ref 30.0–36.0)
MCV: 93 fL (ref 80.0–100.0)
Platelets: 211 10*3/uL (ref 150–400)
RBC: 4.74 MIL/uL (ref 3.87–5.11)
RDW: 12.9 % (ref 11.5–15.5)
WBC: 7.1 10*3/uL (ref 4.0–10.5)
nRBC: 0 % (ref 0.0–0.2)

## 2024-01-04 LAB — BASIC METABOLIC PANEL WITH GFR
Anion gap: 10 (ref 5–15)
BUN: 22 mg/dL (ref 8–23)
CO2: 28 mmol/L (ref 22–32)
Calcium: 9.2 mg/dL (ref 8.9–10.3)
Chloride: 103 mmol/L (ref 98–111)
Creatinine, Ser: 0.95 mg/dL (ref 0.44–1.00)
GFR, Estimated: >60 mL/min (ref >60)
Glucose, Bld: 157 mg/dL — ABNORMAL HIGH (ref 70–99)
Potassium: 4 mmol/L (ref 3.5–5.1)
Sodium: 141 mmol/L (ref 135–145)

## 2024-01-04 LAB — TROPONIN I (HIGH SENSITIVITY)
Troponin I (High Sensitivity): 3 ng/L (ref <18)
Troponin I (High Sensitivity): 3 ng/L (ref <18)

## 2024-01-04 LAB — BRAIN NATRIURETIC PEPTIDE: B Natriuretic Peptide: 31 pg/mL (ref 0.0–100.0)

## 2024-01-04 MED ORDER — HYDROMORPHONE HCL 1 MG/ML IJ SOLN
0.5000 mg | Freq: Once | INTRAMUSCULAR | Status: AC
  Administered 2024-01-04: 0.5 mg via INTRAVENOUS
  Filled 2024-01-04: qty 0.5

## 2024-01-04 MED ORDER — IOHEXOL 350 MG/ML SOLN
75.0000 mL | Freq: Once | INTRAVENOUS | Status: AC | PRN
  Administered 2024-01-04: 75 mL via INTRAVENOUS

## 2024-01-04 MED ORDER — ONDANSETRON HCL 4 MG/2ML IJ SOLN
4.0000 mg | Freq: Once | INTRAMUSCULAR | Status: AC
  Administered 2024-01-04: 4 mg via INTRAVENOUS
  Filled 2024-01-04: qty 2

## 2024-01-04 MED ORDER — HYDROCODONE-ACETAMINOPHEN 5-325 MG PO TABS: 1.0000 | ORAL_TABLET | ORAL | 0 refills | Status: AC | PRN

## 2024-01-04 NOTE — ED Provider Notes (Signed)
 [facility] Provider Note    Event Date/Time   First MD Initiated Contact with Patient 01/04/24 1355     (approximate)   History   Shortness of Breath and Chest Pain   HPI  Misty Hebert is a 73 y.o. female past medical history significant for PEs and DVTs on Eliquis , presents to the emergency department with chest pain.  Patient states that she was recently diagnosed with bilateral pulmonary embolisms and DVTs and had vascular procedure.  Patient states that she has been compliant with her Eliquis  and has not missed any doses.  Last dose of Eliquis  was this morning.  Currently rates her chest pain as 6/10.  States that she is very anxious and stressed.     Physical Exam   Triage Vital Signs: ED Triage Vitals  Encounter Vitals Group     BP 01/04/24 1107 139/89     Girls Systolic BP Percentile --      Girls Diastolic BP Percentile --      Boys Systolic BP Percentile --      Boys Diastolic BP Percentile --      Pulse Rate 01/04/24 1107 70     Resp 01/04/24 1107 18     Temp 01/04/24 1107 98 F (36.7 C)     Temp Source 01/04/24 1107 Oral     SpO2 01/04/24 1107 99 %     Weight 01/04/24 1106 228 lb (103.4 kg)     Height 01/04/24 1106 5' 3 (1.6 m)     Head Circumference --      Peak Flow --      Pain Score 01/04/24 1106 5     Pain Loc --      Pain Education --      Exclude from Growth Chart --     Most recent vital signs: Vitals:   01/04/24 1509 01/04/24 1530  BP: (!) 147/75 (!) 143/72  Pulse:  (!) 59  Resp:  13  Temp: 98.2 F (36.8 C)   SpO2:  95%    Physical Exam Constitutional:      Appearance: She is well-developed.     Comments: Tearful and crying  HENT:     Head: Atraumatic.   Eyes:     Conjunctiva/sclera: Conjunctivae normal.    Cardiovascular:     Rate and Rhythm: Regular rhythm.  Pulmonary:     Effort: No respiratory distress.     Breath sounds: No wheezing.  Abdominal:     General: There is no distension.    Musculoskeletal:        General: Normal range of motion.     Cervical back: Normal range of motion.     Right lower leg: No edema.     Left lower leg: No edema.   Skin:    General: Skin is warm.     Capillary Refill: Capillary refill takes less than 2 seconds.   Neurological:     General: No focal deficit present.     Mental Status: She is alert. Mental status is at baseline.     IMPRESSION / MDM / ASSESSMENT AND PLAN / ED COURSE  I reviewed the triage vital signs and the nursing notes.  Differential diagnosis including heart failure, ACS, anemia, pulmonary embolism, pneumonia, anxiety    EKG  I, Clotilda Punter, the attending physician, personally viewed and interpreted this ECG.   Rate: Normal  Rhythm: Normal sinus  Axis: Left  Intervals: Normal  ST&T Change:  None No change when compared to prior  No tachycardic or bradycardic dysrhythmias while on cardiac telemetry.  RADIOLOGY I independently reviewed imaging, my interpretation of imaging: Cardiomegaly, no pneumonia, no pulmonary edema, no pleural effusions  LABS (all labs ordered are listed, but only abnormal results are displayed) Labs interpreted as -    Labs Reviewed  BASIC METABOLIC PANEL WITH GFR - Abnormal; Notable for the following components:      Result Value   Glucose, Bld 157 (*)    All other components within normal limits  CBC  BRAIN NATRIURETIC PEPTIDE  TROPONIN I (HIGH SENSITIVITY)  TROPONIN I (HIGH SENSITIVITY)     MDM  High risk Wells criteria ordered a CTA to further evaluate for pulmonary embolism.  Initial troponin is negative have low suspicion for ACS.  Heart score 3.  Given Dilaudid  and antiemetics.  No leukocytosis or anemia.  Have a low suspicion for acute pericarditis, no positional change and no findings on EKG, no rub on exam.  CTA currently pending.  Care transfer to incoming provider, if CTA is negative and second troponin is negative plan to discharge home with  outpatient follow-up.     PROCEDURES:  Critical Care performed: No  Procedures  Patient's presentation is most consistent with acute presentation with potential threat to life or bodily function.   MEDICATIONS ORDERED IN ED: Medications  ondansetron  (ZOFRAN ) injection 4 mg (has no administration in time range)  HYDROmorphone  (DILAUDID ) injection 0.5 mg (0.5 mg Intravenous Given 01/04/24 1503)  iohexol  (OMNIPAQUE ) 350 MG/ML injection 75 mL (75 mLs Intravenous Contrast Given 01/04/24 1546)    FINAL CLINICAL IMPRESSION(S) / ED DIAGNOSES   Final diagnoses:  Chest pain, unspecified type     Rx / DC Orders   ED Discharge Orders     None        Note:  This document was prepared using Dragon voice recognition software and may include unintentional dictation errors.   Suzanne Kirsch, MD 01/04/24 1549

## 2024-01-04 NOTE — Telephone Encounter (Signed)
 Pt came in a stated that she has been having episodes pt describes them as chest pain and that she has pulmonary embolism. Pt is requesting to see if fb could put in a CT order

## 2024-01-04 NOTE — ED Notes (Addendum)
 Pt to front desk inquiring about wait time. Pt informed that we were unable to give her a wait time. Pt states that she has been waiting for 2 hours, pt informed that providers are currently in an emergency and we will get her back as soon as possible.

## 2024-01-04 NOTE — ED Notes (Signed)
Warm blankets provided at this time

## 2024-01-04 NOTE — Telephone Encounter (Signed)
 Spoke with Orvin Daring NP.  who states she was not comfortable ordering a CT without evaluation. Given symptoms pt was advised to go to the emergency room. Pt verbalized understanding and states she will go now.

## 2024-01-04 NOTE — ED Provider Notes (Signed)
-----------------------------------------   5:26 PM on 01/04/2024 ----------------------------------------- Patient care assumed from Dr. Suzanne.  Patient CT scan shows no acute finding.  No acute PE.  Repeat troponin negative.  Patient states she is feeling better, reassured by the workup.  Patient continues to have intermittent chest pain possibly related to her chronic PE that she has.  Patient states she was previously on pain medication but is not been on anything for over 1 year.  Will prescribe the patient a very short course of pain medication to be used only if she is having mild pain.  I discussed with the patient should her pain return worsen or develop any shortness of breath nausea or diaphoresis she needs to return to the emergency department.  Patient agreeable to plan otherwise will follow-up with her doctor.    Dorothyann Drivers, MD 01/04/24 [phone]

## 2024-01-04 NOTE — ED Notes (Signed)
 Patient transported to CT

## 2024-01-04 NOTE — ED Notes (Signed)
 Pt verbalizes understanding of discharge instructions. Opportunity for questioning and answers were provided. Pt discharged from ED to home. Pt discharged at this time with provider approval.

## 2024-01-04 NOTE — ED Triage Notes (Signed)
 Pt via POV from home. Pt c/o intermittent mid-chest pain that started last night reports some SOB. Denies any cough and congestion. Pt has a hx of PE in April and takes Eliquis  daily. Pt is A&Ox4 and NAD, ambulatory to triage.

## 2024-01-11 ENCOUNTER — Other Ambulatory Visit

## 2024-01-14 ENCOUNTER — Ambulatory Visit: Admitting: Student

## 2024-01-25 ENCOUNTER — Ambulatory Visit: Attending: Internal Medicine

## 2024-01-25 DIAGNOSIS — I5032 Chronic diastolic (congestive) heart failure: Secondary | ICD-10-CM | POA: Diagnosis not present

## 2024-01-25 LAB — ECHOCARDIOGRAM COMPLETE
AV Mean grad: 5 mmHg
AV Peak grad: 9.7 mmHg
Ao pk vel: 1.56 m/s
Area-P 1/2: 3.53 cm2

## 2024-01-26 ENCOUNTER — Ambulatory Visit: Payer: Self-pay | Admitting: Internal Medicine

## 2024-02-01 ENCOUNTER — Telehealth: Payer: Self-pay | Admitting: Internal Medicine

## 2024-02-01 NOTE — Telephone Encounter (Signed)
   Pre-operative Risk Assessment    Patient Name: Misty Hebert  DOB: June 26, 1951 MRN: [id number]   Date of last office visit: 11/18/23 Date of next office visit: 02/11/24   Request for Surgical Clearance    Procedure:  Lumbar epidural steroid injection   Date of Surgery:  Clearance TBD                                Surgeon:  not indicated Surgeon's Group or Practice Name:  [facility] Phone number:  [phone] Fax number:  [phone]   Type of Clearance Requested:   - Pharmacy:  Hold Apixaban  (Eliquis ) discontinue 3 days prior   Type of Anesthesia:  None    Additional requests/questions:    Bonney Rosina Stamps   02/01/2024, 1:48 PM

## 2024-02-02 NOTE — Telephone Encounter (Signed)
 Pharmacy please advise on holding Eliquis  prior to Lumbar epidural steroid injection  scheduled for TBD. Last labs 01/04/2024. Thank you.

## 2024-02-03 NOTE — Telephone Encounter (Signed)
 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    Procedure:  Lumbar epidural steroid injection  Date of procedure: TBD  Patient with bilateral pulmonary embolism 10/20/23 and lower extremity thrombectomy on 10/21/23. She also has hx of recurrent DVT (4). She will need to be on uninterrupted anticoagulation for 6 months. She now requires life long anticoagulation. She should not hold her Elqiuis until at least the middle of October. At that point, prescribing MD will need to comment if she can hold 3 days for a spinal injection.   **This guidance is not considered finalized until pre-operative APP has relayed final recommendations.**

## 2024-02-03 NOTE — Telephone Encounter (Signed)
 Preoperative team,  Clinical pharmacist have reviewed recommendation and noted that patient should not hold Eliquis  until at least the middle of October.  At that time prescribing MD will need to provide recommendations on Eliquis  hold.  Please forward recommendations to requesting office.  Thank you for your help.  Josefa HERO. Adonia Porada NP-C     02/03/2024, 3:06 PM [facility] [address] Office [phone] Fax [phone]

## 2024-02-03 NOTE — Telephone Encounter (Signed)
 Called requesting office to made them aware of recommendation they asked if we could fax over information which I did sent preop APP note to requesting office

## 2024-02-08 NOTE — Progress Notes (Deleted)
 Cardiology Clinic Note   Date: 02/08/2024 ID: NAWAL BURLING, DOB 1950/08/07, MRN [id number]  Primary Cardiologist:  Lonni Hanson, MD  Chief Complaint   JAVARIA KNAPKE is a 73 y.o. female who presents to the clinic today for ***  Patient Profile   LINDALEE HUIZINGA is followed by *** for the history outlined below.      Past medical history significant for: Nonobstructive CAD. Coronary CTA 12/28/2019: Coronary calcium score 0.  Minimal nonobstructive CAD. Chronic HFpEF. Echo 01/25/2024: EF 60 to 65%.  No RWMA.  Grade I DD.  Normal RV size/function.  Normal PA pressure, RVSP 28 mmHg.  No significant valvular abnormalities. Hypertension. Hyperlipidemia. Lipid panel 11/02/2023: LDL 84, HDL 51, TG 93, total 154. CVA. PE/DVT. Venous ultrasound 07/13/2012: Positive for DVT distal superficial femoral vein and popliteal vein right leg. Venous ultrasound 12/30/2022: No evidence of femoral-popliteal DVT or superficial thrombophlebitis left lower extremity. CTA chest 10/18/2023: Extensive PE worse on the right than the left without evidence of right heart strain. Venous ultrasound 10/19/2023: Nonocclusive DVT in the right common femoral, femoral, popliteal, and calf veins.  No evidence of DVT left lower extremity. Pulmonary thrombectomy 10/20/2023. Peripheral vascular thrombectomy 10/21/2023. CTA chest 01/04/2024: No acute PE.  Small amount of thin stranding in the intermedius pulmonary artery on the right the proximal right lower lobe pulmonary artery in the areas of the previously demonstrated large PE.  This is consistent with mild chronic PE sequelae.  Interval mild groundglass opacity throughout both lungs most likely due to pulmonary edema. Asthma. GERD. T2DM. Fibromyalgia.   In summary, patient was first evaluated by Dr. Hanson on 04/28/2016 for chest pain at the request of Dr. Jyl.  Patient had been evaluated in the ED with negative troponin and EKG showing poor R wave progression across  precordial leads.  Vascular evaluations including 4 catheterizations.  Prior records reviewed demonstrating no significant coronary disease in 1994 and 2000.  Normal exercise stress test in 2015.  History of at least 3 lower extremity DVTs with last episode 4 years prior treated with 6 months of Xarelto.  She did have a negative hypercoagulable workup.  In June 2021 she continued to complain of the same atypical chest pain symptoms.  She reported some improvement in symptoms with pantoprazole  but was concerned about the impact on her kidneys so she stopped it.  She underwent coronary CTA which showed coronary calcium score of 0 with minimal nonobstructive CAD.  Patient was seen in follow-up in October 2024 and continued to complain of chronic sharp, central chest pain radiating to the back unchanged for years.   Patient was last seen in the office by Dr. Hanson on 11/18/2023 for hospital follow-up.  She underwent hospital admission in April for chest pain and near syncope and found to have extensive bilateral PE as well as right lower extremity DVT.  She underwent pulmonary and right lower extremity thrombectomies and was discharged on apixaban .  Echo at the time of her hospital admission demonstrated EF 65 to 70%, no RWMA, Grade I DD, normal RV size/function, aortic valve sclerosis without stenosis.  At her office visit she complained of DOE worse when compared to 6 months prior.  She continued to have twinges of chest pain but nothing like what she experienced around the time of her PE.  She also reported some lower extremity edema for which she was restarted on hydrochlorothiazide .  Decision made to repeat echo in 2 months.  Patient underwent ED evaluation for  intermittent mid chest pain that started the evening prior with associated shortness of breath.  CTA chest demonstrated no acute PE and findings consistent with mild chronic PE sequelae.  Labs were unremarkable with negative troponin.  Patient was provided  with a short course of narcotic pain medication and discharged home.     History of Present Illness    Today, patient ***  Chronic Chest Pain Recurrent sharp chest pain, unchanged in quality for years. Coronary CTA in 2021 showed no calcium. Pain is suspected to be due to spasms. Patient*** -***   Chronic HFpEF/lower extremity edema*** Echo July 2025 showed normal LV/RV function, Grade I DD, normal PA pressure, no significant valvular abnormalities.  Patient*** - Consider use of compression socks during long car rides and prolonged standing.*** - Continue hydrochlorothiazide . - Add Lasix***  DVT/PE. History of multiple DVTs. No hypercoagulability identified on workup.  Patient underwent hospital admission in April 2025 and was found to have extensive bilateral PE as well as right lower extremity DVT for which she underwent pulmonary and right lower extremity thrombectomies.  Repeat CTA July 2025 demonstrated no acute PE and findings consistent with mild chronic PE sequelae.  Patient now on lifelong OAC with recommendation to continue uninterrupted for 6 months from April.*** -Continue Eliquis    Hypertension BP today***.  -Continue carvedilol , hydrochlorothiazide , losartan .    Hyperlipidemia LDL 84 April 2025. -Continue pravastatin  and Repatha.*** -Managed by PCP.   ROS: All other systems reviewed and are otherwise negative except as noted in History of Present Illness.  EKGs/Labs Reviewed        10/18/2023: ALT 27; AST 23 01/04/2024: BUN 22; Creatinine, Ser 0.95; Potassium 4.0; Sodium 141   01/04/2024: Hemoglobin 14.7; WBC 7.1   No results found for requested labs within last 365 days.   01/04/2024: B Natriuretic Peptide 31.0  ***  Risk Assessment/Calculations    {Does this patient have ATRIAL FIBRILLATION?:[phone]} No BP recorded.  {Refresh Note OR Click here to enter BP  :1}***        Physical Exam    VS:  LMP  (LMP Unknown)  , BMI There is no height or weight on  file to calculate BMI.  GEN: Well nourished, well developed, in no acute distress. Neck: No JVD or carotid bruits. Cardiac: *** RRR. *** No murmur. No rubs or gallops.   Respiratory:  Respirations regular and unlabored. Clear to auscultation without rales, wheezing or rhonchi. GI: Soft, nontender, nondistended. Extremities: Radials/DP/PT 2+ and equal bilaterally. No clubbing or cyanosis. No edema ***  Skin: Warm and dry, no rash. Neuro: Strength intact.  Assessment & Plan   ***  Disposition: ***     {Are you ordering a CV Procedure (e.g. stress test, cath, DCCV, TEE, etc)?   Press F2        :[id number]}   Signed, Barnie HERO. Gibran Veselka, DNP, NP-C

## 2024-02-11 ENCOUNTER — Ambulatory Visit: Admitting: Student

## 2024-02-22 NOTE — Progress Notes (Signed)
 Chief Complaint:   Chief Complaint  Patient presents with  . Wound Check    Right lower leg, cut on dishwasher corned last Thursday. Two skin tears to lower leg. Pt states on Sunday she noticed increased swelling and drainage starting today.     Subjective:   Misty Hebert is a 73 y.o. female, past medical history as below here today for concern for skin tear of her right lower leg.  5 days ago she was walking in her kitchen and ran into her open dishwasher.  Tore 2 areas of her anterior lower leg.  Has been cleaning these with soap and water.  However in the past 3 days she has developed swelling of her leg and redness and tenderness surrounding the wounds.  No fevers.  History of PE and DVT this spring, anticoagulated on Eliquis , worried about a possible DVT given the lower extremity pain and swelling.  Patient's past medical history, current problem list, medications reviewed by me in Epic today.   ROS See HPI  Past medical history:  Active Ambulatory Problems    Diagnosis Date Noted  . Essential hypertension 04/06/2011  . Depression 04/06/2011  . Fibromyalgia 04/06/2011  . Hyperlipidemia, mixed 04/06/2011  . Overactive bladder 04/06/2011  . Trochanteric bursitis of left hip 12/22/2011  . Hip pain 01/14/2012  . History of deep venous thrombosis-postsurgical 07/26/2012  . Arthritis of knee 10/04/2013  . Dysphagia 12/13/2013  . Morbid obesity with BMI of 40.0-44.9 (HCC) 07/30/2016  . Carpal tunnel syndrome 11/17/2017  . Mild persistent asthma without complication (HHS-HCC) 11/17/2017  . History of total hip arthroplasty 01/04/2018  . Dermatochalasis of both upper eyelids 08/08/2018  . Leukocytosis 06/07/2018  . Low back pain 06/07/2018  . On long term drug therapy 02/24/2018  . Osteoarthritis of right hip 06/07/2018  . Primary localized osteoarthritis of pelvic region and thigh 06/07/2018  . S/P colon resection 07/25/2013  . Excessive daytime sleepiness 12/25/2019  .  Adjustment reaction with anxiety and depression 11/01/2020  . Gastroesophageal reflux disease with spasms 11/01/2020  . Chronic heart failure with preserved ejection fraction (HFpEF) (CMS/HHS-HCC) 01/17/2020  . Referral of patient 08/18/2021  . Incisional hernia, without obstruction or gangrene 11/06/2021  . History of pulmonary embolus (PE)- [facility] ANTICOAGULATION per vascular 11/24/2021  . Type 2 diabetes mellitus without complication, without long-term current use of insulin  (CMS/HHS-HCC) 12/02/2021  . Hepatic steatosis 12/25/2021  . B12 def. is LPN does self-home injetions 06/03/2022  . Major depressive disorder, recurrent, mild () 12/30/2022  . Avascular necrosis of femoral head (CMS/HHS-HCC) 01/05/2022  . Coronary artery disease involving native coronary artery of native heart with angina pectoris () 04/30/2022  . Lumbar spondylosis 01/05/2022  . Thoracic radiculopathy 01/05/2022  . multiple segment PE spontaneous 2025 10/20/2023   Resolved Ambulatory Problems    Diagnosis Date Noted  . MIGRAINE 04/06/2011  . Tension headache 04/06/2011  . Rotator cuff tear 11/17/2011  . Contusion of knee, left 12/22/2011  . GERD (gastroesophageal reflux disease) 04/12/2012  . Neuromuscular disorder (CMS-HCC) 04/12/2012  . Subchondral insufficiency fracture of femoral condyle (CMS-HCC) 07/26/2012  . Medial meniscus tear 07/26/2012  . Diverticulitis of intestine w/o perforation or abscess w/o bleeding 06/2013 bowel resection [facility]. 07/26/2012  . Knee osteoarthritis 07/04/2013  . Other dysphagia 04/23/2016  . History of esophageal ulcer 04/23/2016  . Ptosis, both eyelids 08/08/2018  . Visual field defect 08/08/2018  . History of 2019 novel coronavirus disease (COVID-19) 11/01/2020  . Prediabetes 08/20/2021   Past Medical  History:  Diagnosis Date  . Allergic state 2001  . Anesthesia complication   . Anxiety   . Asthma without status asthmaticus (HHS-HCC)   . Chronic abdominal pain,  unspecified 04/06/2011  . Chronic chest pain   . COPD (chronic obstructive pulmonary disease) (CMS/HHS-HCC)   . Diverticulosis   . H/O diverticulitis of colon   . H/O: pneumonia 04/2011  . History of blood clots 2001, 2005  . Hyperlipidemia 04/06/2011  . Hypertension   . Obesity   . Osteoarthritis   . Pancreatitis (HHS-HCC)   . PONV (postoperative nausea and vomiting)   . Pulmonary embolism (CMS/HHS-HCC)   . Sinusitis, unspecified   . Wrist fracture, bilateral 1960    Objective:   Vitals:   02/22/24 1258 02/22/24 1301  BP: (!) 158/84 (!) 159/84  Pulse: 80   Resp: 18   Temp: 36.8 C (98.3 F)   TempSrc: Oral   SpO2: 98%   Weight: (!) 104.8 kg (231 lb 0.7 oz)   Height: 160 cm (5' 3)   PainSc:    5  PainLoc:  Leg    Physical Exam Constitutional:      General: She is not in acute distress.    Appearance: Normal appearance. She is not toxic-appearing.  Pulmonary:     Effort: Pulmonary effort is normal.  Musculoskeletal:        General: Normal range of motion.  Skin:    General: Skin is warm and dry.         Comments: 2 large superficial skin tears to right pretibial area.  Erythematous clean wound bed with some sloughing tissue noted to larger wound, proximal  Serosanguineous drainage noted no purulent  Diffuse 2+ pitting edema to pretibial area and right ankle, faint erythema and tenderness surrounding wounds.  Neurological:     Mental Status: She is alert and oriented to person, place, and time.  Psychiatric:        Mood and Affect: Mood normal.        Behavior: Behavior normal.     Assessment/Plan:   MDM:  Skin tears of right lower extremity with new increased pain, redness, swelling of surrounding area.  Concern for cellulitis, doubt DVT.  Not concerning for abscess or necrotizing infection.  Patient with reassuring exam and stable vital signs.  Discussed continued wound care, add mupirocin  and cephalexin .  Low threshold for reevaluation if worsening  symptoms or failure to improve.  Patient comfortable with this plan and verbalizes understanding.    ICD-10-CM   1. Infected skin tear  T14.8XXA    L08.9       Requested Prescriptions   Signed Prescriptions Disp Refills  . cephalexin  (KEFLEX ) 500 MG capsule 21 capsule 0    Sig: Take 1 capsule (500 mg total) by mouth 3 (three) times daily for 7 days  . mupirocin  (BACTROBAN ) 2 % ointment 22 g 0    Sig: Apply topically 2 (two) times daily for 7 days   Take cephalexin  as directed  Wash wound daily and pat dry, apply mupirocin  ointment and nonstick dressing.  Elevate your leg when you can  Skin tears will take a long time to heal.  If you do not improve or if you develop any worsening pain, redness, swelling, puslike drainage from wounds, fevers or chills or other concerning symptoms be reevaluated    A copy of these instructions have been given to the patient or responsible adult who demonstrated the ability to learn.  Discharge instructions and  ED precautions discussed and all questions answered.     Jenna Fowlkes, MSN, NP  (This note was partially made with the aid of speech-to-text dictation. Please excuse any errors)   This note has been created using automated tools and reviewed for accuracy by [facility].

## 2024-02-26 NOTE — Progress Notes (Deleted)
 Cardiology Clinic Note   Date: 02/26/2024 ID: ABIOLA BEHRING, DOB 07-26-1950, MRN [id number]  Primary Cardiologist:  Lonni Hanson, MD  Chief Complaint   DALE STRAUSSER is a 73 y.o. female who presents to the clinic today for ***  Patient Profile   RAMEEN GOHLKE is followed by *** for the history outlined below.      Past medical history significant for: Nonobstructive CAD. Coronary CTA 12/28/2019: Coronary calcium score 0.  Minimal nonobstructive CAD. Chronic HFpEF. Echo 01/25/2024: EF 60 to 65%.  No RWMA.  Grade I DD.  Normal RV size/function.  Normal PA pressure, RVSP 28 mmHg.  No significant valvular abnormalities. Hypertension. Hyperlipidemia. Lipid panel 11/02/2023: LDL 84, HDL 51, TG 93, total 154. CVA. PE/DVT. Venous ultrasound 07/13/2012: Positive for DVT distal superficial femoral vein and popliteal vein right leg. Venous ultrasound 12/30/2022: No evidence of femoral-popliteal DVT or superficial thrombophlebitis left lower extremity. CTA chest 10/18/2023: Extensive PE worse on the right than the left without evidence of right heart strain. Venous ultrasound 10/19/2023: Nonocclusive DVT in the right common femoral, femoral, popliteal, and calf veins.  No evidence of DVT left lower extremity. Pulmonary thrombectomy 10/20/2023. Peripheral vascular thrombectomy 10/21/2023. CTA chest 01/04/2024: No acute PE.  Small amount of thin stranding in the intermedius pulmonary artery on the right the proximal right lower lobe pulmonary artery in the areas of the previously demonstrated large PE.  This is consistent with mild chronic PE sequelae.  Interval mild groundglass opacity throughout both lungs most likely due to pulmonary edema. Asthma. GERD. T2DM. Fibromyalgia.   In summary, patient was first evaluated by Dr. Hanson on 04/28/2016 for chest pain at the request of Dr. Jyl.  Patient had been evaluated in the ED with negative troponin and EKG showing poor R wave progression across  precordial leads.  Vascular evaluations including 4 catheterizations.  Prior records reviewed demonstrating no significant coronary disease in 1994 and 2000.  Normal exercise stress test in 2015.  History of at least 3 lower extremity DVTs with last episode 4 years prior treated with 6 months of Xarelto.  She did have a negative hypercoagulable workup.  In June 2021 she continued to complain of the same atypical chest pain symptoms.  She reported some improvement in symptoms with pantoprazole  but was concerned about the impact on her kidneys so she stopped it.  She underwent coronary CTA which showed coronary calcium score of 0 with minimal nonobstructive CAD.  Patient was seen in follow-up in October 2024 and continued to complain of chronic sharp, central chest pain radiating to the back unchanged for years.   Patient was last seen in the office by Dr. Hanson on 11/18/2023 for hospital follow-up.  She underwent hospital admission in April for chest pain and near syncope and found to have extensive bilateral PE as well as right lower extremity DVT.  She underwent pulmonary and right lower extremity thrombectomies and was discharged on apixaban .  Echo at the time of her hospital admission demonstrated EF 65 to 70%, no RWMA, Grade I DD, normal RV size/function, aortic valve sclerosis without stenosis.  At her office visit she complained of DOE worse when compared to 6 months prior.  She continued to have twinges of chest pain but nothing like what she experienced around the time of her PE.  She also reported some lower extremity edema for which she was restarted on hydrochlorothiazide .  Decision made to repeat echo in 2 months.  Patient underwent ED evaluation in  July 2025 for intermittent mid chest pain that started the evening prior with associated shortness of breath.  CTA chest demonstrated no acute PE and findings consistent with mild chronic PE sequelae.  Labs were unremarkable with negative troponin.  Patient  was provided with a short course of narcotic pain medication and discharged home.     History of Present Illness    Today, patient ***  Chronic Chest Pain Recurrent sharp chest pain, unchanged in quality for years. Coronary CTA in 2021 showed no calcium. Pain is suspected to be due to spasms. Patient*** -***   Chronic HFpEF/lower extremity edema*** Echo July 2025 showed normal LV/RV function, Grade I DD, normal PA pressure, no significant valvular abnormalities.  Patient*** - Consider use of compression socks during long car rides and prolonged standing.*** - Continue hydrochlorothiazide . - Add Lasix***  DVT/PE. History of multiple DVTs. No hypercoagulability identified on workup.  Patient underwent hospital admission in April 2025 and was found to have extensive bilateral PE as well as right lower extremity DVT for which she underwent pulmonary and right lower extremity thrombectomies.  Repeat CTA July 2025 demonstrated no acute PE and findings consistent with mild chronic PE sequelae.  Patient now on lifelong OAC with recommendation to continue uninterrupted for 6 months from April.*** -Continue Eliquis    Hypertension BP today***.  -Continue carvedilol , hydrochlorothiazide , losartan .    Hyperlipidemia LDL 84 April 2025. -Continue pravastatin  and Repatha.*** -Managed by PCP.   ROS: All other systems reviewed and are otherwise negative except as noted in History of Present Illness.  EKGs/Labs Reviewed        10/18/2023: ALT 27; AST 23 01/04/2024: BUN 22; Creatinine, Ser 0.95; Potassium 4.0; Sodium 141   01/04/2024: Hemoglobin 14.7; WBC 7.1   No results found for requested labs within last 365 days.   01/04/2024: B Natriuretic Peptide 31.0  ***  Risk Assessment/Calculations    {Does this patient have ATRIAL FIBRILLATION?:[phone]} No BP recorded.  {Refresh Note OR Click here to enter BP  :1}***        Physical Exam    VS:  LMP  (LMP Unknown)  , BMI There is no height  or weight on file to calculate BMI.  GEN: Well nourished, well developed, in no acute distress. Neck: No JVD or carotid bruits. Cardiac: *** RRR. *** No murmur. No rubs or gallops.   Respiratory:  Respirations regular and unlabored. Clear to auscultation without rales, wheezing or rhonchi. GI: Soft, nontender, nondistended. Extremities: Radials/DP/PT 2+ and equal bilaterally. No clubbing or cyanosis. No edema ***  Skin: Warm and dry, no rash. Neuro: Strength intact.  Assessment & Plan   ***  Disposition: ***     {Are you ordering a CV Procedure (e.g. stress test, cath, DCCV, TEE, etc)?   Press F2        :[id number]}   Signed, Barnie HERO. Nijah Tejera, DNP, NP-C

## 2024-03-01 ENCOUNTER — Encounter (INDEPENDENT_AMBULATORY_CARE_PROVIDER_SITE_OTHER): Payer: Self-pay | Admitting: Nurse Practitioner

## 2024-03-01 ENCOUNTER — Ambulatory Visit (INDEPENDENT_AMBULATORY_CARE_PROVIDER_SITE_OTHER): Admitting: Nurse Practitioner

## 2024-03-01 VITALS — BP 176/71 | HR 61 | Ht 63.0 in | Wt 233.4 lb

## 2024-03-01 DIAGNOSIS — I2699 Other pulmonary embolism without acute cor pulmonale: Secondary | ICD-10-CM

## 2024-03-01 DIAGNOSIS — E785 Hyperlipidemia, unspecified: Secondary | ICD-10-CM | POA: Diagnosis not present

## 2024-03-01 DIAGNOSIS — I10 Essential (primary) hypertension: Secondary | ICD-10-CM

## 2024-03-01 DIAGNOSIS — E1169 Type 2 diabetes mellitus with other specified complication: Secondary | ICD-10-CM

## 2024-03-02 ENCOUNTER — Ambulatory Visit: Admitting: Student

## 2024-03-06 ENCOUNTER — Encounter (INDEPENDENT_AMBULATORY_CARE_PROVIDER_SITE_OTHER): Payer: Self-pay | Admitting: Nurse Practitioner

## 2024-03-06 NOTE — Progress Notes (Signed)
 Subjective:    Patient ID: Misty Hebert, female    DOB: 12/01/1950, 73 y.o.   MRN: [id number] Chief Complaint  Patient presents with   Follow-up    fu 3 months no studies see JD/FB     The patient returns today following recent pulmonary thrombectomy on 10/20/2023 as well as right lower extremity thrombectomy on 10/21/2023.  Following her thrombectomy the patient notes that the chest pain is better than it was but she does continue to have shortness of breath with exertion.  She denies any pain in her lower extremity or any extensive swelling.  She initially did have swelling but after some medication changes it has been improved.  She does have some ongoing hip issues and does note some difficulty with walking and pain in her right hip as well.  She is currently on Eliquis  and is tolerating this well.  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  This is her first pulmonary embolism.  She notes that she had a daughter who passed with age of 42 from a previous pulmonary embolus.  Previous,  noninvasive studies show resolution of the DVT in the right lower extremity.  The patient began to experience some chest pain and went to the emergency room and at that time she had a CT scan which showed no acute pulmonary embolism and that the previous PE had progressed to a chronic state.    Review of Systems  All other systems reviewed and are negative.      Objective:   Physical Exam Vitals reviewed.  HENT:     Head: Normocephalic.  Cardiovascular:     Rate and Rhythm: Normal rate.  Pulmonary:     Effort: Pulmonary effort is normal.  Skin:    General: Skin is warm and dry.  Neurological:     Mental Status: She is alert and oriented to person, place, and time.  Psychiatric:        Mood and Affect: Mood normal.        Behavior: Behavior normal.        Thought Content: Thought content normal.        Judgment: Judgment normal.     BP (!) 176/71   Pulse 61   Ht 5' 3 (1.6 m)   Wt 233 lb  6.4 oz (105.9 kg)   LMP  (LMP Unknown)   BMI 41.34 kg/m   Past Medical History:  Diagnosis Date   Actinic keratosis    Arthritis of knee    Benign essential HTN    Chest pain    a. she reports 4 normal heart caths; b. 12/2013 [facility]: nl Avelina); c. 04/2016 MV: apical and ant defect @ rest consistent w/ breast attenuation. No ischemia. EF 55-65%.   Depression    Diverticulitis of intestine w/o perforation or abscess w/o bleeding    DVT (deep venous thrombosis) (HCC)    Dysphagia    Fibromyalgia    High cholesterol    History of colon resection    History of echocardiogram    a. 01/2017 Echo ([facility]): nl LV syst fxn.   Hyperlipidemia    Medial meniscus tear    Migraine    Neuromuscular disorder (HCC)    Obesity    Overactive bladder    Reflux    Subchondral insufficiency fracture of condyle of femur (HCC)    Tension headache     Social History   Socioeconomic History   Marital status: Married  Spouse name: Not on file   Number of children: Not on file   Years of education: Not on file   Highest education level: Not on file  Occupational History   Not on file  Tobacco Use   Smoking status: Never   Smokeless tobacco: Never  Vaping Use   Vaping status: Never Used  Substance and Sexual Activity   Alcohol use: No   Drug use: No   Sexual activity: Not Currently  Other Topics Concern   Not on file  Social History Narrative   Not on file   Social Drivers of Health   Financial Resource Strain: Low Risk  (12/31/2023)   Received from [facility]   Overall Financial Resource Strain (CARDIA)    Difficulty of Paying Living Expenses: Not hard at all  Food Insecurity: No Food Insecurity (12/31/2023)   Received from [facility]   Hunger Vital Sign    Within the past 12 months, you worried that your food would run out before you got the money to buy more.: Never true    Within the past 12 months, the food you bought just didn't last  and you didn't have money to get more.: Never true  Transportation Needs: No Transportation Needs (12/31/2023)   Received from [facility] - Transportation    In the past 12 months, has lack of transportation kept you from medical appointments or from getting medications?: No    Lack of Transportation (Non-Medical): No  Physical Activity: Not on file  Stress: Not on file  Social Connections: Moderately Isolated (10/19/2023)   Social Connection and Isolation Panel    Frequency of Communication with Friends and Family: More than three times a week    Frequency of Social Gatherings with Friends and Family: More than three times a week    Attends Religious Services: Never    Database administrator or Organizations: No    Attends Banker Meetings: Never    Marital Status: Married  Catering manager Violence: Not At Risk (12/13/2023)   Humiliation, Afraid, Rape, and Kick questionnaire    Fear of Current or Ex-Partner: No    Emotionally Abused: No    Physically Abused: No    Sexually Abused: No    Past Surgical History:  Procedure Laterality Date   ABDOMINAL HYSTERECTOMY     BREAST BIOPSY Right    CYST REMOVED-16-13YRS AGO-NEG   BREAST BIOPSY Left 03/24/2021   stereo bx of calcs UOQ anterior #1, coil marker, benign   BREAST BIOPSY Left 03/24/2021   stereo bx calcs UOQ posterior #2, x marker, benign   CARDIAC CATHETERIZATION  1995   [facility]   CARDIAC [facility]IZATION  2001   [facility]   CARDIAC CATHETERIZATION  2006   [facility]   CARDIAC CATHETERIZATION     [facility] with Dr. Hester    COLON RESECTION     ESOPHAGOGASTRODUODENOSCOPY (EGD) WITH PROPOFOL  N/A 02/06/2022   Procedure: ESOPHAGOGASTRODUODENOSCOPY (EGD) WITH PROPOFOL ;  Surgeon: Maryruth Ole DASEN, MD;  Location: [facility] ENDOSCOPY;  Service: Endoscopy;  Laterality: N/A;   FOOT SURGERY Left 01/10/2016   03/06/16 second foot surgery   KNEE SURGERY     PERIPHERAL VASCULAR  THROMBECTOMY Right 10/21/2023   Procedure: PERIPHERAL VASCULAR THROMBECTOMY;  Surgeon: Marea Selinda RAMAN, MD;  Location: [facility] INVASIVE CV LAB;  Service: Cardiovascular;  Laterality: Right;   PULMONARY THROMBECTOMY Bilateral 10/20/2023   Procedure: PULMONARY THROMBECTOMY;  Surgeon:  Marea Selinda RAMAN, MD;  Location: [facility] INVASIVE CV LAB;  Service: Cardiovascular;  Laterality: Bilateral;   REVISION TOTAL HIP ARTHROPLASTY     June 2019   SHOULDER SURGERY      Family History  Problem Relation Age of Onset   Heart attack Mother    Heart disease Mother        pacemaker/ICD   Heart attack Father    Stroke Father    Lung cancer Father    Heart disease Brother        stents in early 50's   Breast cancer Maternal Aunt 69   Leukemia Paternal Grandmother     Allergies  Allergen Reactions   Chlorpromazine Anaphylaxis and Other (See Comments)    Other Reaction: LOC Other Reaction: LOC Other Reaction: LOC Other Reaction: LOC    Metoclopramide Other (See Comments) and Anaphylaxis    Other Reaction: RESTLESS Twitching , cant sit still  Other Reaction: RESTLESS Other Reaction: RESTLESS Other Reaction: RESTLESS    Sulfamethizole Nausea And Vomiting and Other (See Comments)   Amlodipine      BLLE edema   Other Other (See Comments)    Torazine gave her syncope Upset stomach Upset stomach    Celecoxib Nausea Only and Anxiety   Meloxicam  Other (See Comments) and Diarrhea       Latest Ref Rng & Units 01/04/2024   11:12 AM 10/22/2023   12:20 AM 10/21/2023    4:05 AM  CBC  WBC 4.0 - 10.5 K/uL 7.1  7.7  7.6   Hemoglobin 12.0 - 15.0 g/dL 85.2  88.1  87.7   Hematocrit 36.0 - 46.0 % 44.1  33.4  34.5   Platelets 150 - 400 K/uL 211  155  162       CMP     Component Value Date/Time   NA 141 01/04/2024 1112   NA 141 05/07/2021 1018   NA 137 10/26/2014 2240   K 4.0 01/04/2024 1112   K 4.0 10/26/2014 2240   CL 103 01/04/2024 1112   CL 102 10/26/2014 2240   CO2 28 01/04/2024 1112   CO2 25  10/26/2014 2240   GLUCOSE 157 (H) 01/04/2024 1112   GLUCOSE 174 (H) 10/26/2014 2240   BUN 22 01/04/2024 1112   BUN 11 05/07/2021 1018   BUN 13 10/26/2014 2240   CREATININE 0.95 01/04/2024 1112   CREATININE 1.02 (H) 10/26/2014 2240   CALCIUM 9.2 01/04/2024 1112   CALCIUM 9.1 10/26/2014 2240   PROT 6.4 (L) 10/18/2023 2219   PROT 6.1 09/09/2020 1513   PROT 7.4 10/26/2014 2240   ALBUMIN 3.6 10/18/2023 2219   ALBUMIN 4.0 09/09/2020 1513   ALBUMIN 4.2 10/26/2014 2240   AST 23 10/18/2023 2219   AST 27 10/26/2014 2240   ALT 27 10/18/2023 2219   ALT 25 10/26/2014 2240   ALKPHOS 77 10/18/2023 2219   ALKPHOS 127 (H) 10/26/2014 2240   BILITOT 1.1 10/18/2023 2219   BILITOT 0.5 09/09/2020 1513   BILITOT 0.4 10/26/2014 2240   EGFR 76 05/07/2021 1018   GFRNONAA >60 01/04/2024 1112   GFRNONAA 58 (L) 10/26/2014 2240     No results found.     Assessment & Plan:   1. Bilateral pulmonary embolism (HCC) (Primary) Recommend:   No surgery or intervention at this point in time.  IVC filter is not indicated at present.  Patient's duplex ultrasound of the venous system shows resolution of right lower extremity DVT  The patient is on anticoagulation and  given her history of pulmonary embolism as well as multiple DVTs, it is recommended that she remain on lifelong anticoagulation.  Elevation was stressed, such as the use of a recliner.    2. Essential hypertension No family history of bleeding/clotting disorders, porphyria or autoimmune disease  3. Hyperlipidemia associated with type 2 diabetes mellitus (HCC) Continue statin as ordered and reviewed, no changes at this time   Current Outpatient Medications on File Prior to Visit  Medication Sig Dispense Refill   carvedilol  (COREG ) 6.25 MG tablet Take 6.25 mg by mouth 2 (two) times daily with a meal.     cyanocobalamin (VITAMIN B12) 1000 MCG/ML injection Inject 1,000 mcg into the muscle every 30 (thirty) days.     dicyclomine  (BENTYL )  10 MG capsule Take 10 mg by mouth 2 (two) times daily as needed.     ELIQUIS  5 MG TABS tablet Take 5 mg by mouth every 12 (twelve) hours.     ezetimibe  (ZETIA ) 10 MG tablet Take 10 mg by mouth daily.     [Paused] hydrochlorothiazide  (HYDRODIURIL ) 25 MG tablet Take 1 tablet (25 mg total) by mouth daily. 90 tablet 3   [Paused] losartan  (COZAAR ) 100 MG tablet Take 50 mg by mouth daily.     nitroGLYCERIN  (NITROSTAT ) 0.4 MG SL tablet Place 1 tablet (0.4 mg total) under the tongue every 5 (five) minutes as needed for chest pain (maximum of 3 doses.). 25 tablet 2   omeprazole (PRILOSEC) 40 MG capsule Take 20 mg by mouth daily.     pravastatin  (PRAVACHOL ) 80 MG tablet Take 1 tablet (80 mg total) by mouth daily. 90 tablet 3   Semaglutide,0.25 or 0.5MG /DOS, (OZEMPIC, 0.25 OR 0.5 MG/DOSE,) 2 MG/1.5ML SOPN Inject 0.5 mg into the skin once a week.     bimatoprost  (LATISSE ) 0.03 % ophthalmic solution Place 1 Application into both eyes at bedtime. Place one drop on applicator and apply evenly along the skin of the upper eyelid at base of eyelashes once daily at bedtime; repeat procedure for second eye (use a clean applicator). (Patient not taking: Reported on 03/01/2024) 3 mL 6   bimatoprost  (LUMIGAN ) 0.01 % SOLN Apply to eyelids at bedtime (Patient not taking: Reported on 03/01/2024) 3 mL 6   fluticasone  (FLONASE ) 50 MCG/ACT nasal spray Place 2 sprays into both nostrils daily. (Patient not taking: Reported on 03/01/2024) 16 mL 0   HYDROcodone -acetaminophen  (NORCO/VICODIN) 5-325 MG tablet Take 1 tablet by mouth every 4 (four) hours as needed. (Patient not taking: Reported on 03/01/2024) 15 tablet 0   REPATHA SURECLICK 140 MG/ML SOAJ Inject 140 mg into the skin every 14 (fourteen) days. (Patient not taking: Reported on 03/01/2024)     No current facility-administered medications on file prior to visit.    There are no Patient Instructions on file for this visit. No follow-ups on file.   Brigg [place] E Breslyn Abdo, NP

## 2024-06-05 ENCOUNTER — Other Ambulatory Visit: Payer: Self-pay | Admitting: Family Medicine

## 2024-06-05 DIAGNOSIS — Z1231 Encounter for screening mammogram for malignant neoplasm of breast: Secondary | ICD-10-CM

## 2024-07-12 ENCOUNTER — Ambulatory Visit
Admission: RE | Admit: 2024-07-12 | Discharge: 2024-07-12 | Disposition: A | Discharge status: Home / Self Care | Source: Ambulatory Visit | Attending: Family Medicine | Admitting: Family Medicine

## 2024-07-12 DIAGNOSIS — Z1231 Encounter for screening mammogram for malignant neoplasm of breast: Secondary | ICD-10-CM | POA: Insufficient documentation

## 2024-12-13 ENCOUNTER — Ambulatory Visit: Admitting: Dermatology

## 2025-03-02 ENCOUNTER — Ambulatory Visit (INDEPENDENT_AMBULATORY_CARE_PROVIDER_SITE_OTHER): Admitting: Nurse Practitioner

## 2025-03-02 ENCOUNTER — Encounter (INDEPENDENT_AMBULATORY_CARE_PROVIDER_SITE_OTHER)
